# Patient Record
Sex: Female | Born: 1935
Health system: Southern US, Community
[De-identification: ages and names within clinical notes are randomized; demographics above are authoritative.]

## PROBLEM LIST (undated history)

## (undated) DIAGNOSIS — E785 Hyperlipidemia, unspecified: Secondary | ICD-10-CM

## (undated) DIAGNOSIS — Z9889 Other specified postprocedural states: Secondary | ICD-10-CM

## (undated) DIAGNOSIS — E559 Vitamin D deficiency, unspecified: Secondary | ICD-10-CM

## (undated) DIAGNOSIS — K644 Residual hemorrhoidal skin tags: Secondary | ICD-10-CM

## (undated) DIAGNOSIS — M1990 Unspecified osteoarthritis, unspecified site: Secondary | ICD-10-CM

## (undated) DIAGNOSIS — K625 Hemorrhage of anus and rectum: Secondary | ICD-10-CM

## (undated) DIAGNOSIS — H548 Legal blindness, as defined in USA: Secondary | ICD-10-CM

## (undated) DIAGNOSIS — Z9079 Acquired absence of other genital organ(s): Secondary | ICD-10-CM

## (undated) DIAGNOSIS — C801 Malignant (primary) neoplasm, unspecified: Secondary | ICD-10-CM

## (undated) DIAGNOSIS — M949 Disorder of cartilage, unspecified: Secondary | ICD-10-CM

## (undated) DIAGNOSIS — K5900 Constipation, unspecified: Secondary | ICD-10-CM

## (undated) DIAGNOSIS — R609 Edema, unspecified: Secondary | ICD-10-CM

## (undated) DIAGNOSIS — D649 Anemia, unspecified: Secondary | ICD-10-CM

## (undated) DIAGNOSIS — K219 Gastro-esophageal reflux disease without esophagitis: Secondary | ICD-10-CM

## (undated) DIAGNOSIS — M899 Disorder of bone, unspecified: Secondary | ICD-10-CM

## (undated) DIAGNOSIS — H547 Unspecified visual loss: Secondary | ICD-10-CM

## (undated) HISTORY — DX: Anemia, unspecified: D64.9

## (undated) HISTORY — DX: Gastro-esophageal reflux disease without esophagitis: K21.9

## (undated) HISTORY — PX: OTHER SURGICAL HISTORY: SHX169

## (undated) HISTORY — DX: Acquired absence of other genital organ(s): Z90.79

## (undated) HISTORY — DX: Disorder of cartilage, unspecified: M94.9

## (undated) HISTORY — PX: ROTATOR CUFF REPAIR: SHX139

## (undated) HISTORY — DX: Vitamin D deficiency, unspecified: E55.9

## (undated) HISTORY — DX: Legal blindness, as defined in USA: H54.8

## (undated) HISTORY — DX: Constipation, unspecified: K59.00

## (undated) HISTORY — DX: Disorder of bone, unspecified: M89.9

## (undated) HISTORY — DX: Other specified postprocedural states: Z98.89

## (undated) HISTORY — DX: Unspecified osteoarthritis, unspecified site: M19.90

## (undated) HISTORY — PX: TUBAL LIGATION: SHX77

## (undated) HISTORY — DX: Malignant (primary) neoplasm, unspecified: C80.1

## (undated) HISTORY — PX: RIGHT OOPHORECTOMY: SHX2359

## (undated) HISTORY — DX: Hemorrhage of anus and rectum: K62.5

## (undated) HISTORY — DX: Hyperlipidemia, unspecified: E78.5

## (undated) HISTORY — DX: Residual hemorrhoidal skin tags: K64.4

## (undated) HISTORY — PX: ABDOMINAL HYSTERECTOMY: SHX81

---

## 1999-01-09 ENCOUNTER — Other Ambulatory Visit: Admission: RE | Admit: 1999-01-09 | Discharge: 1999-01-09 | Payer: Self-pay | Admitting: Obstetrics and Gynecology

## 1999-01-16 ENCOUNTER — Ambulatory Visit (HOSPITAL_COMMUNITY): Admission: RE | Admit: 1999-01-16 | Discharge: 1999-01-16 | Payer: Self-pay | Admitting: Obstetrics and Gynecology

## 1999-01-16 ENCOUNTER — Encounter: Payer: Self-pay | Admitting: Obstetrics and Gynecology

## 1999-01-24 ENCOUNTER — Other Ambulatory Visit: Admission: RE | Admit: 1999-01-24 | Discharge: 1999-01-24 | Payer: Self-pay | Admitting: Obstetrics and Gynecology

## 1999-01-24 ENCOUNTER — Encounter (INDEPENDENT_AMBULATORY_CARE_PROVIDER_SITE_OTHER): Payer: Self-pay | Admitting: Specialist

## 2000-10-06 ENCOUNTER — Other Ambulatory Visit: Admission: RE | Admit: 2000-10-06 | Discharge: 2000-10-06 | Payer: Self-pay | Admitting: Obstetrics and Gynecology

## 2000-11-02 ENCOUNTER — Encounter: Payer: Self-pay | Admitting: Gastroenterology

## 2000-11-02 LAB — HM COLONOSCOPY

## 2001-06-21 ENCOUNTER — Inpatient Hospital Stay (HOSPITAL_COMMUNITY): Admission: RE | Admit: 2001-06-21 | Discharge: 2001-06-22 | Payer: Self-pay | Admitting: Orthopaedic Surgery

## 2004-08-31 ENCOUNTER — Emergency Department (HOSPITAL_COMMUNITY): Admission: EM | Admit: 2004-08-31 | Discharge: 2004-08-31 | Payer: Self-pay | Admitting: *Deleted

## 2004-09-03 ENCOUNTER — Emergency Department (HOSPITAL_COMMUNITY): Admission: EM | Admit: 2004-09-03 | Discharge: 2004-09-03 | Payer: Self-pay | Admitting: Emergency Medicine

## 2004-11-01 ENCOUNTER — Ambulatory Visit: Payer: Self-pay | Admitting: Internal Medicine

## 2004-11-05 ENCOUNTER — Ambulatory Visit: Payer: Self-pay | Admitting: Internal Medicine

## 2004-11-27 ENCOUNTER — Ambulatory Visit: Payer: Self-pay | Admitting: Internal Medicine

## 2004-12-10 ENCOUNTER — Ambulatory Visit: Payer: Self-pay | Admitting: Internal Medicine

## 2005-06-02 ENCOUNTER — Ambulatory Visit: Payer: Self-pay | Admitting: Internal Medicine

## 2005-08-22 HISTORY — PX: IRRIGATION AND DEBRIDEMENT SEBACEOUS CYST: SHX5255

## 2005-08-24 ENCOUNTER — Emergency Department (HOSPITAL_COMMUNITY): Admission: EM | Admit: 2005-08-24 | Discharge: 2005-08-24 | Payer: Self-pay | Admitting: Emergency Medicine

## 2005-08-25 ENCOUNTER — Ambulatory Visit: Payer: Self-pay | Admitting: Internal Medicine

## 2005-09-21 HISTORY — PX: OTHER SURGICAL HISTORY: SHX169

## 2005-10-07 ENCOUNTER — Ambulatory Visit: Payer: Self-pay

## 2005-12-02 ENCOUNTER — Ambulatory Visit: Payer: Self-pay | Admitting: Internal Medicine

## 2006-08-11 ENCOUNTER — Ambulatory Visit: Payer: Self-pay | Admitting: Internal Medicine

## 2006-08-11 LAB — CONVERTED CEMR LAB
ALT: 16 units/L (ref 0–40)
AST: 21 units/L (ref 0–37)
BUN: 19 mg/dL (ref 6–23)
Cholesterol: 262 mg/dL (ref 0–200)
Creatinine, Ser: 0.9 mg/dL (ref 0.4–1.2)
Direct LDL: 157.7 mg/dL
Glucose, Bld: 99 mg/dL (ref 70–99)
HDL: 53.9 mg/dL (ref >39.0)
Total CHOL/HDL Ratio: 4.9
Triglycerides: 164 mg/dL — ABNORMAL HIGH (ref 0–149)
VLDL: 33 mg/dL (ref 0–40)

## 2006-11-20 ENCOUNTER — Ambulatory Visit: Payer: Self-pay | Admitting: Internal Medicine

## 2006-12-14 DIAGNOSIS — Z9079 Acquired absence of other genital organ(s): Secondary | ICD-10-CM | POA: Insufficient documentation

## 2006-12-14 DIAGNOSIS — K5900 Constipation, unspecified: Secondary | ICD-10-CM

## 2006-12-14 DIAGNOSIS — Z9889 Other specified postprocedural states: Secondary | ICD-10-CM

## 2006-12-14 DIAGNOSIS — H548 Legal blindness, as defined in USA: Secondary | ICD-10-CM

## 2006-12-14 DIAGNOSIS — E785 Hyperlipidemia, unspecified: Secondary | ICD-10-CM | POA: Insufficient documentation

## 2006-12-14 DIAGNOSIS — K649 Unspecified hemorrhoids: Secondary | ICD-10-CM | POA: Insufficient documentation

## 2006-12-14 HISTORY — DX: Acquired absence of other genital organ(s): Z90.79

## 2006-12-14 HISTORY — DX: Legal blindness, as defined in USA: H54.8

## 2006-12-14 HISTORY — DX: Constipation, unspecified: K59.00

## 2006-12-14 HISTORY — DX: Hyperlipidemia, unspecified: E78.5

## 2006-12-14 HISTORY — DX: Other specified postprocedural states: Z98.89

## 2007-01-12 LAB — HM MAMMOGRAPHY: HM Mammogram: NORMAL

## 2007-05-21 ENCOUNTER — Ambulatory Visit: Payer: Self-pay | Admitting: Internal Medicine

## 2007-07-16 ENCOUNTER — Ambulatory Visit: Payer: Self-pay | Admitting: Internal Medicine

## 2007-07-16 DIAGNOSIS — R7989 Other specified abnormal findings of blood chemistry: Secondary | ICD-10-CM | POA: Insufficient documentation

## 2007-07-16 LAB — CONVERTED CEMR LAB
AST: 22 units/L (ref 0–37)
Alkaline Phosphatase: 61 units/L (ref 39–117)
Bilirubin, Direct: 0.1 mg/dL (ref 0.0–0.3)
Chloride: 108 meq/L (ref 96–112)
GFR calc non Af Amer: 75 mL/min
LDL Cholesterol: 104 mg/dL — ABNORMAL HIGH (ref 0–99)
Potassium: 3.9 meq/L (ref 3.5–5.1)
Sodium: 143 meq/L (ref 135–145)
Total Bilirubin: 0.8 mg/dL (ref 0.3–1.2)
Total CHOL/HDL Ratio: 3.3

## 2007-07-20 ENCOUNTER — Encounter: Payer: Self-pay | Admitting: Internal Medicine

## 2007-08-19 ENCOUNTER — Telehealth: Payer: Self-pay | Admitting: Gastroenterology

## 2007-09-30 DIAGNOSIS — K644 Residual hemorrhoidal skin tags: Secondary | ICD-10-CM

## 2007-09-30 HISTORY — DX: Residual hemorrhoidal skin tags: K64.4

## 2007-10-01 ENCOUNTER — Ambulatory Visit: Payer: Self-pay | Admitting: Gastroenterology

## 2007-10-01 DIAGNOSIS — K219 Gastro-esophageal reflux disease without esophagitis: Secondary | ICD-10-CM

## 2007-10-01 DIAGNOSIS — K625 Hemorrhage of anus and rectum: Secondary | ICD-10-CM

## 2007-10-01 HISTORY — DX: Hemorrhage of anus and rectum: K62.5

## 2007-10-01 HISTORY — DX: Gastro-esophageal reflux disease without esophagitis: K21.9

## 2007-10-01 LAB — CONVERTED CEMR LAB
Eosinophils Absolute: 0.1 10*3/uL (ref 0.0–0.7)
Eosinophils Relative: 2.1 % (ref 0.0–5.0)
Folate: >20 ng/mL
HCT: 33.8 % — ABNORMAL LOW (ref 36.0–46.0)
MCV: 85.1 fL (ref 78.0–100.0)
Monocytes Absolute: 0.4 10*3/uL (ref 0.1–1.0)
Platelets: 249 10*3/uL (ref 150–400)
RDW: 14.2 % (ref 11.5–14.6)
Transferrin: 220.3 mg/dL (ref >=212.0)
WBC: 4.8 10*3/uL (ref 4.5–10.5)

## 2007-10-13 ENCOUNTER — Encounter: Payer: Self-pay | Admitting: Gastroenterology

## 2007-10-13 ENCOUNTER — Ambulatory Visit: Payer: Self-pay | Admitting: Gastroenterology

## 2007-10-14 ENCOUNTER — Encounter: Payer: Self-pay | Admitting: Gastroenterology

## 2007-10-26 ENCOUNTER — Encounter: Payer: Self-pay | Admitting: Gastroenterology

## 2007-10-27 ENCOUNTER — Telehealth: Payer: Self-pay | Admitting: Gastroenterology

## 2007-11-16 ENCOUNTER — Telehealth: Payer: Self-pay | Admitting: Gastroenterology

## 2007-11-30 ENCOUNTER — Encounter: Payer: Self-pay | Admitting: Gastroenterology

## 2008-01-13 ENCOUNTER — Encounter: Payer: Self-pay | Admitting: Internal Medicine

## 2008-02-07 ENCOUNTER — Ambulatory Visit: Payer: Self-pay | Admitting: Internal Medicine

## 2008-02-07 DIAGNOSIS — M899 Disorder of bone, unspecified: Secondary | ICD-10-CM

## 2008-02-07 DIAGNOSIS — M949 Disorder of cartilage, unspecified: Secondary | ICD-10-CM

## 2008-02-07 HISTORY — DX: Disorder of bone, unspecified: M89.9

## 2008-02-07 LAB — CONVERTED CEMR LAB
Alkaline Phosphatase: 65 units/L (ref 39–117)
Basophils Absolute: 0.1 10*3/uL (ref 0.0–0.1)
Bilirubin, Direct: 0.1 mg/dL (ref 0.0–0.3)
Eosinophils Absolute: 0.1 10*3/uL (ref 0.0–0.7)
HDL: 52.7 mg/dL (ref >39.0)
MCHC: 32.8 g/dL (ref 30.0–36.0)
MCV: 86.7 fL (ref 78.0–100.0)
Neutrophils Relative %: 49.6 % (ref 43.0–77.0)
Platelets: 214 10*3/uL (ref 150–400)
RDW: 14.1 % (ref 11.5–14.6)
Total Bilirubin: 0.5 mg/dL (ref 0.3–1.2)
VLDL: 21 mg/dL (ref 0–40)
WBC: 5.6 10*3/uL (ref 4.5–10.5)

## 2008-02-08 ENCOUNTER — Telehealth: Payer: Self-pay | Admitting: Internal Medicine

## 2008-02-08 ENCOUNTER — Encounter: Payer: Self-pay | Admitting: Internal Medicine

## 2008-05-20 ENCOUNTER — Observation Stay (HOSPITAL_COMMUNITY): Admission: EM | Admit: 2008-05-20 | Discharge: 2008-05-21 | Payer: Self-pay | Admitting: Emergency Medicine

## 2008-06-22 ENCOUNTER — Telehealth (INDEPENDENT_AMBULATORY_CARE_PROVIDER_SITE_OTHER): Payer: Self-pay | Admitting: *Deleted

## 2008-06-26 ENCOUNTER — Telehealth: Payer: Self-pay | Admitting: Internal Medicine

## 2008-06-28 ENCOUNTER — Telehealth: Payer: Self-pay | Admitting: Internal Medicine

## 2008-07-04 ENCOUNTER — Telehealth: Payer: Self-pay | Admitting: Internal Medicine

## 2008-08-01 ENCOUNTER — Ambulatory Visit: Payer: Self-pay | Admitting: Internal Medicine

## 2008-08-01 LAB — CONVERTED CEMR LAB
HDL: 53.6 mg/dL (ref >39.00)
Total Bilirubin: 0.5 mg/dL (ref 0.3–1.2)
Total CHOL/HDL Ratio: 4
VLDL: 27.6 mg/dL (ref 0.0–40.0)

## 2008-08-22 ENCOUNTER — Encounter: Payer: Self-pay | Admitting: Internal Medicine

## 2008-10-09 ENCOUNTER — Telehealth: Payer: Self-pay | Admitting: Internal Medicine

## 2008-11-08 ENCOUNTER — Ambulatory Visit (HOSPITAL_COMMUNITY): Admission: RE | Admit: 2008-11-08 | Discharge: 2008-11-08 | Payer: Self-pay | Admitting: Obstetrics and Gynecology

## 2008-11-08 ENCOUNTER — Encounter (INDEPENDENT_AMBULATORY_CARE_PROVIDER_SITE_OTHER): Payer: Self-pay | Admitting: Obstetrics and Gynecology

## 2008-11-22 ENCOUNTER — Encounter: Payer: Self-pay | Admitting: Internal Medicine

## 2009-01-18 ENCOUNTER — Encounter: Payer: Self-pay | Admitting: Internal Medicine

## 2009-01-24 ENCOUNTER — Telehealth: Payer: Self-pay | Admitting: Internal Medicine

## 2009-01-25 DIAGNOSIS — E559 Vitamin D deficiency, unspecified: Secondary | ICD-10-CM

## 2009-01-25 HISTORY — DX: Vitamin D deficiency, unspecified: E55.9

## 2009-02-06 ENCOUNTER — Ambulatory Visit: Payer: Self-pay | Admitting: Internal Medicine

## 2009-02-06 LAB — CONVERTED CEMR LAB
ALT: 15 units/L (ref 0–35)
Albumin: 3.9 g/dL (ref 3.5–5.2)
Alkaline Phosphatase: 66 units/L (ref 39–117)
Basophils Absolute: 0.1 10*3/uL (ref 0.0–0.1)
CO2: 28 meq/L (ref 19–32)
Calcium, Total (PTH): 9.2 mg/dL (ref 8.4–10.5)
Chloride: 107 meq/L (ref 96–112)
Cholesterol: 193 mg/dL (ref 0–200)
Creatinine, Ser: 0.8 mg/dL (ref 0.4–1.2)
Hemoglobin: 11.4 g/dL — ABNORMAL LOW (ref 12.0–15.0)
LDL Cholesterol: 113 mg/dL — ABNORMAL HIGH (ref 0–99)
Lymphocytes Relative: 38.2 % (ref 12.0–46.0)
Monocytes Relative: 5.8 % (ref 3.0–12.0)
Neutro Abs: 2.5 10*3/uL (ref 1.4–7.7)
Neutrophils Relative %: 53.1 % (ref 43.0–77.0)
PTH: 68.5 pg/mL (ref 14.0–72.0)
Potassium: 4.1 meq/L (ref 3.5–5.1)
RBC: 3.87 M/uL (ref 3.87–5.11)
RDW: 13.4 % (ref 11.5–14.6)
Total Protein: 7.3 g/dL (ref 6.0–8.3)
Triglycerides: 119 mg/dL (ref 0.0–149.0)
VLDL: 23.8 mg/dL (ref 0.0–40.0)

## 2009-02-08 ENCOUNTER — Encounter: Payer: Self-pay | Admitting: Internal Medicine

## 2009-03-12 ENCOUNTER — Ambulatory Visit: Payer: Self-pay | Admitting: Endocrinology

## 2009-03-12 LAB — CONVERTED CEMR LAB: Vit D, 25-Hydroxy: 26 ng/mL — ABNORMAL LOW (ref 30–89)

## 2009-06-01 ENCOUNTER — Telehealth: Payer: Self-pay | Admitting: Endocrinology

## 2009-06-07 ENCOUNTER — Ambulatory Visit: Payer: Self-pay | Admitting: Endocrinology

## 2009-06-08 LAB — CONVERTED CEMR LAB: Vit D, 25-Hydroxy: 30 ng/mL (ref 30–89)

## 2009-09-04 ENCOUNTER — Ambulatory Visit: Payer: Self-pay | Admitting: Internal Medicine

## 2009-09-04 LAB — CONVERTED CEMR LAB
AST: 24 units/L (ref 0–37)
Albumin: 4.4 g/dL (ref 3.5–5.2)
Basophils Relative: 0.7 % (ref 0.0–3.0)
CO2: 29 meq/L (ref 19–32)
Calcium: 9.7 mg/dL (ref 8.4–10.5)
Chloride: 105 meq/L (ref 96–112)
Cholesterol: 210 mg/dL — ABNORMAL HIGH (ref 0–200)
Creatinine, Ser: 0.9 mg/dL (ref 0.4–1.2)
Direct LDL: 133.2 mg/dL
Eosinophils Relative: 1.6 % (ref 0.0–5.0)
HDL: 66.6 mg/dL (ref >39.00)
Lymphocytes Relative: 32.1 % (ref 12.0–46.0)
MCV: 85.1 fL (ref 78.0–100.0)
Monocytes Relative: 7.8 % (ref 3.0–12.0)
Neutrophils Relative %: 57.8 % (ref 43.0–77.0)
Platelets: 236 10*3/uL (ref 150.0–400.0)
RBC: 4.06 M/uL (ref 3.87–5.11)
Sodium: 142 meq/L (ref 135–145)
TSH: 0.66 microintl units/mL (ref 0.35–5.50)
Total Bilirubin: 0.5 mg/dL (ref 0.3–1.2)
Total CHOL/HDL Ratio: 3
Triglycerides: 122 mg/dL (ref 0.0–149.0)
WBC: 5.7 10*3/uL (ref 4.5–10.5)

## 2010-02-13 ENCOUNTER — Encounter: Payer: Self-pay | Admitting: Internal Medicine

## 2010-04-23 NOTE — Assessment & Plan Note (Signed)
Summary: CPX/MEDICARE/#/CD   Vital Signs:  Patient profile:   77 year old female Height:      63 inches Weight:      185 pounds BMI:     32.89 O2 Sat:      97 % on Room air Temp:     97.2 degrees F oral Pulse rate:   88 / minute BP sitting:   102 / 70  (left arm) Cuff size:   large  Vitals Entered By: Bill Salinas CMA (September 04, 2009 1:42 PM)  O2 Flow:  Room air  Primary Care Provider:  Illene Regulus, M.D.   History of Present Illness: Patient presents for annual exam. She saw Dr. Ambrose Mantle last year. She usually gets her mammogram in the fall. She has been feeling well. No report of falls at home, although she does have leg pain. She will have pain in the distal LE left when she lays on the left side. No interval illness or sickness otherwise.   Current Medications (verified): 1)  Pravastatin Sodium 40 Mg Tabs (Pravastatin Sodium) .Marland Kitchen.. 1 At Bedtime 2)  Ranitidine Hcl 150 Mg Tabs (Ranitidine Hcl) .... Take 1 Tablet By Mouth Two Times A Day 3)  Hydrochlorothiazide 25 Mg  Tabs (Hydrochlorothiazide) .Marland Kitchen.. 1 Tablet By Mouth As Needed 4)  Allegra 180 Mg  Tabs (Fexofenadine Hcl) .Marland Kitchen.. 1 Tablet By Mouth As Needed 5)  Advil 200 Mg  Tabs (Ibuprofen) .Marland Kitchen.. 1 Tablet By Mouth As Needed 6)  Nexium 40 Mg  Cpdr (Esomeprazole Magnesium) .Marland Kitchen.. 1 Tablet By Mouth As Needed  Allergies (verified): No Known Drug Allergies  Past History:  Past Medical History: Last updated: 07/16/2007 CONSTIPATION (ICD-564.00) HEMORRHOIDS (ICD-455.6) HYPERLIPIDEMIA (ICD-272.4) BLINDNESS, LEGAL, [place] DEFINITION (ICD-369.4)  LE Art Doppler '98 - normal Nuc Stres test July '07 - normal study    Past Surgical History: Last updated: 10/01/2007 ROTATOR CUFF REPAIR, RIGHT, HX OF (ICD-V45.89) OOPHORECTOMY, RIGHT, HX OF (ICD-V45.77) Infected sebaceous cyst drained June '07 (gerkin) Tubal Ligation  Family History: Last updated: 04/09/09 father - deceased @ 64: homocide mother - deceased @71 : "nautral causes," HTN,  dementia Neg-breast or colon cancer, CAD dtr is vitamin-d deficient  Social History: Last updated: 10/01/2007 HSG married '61 3 sons - '62, '64, '65; 2 daughters- '60, '66: 5 grandchildren lives with husband support family-close at hand Patient has never smoked.  Alcohol Use - no  Risk Factors: Exercise: no (07/16/2007)  Risk Factors: Smoking Status: never (10/01/2007)  Review of Systems       The patient complains of peripheral edema.  The patient denies anorexia, fever, weight loss, weight gain, decreased hearing, hoarseness, chest pain, syncope, dyspnea on exertion, prolonged cough, hemoptysis, abdominal pain, severe indigestion/heartburn, incontinence, muscle weakness, difficulty walking, unusual weight change, enlarged lymph nodes, and angioedema.    Physical Exam  General:  overweight AA female in no distress Head:  normocephalic, atraumatic, and no abnormalities observed.   Eyes:  blind Ears:  hearing is normal Mouth:  native dentition in good repair. No oral lesions Neck:  supple, full ROM, and no thyromegaly.   Chest Wall:  no deformities and no tenderness.   Breasts:  deferred to gyn Lungs:  normal respiratory effort, no intercostal retractions, normal breath sounds, no crackles, and no wheezes.   Heart:  normal rate, regular rhythm, no murmur, no gallop, and no JVD.   Abdomen:  soft, non-tender, normal bowel sounds, no guarding, and no rigidity.   Genitalia:  deferred to gyn Msk:  no joint  tenderness, no joint swelling, no joint warmth, no redness over joints, and no joint deformities.   Pulses:  2+ radial pulses; 1+ DP pulses Extremities:  No clubbing, cyanosis, edema, or deformity noted with normal full range of motion of all joints.   Neurologic:  alert & oriented X3, cranial nerves II-XII intact, strength normal in all extremities, and gait normal.   Skin:  turgor normal, color normal, no rashes, no ecchymoses, and no ulcerations.   Cervical Nodes:  no  anterior cervical adenopathy and no posterior cervical adenopathy.   Psych:  Oriented X3, memory intact for recent and remote, and normally interactive.     Impression & Recommendations:  Problem # 1:  HYPERLIPIDEMIA (ICD-272.4) Due for routine labs with recommendations to follow.  Her updated medication list for this problem includes:    Simvastatin 40 Mg Tabs (Simvastatin) .Marland Kitchen... 1 by mouth qpm  Orders: TLB-Lipid Panel (80061-LIPID) TLB-Hepatic/Liver Function Pnl (80076-HEPATIC) TLB-TSH (Thyroid Stimulating Hormone) (84443-TSH)  Addendum - LDL 133.2 - close to goal of 130 or less  Plan - will change to simvastatin 40mg  at next Rx refill.  Problem # 2:  Preventive Health Care (ICD-V70.0) Patient is current with gyn. s due for mammogram with last study in '08 - she is made aware of the need to schedule study. She is current with colonoscopy with last study in '09. She is current for pneumonia vaccine and is a candidate for shingles vaccine.   She has a stable home environement and a very supportive family. She has increased fall risk due to blindness but has been doing well. She does not get adequate exercise but does the best she can.  In summary - a very nice woman who is medically stable. She will return as needed or 1 year.   Complete Medication List: 1)  Simvastatin 40 Mg Tabs (Simvastatin) .Marland Kitchen.. 1 by mouth qpm 2)  Ranitidine Hcl 150 Mg Tabs (Ranitidine hcl) .... Take 1 tablet by mouth two times a day 3)  Hydrochlorothiazide 25 Mg Tabs (Hydrochlorothiazide) .Marland Kitchen.. 1 tablet by mouth as needed 4)  Allegra 180 Mg Tabs (Fexofenadine hcl) .Marland Kitchen.. 1 tablet by mouth as needed 5)  Advil 200 Mg Tabs (Ibuprofen) .Marland Kitchen.. 1 tablet by mouth as needed 6)  Nexium 40 Mg Cpdr (Esomeprazole magnesium) .Marland Kitchen.. 1 tablet by mouth as needed  Other Orders: Subsequent annual wellness visit with prevention plan (Z6109) TLB-CBC Platelet - w/Differential (85025-CBCD) TLB-BMP (Basic Metabolic Panel-BMET)  (80048-METABOL)  Patient: [name] Note: All result statuses are Final unless otherwise noted.  Tests: (1) CBC Platelet w/Diff (CBCD)   White Cell Count          5.7 K/uL                    4.5-10.5   Red Cell Count            4.06 Mil/uL                 3.87-5.11   Hemoglobin           [L]  11.8 g/dL                   60.4-54.0   Hematocrit           [L]  34.6 %                      36.0-46.0   MCV  85.1 fl                     78.0-100.0   MCHC                      34.0 g/dL                   16.1-09.6   RDW                       14.6 %                      11.5-14.6   Platelet Count            236.0 K/uL                  150.0-400.0   Neutrophil %              57.8 %                      43.0-77.0   Lymphocyte %              32.1 %                      12.0-46.0   Monocyte %                7.8 %                       3.0-12.0   Eosinophils%              1.6 %                       0.0-5.0   Basophils %               0.7 %                       0.0-3.0   Neutrophill Absolute      3.3 K/uL                    1.4-7.7   Lymphocyte Absolute       1.8 K/uL                    0.7-4.0   Monocyte Absolute         0.4 K/uL                    0.1-1.0  Eosinophils, Absolute                             0.1 K/uL                    0.0-0.7   Basophils Absolute        0.0 K/uL                    0.0-0.1  Tests: (2) Lipid Panel (LIPID)   Cholesterol          [H]  210 mg/dL                   0-454     ATP III Classification  Desirable:  < 200 mg/dL                    Borderline High:  200 - 239 mg/dL               High:  > = 240 mg/dL   Triglycerides             122.0 mg/dL                 1.6-109.6     Normal:  <150 mg/dL     Borderline High:  045 - 199 mg/dL   HDL                       40.98 mg/dL                 >11.91   VLDL Cholesterol          24.4 mg/dL                  4.7-82.9  CHO/HDL Ratio:  CHD Risk                             3                     Men          Women     1/2 Average Risk     3.4          3.3     Average Risk          5.0          4.4     2X Average Risk          9.6          7.1     3X Average Risk          15.0          11.0                           Tests: (3) Hepatic/Liver Function Panel (HEPATIC)   Total Bilirubin           0.5 mg/dL                   5.6-2.1   Direct Bilirubin          0.1 mg/dL                   3.0-8.6   Alkaline Phosphatase      68 U/L                      39-117   AST                       24 U/L                      0-37   ALT                       16 U/L                      0-35   Total Protein             7.9 g/dL  6.0-8.3   Albumin                   4.4 g/dL                    2.5-3.6  Tests: (4) TSH (TSH)   FastTSH                   0.66 uIU/mL                 0.35-5.50  Tests: (5) BMP (METABOL)   Sodium                    142 mEq/L                   135-145   Potassium                 4.7 mEq/L                   3.5-5.1   Chloride                  105 mEq/L                   96-112   Carbon Dioxide            29 mEq/L                    19-32   Glucose              [H]  105 mg/dL                   64-40   BUN                       20 mg/dL                    3-47   Creatinine                0.9 mg/dL                   4.2-5.9   Calcium                   9.7 mg/dL                   5.6-38.7   GFR                       75.78 mL/min                >60  Tests: (6) Cholesterol LDL - Direct (DIRLDL)  Cholesterol LDL - Direct                             133.2 mg/dL     Optimal:  <564 mg/dL     Near or Above Optimal:  100-129 mg/dL     Borderline High:  332-951 mg/dL     High:  884-166 mg/dL     Very High:  >063 mg/dLPrescriptions: SIMVASTATIN 40 MG TABS (SIMVASTATIN) 1 by mouth qPM  #30 x 12   Entered and Authorized by:   Jacques Navy MD   Signed by:   Jacques Navy MD on 09/05/2009   Method used:   [facility] to  [facility] [address]  [phone]* (retail)       [address]       [place], [place]  [address]       Ph: [phone] or [phone]       Fax: [phone]   RxID:   [id number] NEXIUM 40 MG  CPDR (ESOMEPRAZOLE MAGNESIUM) 1 tablet by mouth as needed  #30 x 12   Entered and Authorized by:   Jacques Navy MD   Signed by:   Jacques Navy MD on 09/04/2009   Method used:   [facility]        [facility]  [address] [phone]* (retail)       [address]       [place], [place]  [address]       Ph: [phone] or [phone]       Fax: [phone]   RxID:   [id number] HYDROCHLOROTHIAZIDE 25 MG  TABS (HYDROCHLOROTHIAZIDE) 1 tablet by mouth as needed  #30 x 12   Entered and Authorized by:   Jacques Navy MD   Signed by:   Jacques Navy MD on 09/04/2009   Method used:   [facility]        [facility]  [address] [phone]* (retail)       [address]       [place], [place]  [address]       Ph: [phone] or [phone]       Fax: [phone]   RxID:   [id number] RANITIDINE HCL 150 MG TABS (RANITIDINE HCL) Take 1 tablet by mouth two times a day  #60 x PRN   Entered and Authorized by:   Jacques Navy MD   Signed by:   Jacques Navy MD on 09/04/2009   Method used:   [facility] to        [facility]  [address] [phone]* (retail)       [address]       [place], [place]  [address]       Ph: [phone] or [phone]       Fax: [phone]   RxID:   [id number] PRAVASTATIN SODIUM 40 MG TABS (PRAVASTATIN SODIUM) 1 at bedtime  #30 Tablet x 5   Entered and Authorized by:   Jacques Navy MD   Signed by:   Jacques Navy MD on 09/04/2009   Method used:   [facility]        [facility]  [address] [phone]* (retail)       [address]       [place], [place]  [address]       Ph: [phone] or [phone]       Fax:  [phone]   RxID:   [id number]

## 2010-04-23 NOTE — Assessment & Plan Note (Signed)
Summary: F/U APPT,PT STATES SHE DOES NOT NEED CPX/$50/CD   Vital Signs:  Patient Profile:   77 Years Old Female Height:     63 inches Weight:      181 pounds Temp:     97.9 degrees F oral Pulse rate:   74 / minute BP sitting:   136 / 82  (left arm) Cuff size:   large  Vitals Entered By: Zackery Barefoot CMA (February 07, 2008 10:38 AM)                 PCP:  Illene Regulus, M.D.  Chief Complaint:  Follow up.  History of Present Illness: [name] is a 77 yo female with a h/o hyperlipidemia, blindness, and hypertension who presents for routine checkup and for discussion of her medications.  She complains of muscle aches and pains as well as headaches that she associates with her simvastatin.  She has halved her dose from 80mg  once daily to 40mg , without any change in her symptoms.  She doesn't know when the symptoms began, but feels they are worse in the morning after taking her statin, and also worse at night when she goes to bed.    Otherwise, she has no complaints.  She recently had a bone density scan, and her gynecologist recommended a Vit D/Ca level.  The can revealed very mild interval progression of her osteopenia.  She is not currently taking any Vitamin D, but is taking one TUMS tablet daily.  Patient does not want a CPX today.    Prior Medications Reviewed Using: List Brought by Patient  Updated Prior Medication List: SIMVASTATIN 80 MG  TABS (SIMVASTATIN) 1/2 by mouth qPM ZANTAC 75 75 MG  TABS (RANITIDINE HCL) 1 tablet po as needed HYDROCHLOROTHIAZIDE 25 MG  TABS (HYDROCHLOROTHIAZIDE) 1 tablet by mouth as needed ALLEGRA 180 MG  TABS (FEXOFENADINE HCL) 1 tablet by mouth as needed ADVIL 200 MG  TABS (IBUPROFEN) 1 tablet by mouth as needed NEXIUM 40 MG  CPDR (ESOMEPRAZOLE MAGNESIUM) 1 tablet by mouth as needed  Current Allergies (reviewed today): No known allergies       Physical Exam  General:     Well-developed,well-nourished,in no acute distress;  alert,appropriate and cooperative throughout examination Head:     Normocephalic and atraumatic without obvious abnormalities. No apparent alopecia or balding. Eyes:     vision impaired bilaterally. Psych:     Cognition and judgment appear intact. Alert and cooperative with normal attention span and concentration. No apparent delusions, illusions, hallucinations    Impression & Recommendations:  Problem # 1:  HYPERLIPIDEMIA (ICD-272.4) Patient reports intolerable side effects of simvastatin including muscle and joint pain, and headaches.  She halved her dose to 40mg , with no improvement in her symptoms.  Symptoms are unlikely to be related to simvastatin.  Plan:  Patient was instructed to stop taking her Simvastatin, and we will monitor for improvement in her symptoms.  If no improvement occurs, we will restart simvastatin.  Additionally, she will have Lipid panel and LFTs checked.  Her updated medication list for this problem includes:    Simvastatin 80 Mg Tabs (Simvastatin) .Marland Kitchen... 1/2 by mouth qpm  Orders: TLB-Lipid Panel (80061-LIPID) TLB-Hepatic/Liver Function Pnl (80076-HEPATIC)   Problem # 2:  OSTEOPENIA (ICD-733.90) Patient had a recent bone scan that showed very mild interval progression of her osteopenia, particularly in the Left hip.  She has not been taking Vit D, and minimal Calcium daily.  Her gynecologist wanted to check Vit D levels.  This test would not change our management of the patient's condition, so we will forego the test, treat empirically, and if there is no response, we may consider the test in the future.  Plan:  Patient was instructed to take Vit D 400 units two times a day, and Calcium 1200mg  daily.  Caltrate with D is a good choice because it is a combination pill and will provide the correct amount of both Vit D and Calcium that she needs.  For constipation, the patient may take Milk of Magnesia once daily as needed.  Complete Medication List: 1)   Simvastatin 80 Mg Tabs (Simvastatin) .... 1/2 by mouth qpm 2)  Zantac 75 75 Mg Tabs (Ranitidine hcl) .Marland Kitchen.. 1 tablet po as needed 3)  Hydrochlorothiazide 25 Mg Tabs (Hydrochlorothiazide) .Marland Kitchen.. 1 tablet by mouth as needed 4)  Allegra 180 Mg Tabs (Fexofenadine hcl) .Marland Kitchen.. 1 tablet by mouth as needed 5)  Advil 200 Mg Tabs (Ibuprofen) .Marland Kitchen.. 1 tablet by mouth as needed 6)  Nexium 40 Mg Cpdr (Esomeprazole magnesium) .Marland Kitchen.. 1 tablet by mouth as needed  Other Orders: TLB-CBC Platelet - w/Differential (85025-CBCD)    ]

## 2010-04-23 NOTE — Progress Notes (Signed)
Summary: Vit D  Phone Note Call from Patient Call back at Home Phone [phone]   Summary of Call: pt called requesting reading of Vit D and she would also like to know how long she is supposed to take Vit D supplements Initial call taken by: Margaret Pyle, CMA,  June 01, 2009 1:19 PM  Follow-up for Phone Call        are you still taking the 50,000 units per week? come in for vit-d level  268.9 Follow-up by: Minus Breeding MD,  June 01, 2009 1:54 PM  Additional Follow-up for Phone Call Additional follow up Details #1::        pt informed and is currently still taking Vit D 50,000u q week. pt sch for labs 06/04/2009 Additional Follow-up by: Margaret Pyle, CMA,  June 01, 2009 2:15 PM

## 2010-05-14 ENCOUNTER — Ambulatory Visit: Payer: Self-pay | Admitting: Internal Medicine

## 2010-05-23 ENCOUNTER — Ambulatory Visit (INDEPENDENT_AMBULATORY_CARE_PROVIDER_SITE_OTHER): Payer: PRIVATE HEALTH INSURANCE | Admitting: Internal Medicine

## 2010-05-23 ENCOUNTER — Encounter: Payer: Self-pay | Admitting: Internal Medicine

## 2010-05-23 DIAGNOSIS — E785 Hyperlipidemia, unspecified: Secondary | ICD-10-CM

## 2010-05-23 DIAGNOSIS — K219 Gastro-esophageal reflux disease without esophagitis: Secondary | ICD-10-CM

## 2010-05-30 NOTE — Assessment & Plan Note (Signed)
Summary: 6 mth fu/lb   Vital Signs:  Patient profile:   77 year old female Height:      63 inches Weight:      195 pounds BMI:     34.67 O2 Sat:      98 % on Room air Temp:     97.8 degrees F oral Pulse rate:   86 / minute BP sitting:   110 / 72  (left arm) Cuff size:   large  Vitals Entered By: Ami Bullins CMA (May 23, 2010 9:47 AM)  O2 Flow:  Room air CC: 6 month follow up/ ab   Primary Care Provider:  Illene Regulus, M.D.  CC:  6 month follow up/ ab.  History of Present Illness: [name] presents for 6 month follow up with last visit in June '11. In the interval she had a normal mammogram in Nov '11. She had lab work at her last visit which was normal except for slight elevation in LDL at 133.   In the interval she has been well with no intercurrent illness or problems. She has good days and bad days.   Current Medications (verified): 1)  Simvastatin 40 Mg Tabs (Simvastatin) .Marland Kitchen.. 1 By Mouth Qpm 2)  Ranitidine Hcl 150 Mg Tabs (Ranitidine Hcl) .... Take 1 Tablet By Mouth Two Times A Day 3)  Hydrochlorothiazide 25 Mg  Tabs (Hydrochlorothiazide) .Marland Kitchen.. 1 Tablet By Mouth As Needed 4)  Allegra 180 Mg  Tabs (Fexofenadine Hcl) .Marland Kitchen.. 1 Tablet By Mouth As Needed 5)  Advil 200 Mg  Tabs (Ibuprofen) .Marland Kitchen.. 1 Tablet By Mouth As Needed 6)  Nexium 40 Mg  Cpdr (Esomeprazole Magnesium) .Marland Kitchen.. 1 Tablet By Mouth As Needed  Allergies (verified): No Known Drug Allergies  Past History:  Past Medical History: Last updated: 07/16/2007 CONSTIPATION (ICD-564.00) HEMORRHOIDS (ICD-455.6) HYPERLIPIDEMIA (ICD-272.4) BLINDNESS, LEGAL, [place] DEFINITION (ICD-369.4)  LE Art Doppler '98 - normal Nuc Stres test July '07 - normal study    Past Surgical History: Last updated: 10/01/2007 ROTATOR CUFF REPAIR, RIGHT, HX OF (ICD-V45.89) OOPHORECTOMY, RIGHT, HX OF (ICD-V45.77) Infected sebaceous cyst drained June '07 (gerkin) Tubal Ligation FH reviewed for relevance, SH/Risk Factors reviewed for  relevance  Review of Systems  The patient denies anorexia, fever, weight loss, weight gain, decreased hearing, chest pain, syncope, dyspnea on exertion, abdominal pain, severe indigestion/heartburn, incontinence, unusual weight change, enlarged lymph nodes, and angioedema.         Patient is blind. She has been taking Benefiber and her bowel habit has been better.   Physical Exam  General:  WNWND AA female in no distress Head:  Normocephalic and atraumatic without obvious abnormalities. No apparent alopecia or balding. Ears:  mild cerumen buildup both canals Neck:  supple, full ROM, and no thyromegaly.   Lungs:  normal respiratory effort and normal breath sounds.   Heart:  normal rate and regular rhythm.  II/VI murmur at RSB Abdomen:  soft, non-tender, and normal bowel sounds.   Msk:  normal ROM, no joint tenderness, and no joint swelling.   Pulses:  2+ radial pulse and right DP pulse Neurologic:  alert & oriented X3.  Blind. MS normal Skin:  turgor normal, color normal, no rashes, and no ulcerations.   Cervical Nodes:  no anterior cervical adenopathy and no posterior cervical adenopathy.   Psych:  Oriented X3, memory intact for recent and remote, and normally interactive.     Impression & Recommendations:  Problem # 1:  GERD (ICD-530.81) Stable on her present  regimen.  Her updated medication list for this problem includes:    Ranitidine Hcl 150 Mg Tabs (Ranitidine hcl) .Marland Kitchen... Take 1 tablet by mouth two times a day    Nexium 40 Mg Cpdr (Esomeprazole magnesium) .Marland Kitchen... 1 tablet by mouth as needed  Problem # 2:  HYPERLIPIDEMIA (ICD-272.4)  Her updated medication list for this problem includes:    Simvastatin 40 Mg Tabs (Simvastatin) .Marland Kitchen... 1 by mouth qpm  Labs Reviewed: SGOT: 24 (09/04/2009)   SGPT: 16 (09/04/2009)  Good control of lipids at last lab in June '11. No due for follow up lab At this visit.  Plan - continue present medications.  Complete Medication List: 1)   Simvastatin 40 Mg Tabs (Simvastatin) .Marland Kitchen.. 1 by mouth qpm 2)  Ranitidine Hcl 150 Mg Tabs (Ranitidine hcl) .... Take 1 tablet by mouth two times a day 3)  Hydrochlorothiazide 25 Mg Tabs (Hydrochlorothiazide) .Marland Kitchen.. 1 tablet by mouth as needed 4)  Allegra 180 Mg Tabs (Fexofenadine hcl) .Marland Kitchen.. 1 tablet by mouth as needed 5)  Advil 200 Mg Tabs (Ibuprofen) .Marland Kitchen.. 1 tablet by mouth as needed 6)  Nexium 40 Mg Cpdr (Esomeprazole magnesium) .Marland Kitchen.. 1 tablet by mouth as needed Prescriptions: HYDROCHLOROTHIAZIDE 25 MG  TABS (HYDROCHLOROTHIAZIDE) 1 tablet by mouth as needed  #30 x 12   Entered and Authorized by:   Jacques Navy MD   Signed by:   Jacques Navy MD on 05/23/2010   Method used:   [facility]        [facility] [address] [phone]* (retail)       [address]       [place], [place]  [id number]       Ph: [phone] or [phone]       Fax: [phone]   RxID:   [id number]    Orders Added: 1)  Est. Patient Level III [40347]

## 2010-06-29 LAB — DIFFERENTIAL
Basophils Absolute: 0 10*3/uL (ref 0.0–0.1)
Basophils Relative: 1 % (ref 0–1)
Lymphocytes Relative: 40 % (ref 12–46)
Neutro Abs: 2.4 10*3/uL (ref 1.7–7.7)
Neutrophils Relative %: 50 % (ref 43–77)

## 2010-06-29 LAB — COMPREHENSIVE METABOLIC PANEL
Albumin: 3.8 g/dL (ref 3.5–5.2)
Alkaline Phosphatase: 58 U/L (ref 39–117)
BUN: 15 mg/dL (ref 6–23)
Chloride: 104 mEq/L (ref 96–112)
Creatinine, Ser: 0.81 mg/dL (ref 0.4–1.2)
GFR calc non Af Amer: >60 mL/min (ref >60)
Glucose, Bld: 88 mg/dL (ref 70–99)
Potassium: 3.7 mEq/L (ref 3.5–5.1)
Total Bilirubin: 0.6 mg/dL (ref 0.3–1.2)

## 2010-06-29 LAB — CBC
HCT: 32.9 % — ABNORMAL LOW (ref 36.0–46.0)
Hemoglobin: 10.9 g/dL — ABNORMAL LOW (ref 12.0–15.0)
MCV: 87.6 fL (ref 78.0–100.0)
Platelets: 205 10*3/uL (ref 150–400)
RDW: 14.1 % (ref 11.5–15.5)
WBC: 4.7 10*3/uL (ref 4.0–10.5)

## 2010-07-05 ENCOUNTER — Other Ambulatory Visit: Payer: Self-pay | Admitting: Internal Medicine

## 2010-07-09 LAB — CBC
HCT: 33.6 % — ABNORMAL LOW (ref 36.0–46.0)
HCT: 33.9 % — ABNORMAL LOW (ref 36.0–46.0)
Hemoglobin: 11.3 g/dL — ABNORMAL LOW (ref 12.0–15.0)
Hemoglobin: 11.5 g/dL — ABNORMAL LOW (ref 12.0–15.0)
MCHC: 33.8 g/dL (ref 30.0–36.0)
MCV: 86.8 fL (ref 78.0–100.0)
RBC: 3.93 MIL/uL (ref 3.87–5.11)
WBC: 5.6 10*3/uL (ref 4.0–10.5)

## 2010-07-09 LAB — CK TOTAL AND CKMB (NOT AT ARMC)
CK, MB: 1.2 ng/mL (ref 0.3–4.0)
Total CK: 108 U/L (ref 7–177)

## 2010-07-09 LAB — URINALYSIS, ROUTINE W REFLEX MICROSCOPIC
Bilirubin Urine: NEGATIVE
Nitrite: NEGATIVE
Specific Gravity, Urine: 1.007 (ref 1.005–1.030)
Urobilinogen, UA: 0.2 mg/dL (ref 0.0–1.0)
pH: 7 (ref 5.0–8.0)

## 2010-07-09 LAB — CARDIAC PANEL(CRET KIN+CKTOT+MB+TROPI)
CK, MB: 1.2 ng/mL (ref 0.3–4.0)
Relative Index: 1 (ref 0.0–2.5)
Relative Index: 1 (ref 0.0–2.5)
Total CK: 121 U/L (ref 7–177)

## 2010-07-09 LAB — URINE CULTURE: Colony Count: 15000

## 2010-07-09 LAB — POCT CARDIAC MARKERS
CKMB, poc: 1.3 ng/mL (ref 1.0–8.0)
Myoglobin, poc: 128 ng/mL (ref 12–200)

## 2010-07-09 LAB — URINE MICROSCOPIC-ADD ON

## 2010-07-09 LAB — BASIC METABOLIC PANEL
CO2: 26 mEq/L (ref 19–32)
Calcium: 9.2 mg/dL (ref 8.4–10.5)
GFR calc Af Amer: >60 mL/min (ref >60)
Glucose, Bld: 94 mg/dL (ref 70–99)
Potassium: 3.6 mEq/L (ref 3.5–5.1)
Sodium: 135 mEq/L (ref 135–145)

## 2010-07-09 LAB — TROPONIN I: Troponin I: <0.01 ng/mL (ref 0.00–0.06)

## 2010-07-09 LAB — D-DIMER, QUANTITATIVE: D-Dimer, Quant: 0.74 ug/mL-FEU — ABNORMAL HIGH (ref 0.00–0.48)

## 2010-08-06 NOTE — Discharge Summary (Signed)
NAMECARMELINE, KOWAL             ACCOUNT NO.:  [id number]   MEDICAL RECORD NO.:  [id number]          PATIENT TYPE:  INP   LOCATION:  3735                         FACILITY:  [facility]   PHYSICIAN:  Titus Dubin. Hopper, MD,FACP,FCCPDATE OF BIRTH:  02-12-1936   DATE OF ADMISSION:  05/20/2008  DATE OF DISCHARGE:  05/21/2008                               DISCHARGE SUMMARY   ADMITTING DIAGNOSIS:  Chest pain with an elevated D-dimer.   DISCHARGE DIAGNOSIS:  Chest pain with an elevated D-dimer.   BRIEF HISTORY:  Ms. Chouinard had 2 separate episodes of left lateral  thoracic pain early on May 19, 2008, and May 20, 2008.  This  was in the context of a known hiatal hernia, for which she has taken  Zantac 75 as needed.   EKG revealed nonspecific changes.  D-dimer was elevated at 0.74, but the  pulmonary angiogram was negative.   Serial CPK and MB and troponin I were normal x3.  Chest x-ray revealed  cardiomegaly but no active disease.   The morning of discharge, the patient denied any chest pain.  Vital  signs were stable.  She was afebrile with pulse of 59, respiratory rate  18, blood pressure 124/71.  O2 sats were 97% on room air.  Chest was  clear with no rubs.  She did exhibit a grade 1 systolic murmur; rhythm  was regular.  Homans sign was negative.  She had essentially no edema.   Despite the D-dimer, which was  0.74 initially and 0.73 upon repeat,  clinically there was no evidence pulmonary thromboemboli.  The etiology  of her pain is felt to be the hiatal hernia.   It was recommended that she take ranitidine 150 mg 30 minutes before  breakfast and the evening meal.  Prescription for 60 pills was provided.  This would cost 4 dollars if insurance was not used at [facility] or [facility].  Additionally, pravastatin 40 mg at bedtime, # 30, was recommended  because of her significant dyslipidemia, documented on review of Dr.  Debby Bud' records.  Glucosamine 1500 mg daily was  recommended as a  supplement for her joint symptoms.  Additionally, Voltaren gel to be  applied twice a day as needed to the joints was recommended.  She was to  continue her calcium and vitamin D twice a day and hydrochlorothiazide  and generic Allegra as needed.   It was recommend that she minimize or reduce exposure to any foods or  substances, which increase acid output.  At present, the most important  factor would be the nonsteroidal Advil, she takes as needed.  Arthritis  Strength Tylenol was recommended as needed.  The other triggers for  reflux were described; these include alcohol, peppermint, tobacco and  caffeine( coffee, tea, cola, and chocolate).  She was asked not to eat  within 2 hours of going to bed.   If she tolerates the pravastatin and the ranitidine, these can be  renewed by Dr. Debby Bud in 90-day increments, which will be 10 dollars at  one of the pharmacies noted above.   Discharge status is improved in that  she was pain free.  Diet would be  of choice, avoiding any foods that do increase ERD.      Titus Dubin. Alwyn Ren, MD,FACP,FCCP  Electronically Signed     WFH/MEDQ  D:  05/21/2008  T:  05/21/2008  Job:  [id number]   cc:   Rosalyn Gess. Norins, MD

## 2010-08-06 NOTE — Op Note (Signed)
NAMEALISON, [name]             ACCOUNT NO.:  [id number]   MEDICAL RECORD NO.:  [id number]          PATIENT TYPE:  AMB   LOCATION:  SDC                           FACILITY:  [facility]   PHYSICIAN:  Malachi Pro. Ambrose Mantle, M.D. DATE OF BIRTH:  December 11, 1933   DATE OF PROCEDURE:  DATE OF DISCHARGE:                               OPERATIVE REPORT   PREOPERATIVE DIAGNOSES:  Probable endometrial polyp on ultrasound,  atypical glandular cells on endometrial biopsy.   POSTOPERATIVE DIAGNOSIS:  Endometrial polyps.   OPERATION:  D and C, hysteroscopy with removal of endometrial polyps,  and cervical biopsy.   OPERATOR:  Malachi Pro. Ambrose Mantle, MD   General anesthesia.   The patient was brought to the operating room and given a MAC  anesthetic; however, the laryngeal airway did not work and so she was  intubated.  She was placed in lithotomy position in the [place] stirrups.  The vulva, vagina, and perineum were prepped with Betadine solution,  draped as a sterile field.  The uterus was slightly anterior, upper  limit of normal size.  The adnexa were free of masses.  There was a  third to fourth degree cystocele.  The cervix was drawn into the  operative field.  After the sorbitol collection, drape was placed under  the buttocks and the area was draped.  The uterus sounded to 8 cm,  slightly anteriorly.  The cervical canal was then dilated so that it  would easily admit the hysteroscope.  I inserted the hysteroscope and  was able to see the endometrial polyp which I took picture of and then  removed the hysteroscope, and with the polyp forceps and the miniature  ring forceps was able to remove a considerable amount of polypoid  tissue.  I then looked back with the hysteroscope, and there was still a  polyp present where this was additional part of the same one that I had  seen initially on the additional polyp, I could not be sure, but I saw  that it was coming from the right fundus, so I removed the  hysteroscope  and removed it.  I then looked again with the hysteroscope and all the  polypoid tissue had been removed.  I took a picture of the fundus of the  uterus which was clear.  I then did an endocervical followed by an  endometrial curettage producing minimal tissue.  There was a lesion at 4  o'clock on the cervix and I biopsied that, but on biopsy it was most  likely just a hemorrhagic Nabothian cyst.  All tissue was preserved and  sent to Pathology.  The patient seemed to tolerate the procedure well.  Blood loss was less than 10 mL.  The fluid deficit was 105 mL, but we  estimate at least 50 mL on the drapes.      Malachi Pro. Ambrose Mantle, M.D.  Electronically Signed     TFH/MEDQ  D:  11/08/2008  T:  11/08/2008  Job:  [id number]

## 2010-08-06 NOTE — Assessment & Plan Note (Signed)
[facility]                           PRIMARY CARE OFFICE NOTE   BRENLEIGH, COLLET                    MRN:          [id number]  DATE:08/11/2006                            DOB:          07-21-1933    Ms. Digman is a 77 year old [place] American woman who presents for a  follow up evaluation and exam. She was last seen in the office on  12/02/05 for an interval check up. At that time she was doing well  although her serum glucose was markedly elevated. Hemoglobin A1C was  obtained and was 6.1%. The patient was advised to follow up, no sugar,  low carb diet. The patient also had lipids at that time with an LDL of  103. In the interval, the patient has been doing relatively well. She  reports that she has been having pain and a locking trigger finger  effect in the fourth digit of her right hand.   Past medical history, family history, and social history are well  documented in my note of 11/02/04 with no significant change.   CURRENT MEDICATIONS:  1. Vytorin 10/20.  2. Zantac 75 mg p.r.n.  3. Hydrochlorothiazide 25 mg p.r.n. for fluid retention.  4. Allegra p.r.n. for allergy symptoms.   REVIEW OF SYSTEMS:  The patient has had no fevers, sweats, or chills.  She has had a three pound weight gain. The patient is blind. The patient  is having some dental work done at this time, but nothing severe. She  admits to brief palpitations with a funny feeling, although there are no  symptoms of syncope, near syncope, chest pain, or shortness of breath.  Her symptoms usually clear with a cough. The patient has had no  respiratory difficulty. GI is significant for occasional sharp pain in  the left upper quadrant, but this is not persistent. There are no GU  complaints. Musculoskeletal complaints as above. There are no neurologic  or dermatologic problems.   PHYSICAL EXAMINATION:  VITAL SIGNS:  Temperature 97.8, blood pressure  105/74, pulse 78, weight  188.  GENERAL:  This is a heavy set African American woman in no acute  distress.  HEENT:  Normocephalic atraumatic. EACs and TM were unremarkable.  Oropharynx without lesions. Ophthalmology eye exam differed.  NECK:  Supple without thyromegaly.  NODES:  No lymphadenopathy was noted in the cervical supraclavicular or  axillary regions.  CHEST:  No CVA tenderness.  LUNGS:  Clear to auscultation and percussion.  CARDIOVASCULAR:  2+ radial pulse, no JVD or carotid bruits. She had a  quiet precordium with a regular rate and rhythm without murmurs, rubs,  or gallops.  ABDOMEN:  Soft, no guarding, no rebound, no organo splenomegaly was  appreciated.  GENITALIA:  Differed.  RECTAL:  Differed.  EXTREMITIES:  Without cyanosis, clubbing, edema, or deformity.  NEUROLOGIC:  Normal, except for the patient's blindness.   DATABASE:  Her cholesterol was 262, triglycerides 164, HDL was 53.9, LDL  157.7, serum glucose was 99, kidney functio was normal with a creatinine  of 0.9, BUN 19, liver functions were normal.   ASSESSMENT AND PLAN:  1. Hyperlipidemia. The patient's has got a significant elevation in      her cholesterol. I am curious as to whether she may have stopped      taking Vytorin temporarily given that in November her LDL was 103.      If not, I would then recommend better dietary compliance and      possible increase in medication to Vytorin 10/40. This information      will be relayed to the patient.  2. Health maintenance. The patient's colonoscopy was in August 2002      and she is stable. She did have a nuclear stress study in July 2007      which was read out as a normal study with no evidence of ischemia.      Her last mammogram was January 13, 2006.   FINAL SUMMARY:  This is a very pleasant woman who does seem medically  stable with an unremarkable physical exam with mild elevation in her  lipids which needs to be addressed either by diet or increase in  medication.   The  patient is asked to return to see me on a p.r.n. basis.     Rosalyn Gess Norins, MD  Electronically Signed    MEN/MedQ  DD: 08/13/2006  DT: 08/13/2006  Job #: [id number]   cc:   Joanna Hews

## 2010-08-06 NOTE — H&P (Signed)
[name], [name]             ACCOUNT NO.:  [id number]   MEDICAL RECORD NO.:  [id number]          PATIENT TYPE:  INP   LOCATION:  3735                         FACILITY:  [facility]   PHYSICIAN:  Titus Dubin. Hopper, MD,FACP,FCCPDATE OF BIRTH:  22-Jan-1936   DATE OF ADMISSION:  05/20/2008  DATE OF DISCHARGE:                              HISTORY & PHYSICAL   [name] is a 77 year old African American female admitted with  recurrent left thoracic chest pain.   Initial episode was approximately 4:30 a.m. on May 19, 2008, upon  awakening.  The pain lasted approximately 30 minutes and was described  as sharp and severe as 10/10.  It did radiate superiorly to the left  shoulder.  She has had no substernal or left chest pain.  The pain has  been in the left inferior thoracic area in the mid-axillary line.   The pain recurred approximately 5:30 this morning, again lasted  approximately 35 min.  She took a Customer service manager Aspirin.   She has had a past history of hiatal hernia.  She states that she has  had intermittent gnawing left upper quadrant pain intermittently over  the past 2 years.  She takes Zantac as needed with benefit.   PAST MEDICAL HISTORY:  1. Dyslipidemia.  2. GERD.  3. Hypertension.  4. EXTRINSIC allergies.   ALLERGIES:  She states she is intolerant to SULFA.   SOCIAL HISTORY:  She does not drink or smoke.   The records from Dr. Debby Bud' office was reviewed.  She had a bone  density in October 2009, which revealed osteopenia.  Specifically total  hip T score was -2.2.   An endoscopy was performed in July 2009.  This revealed a hiatal hernia,  chronic esophageal reflux, and gastritis.  Additionally, a colonoscopy  was performed at that time by Dr. Eloise Harman.  Diverticulosis and  hemorrhoids were noted as well with colon polyps.  The polyp proved to  be a tubular adenoma.  Stomach biopsy revealed Helicobacter pylori  gastritis with focal intestinal metaplasia.  She also  was initially  prescribed Prevpac, but this was declined by the patient due to cost.  Medications were then changed to amoxicillin, metronidazole, bismuth,  and cimetidine in August to treat the Helicobacter pylori.   PAST SURGICAL HISTORY:  1. Right shoulder surgery in 2005.  2. She is gravida 5 and para 5.  She had tubal ligation.  3. She also had a ovarian cystectomy and appendectomy.  4. Remotely, she had tonsillectomy.   She states that she takes no medicines regularly.  She has been taking  calcium intermittently.  The simvastatin prescribed for dyslipidemia was  discontinued in November due to thigh pain.  She will take Zantac 75 as  needed.  She also takes hydrochlorothiazide as needed and Allegra 180 as  needed.  The chart also listed Nexium 40 as needed.   FAMILY HISTORY:  Myocardial infarction in her sister in her 73s.  There  is no history of stroke or duodenal ulcer disease.   REVIEW OF SYSTEMS:  Intermittent head congestion.  It is noted that she  has  FENOFEXADINE for allergies.   She cannot answer any questions about secretion appearance or body  fluids because of blindness from childhood.  She states that  the  blindness was a complication of severe pneumonia.   She has not had any significant congestion at this time.   She denies any otic pain or decreased hearing.   She has no hoarseness or trouble swallowing.   She is not having significant headaches.   She will have cough  due to postnasal drainage.   She has had no anginal type symptoms.  She denies any palpitations.   She is unable to visualize the stool, but believes there has been no  change.  She does take Benefiber for constipation.   She denies any dysuria or odiferous  urine.   She  admits to being depressed at times, but is not sustained or  significant.   She has had no new skin lesions.   She feels, there is no change in her hair or skin subjectively.   She denies excessive thirst,  hunger, or urination.  She has no cold or  heat intolerance.   She does have radicular pain down the left leg posteriorly  intermittently after flexion @ the waist   She has known hemorrhoids.   She will take Advil up to 2 pills a day 2-3 times a week as needed for  arthralgias, particularly in the right knee.   She does describe gas production.   PHYSICAL EXAMINATION:  GENERAL:  She is in no acute distress.  VITAL SIGNS:  Her blood pressure 145/90, initially; follow up was  116/68.  HEENT:  She wears sunglasses & keeps her eyes closed.  A more detailed  evaluation of the eyes was not done because of the history provided.  She does have some increased wax in the otic canals.  Nares are patent.  Dental hygiene is good.  Oropharynx is clear.  NECK:  She has no thyromegaly or nodules.  There is no lymphadenopathy  about the head and/or neck.  CHEST:  Clear without splinting or rubs.  HEART:  She has a grade 1 systolic murmur.  ABDOMEN:  Nontender; there is no organomegaly or masses noted.  EXTREMITIES:  Fusiform enlargement of the knee is present with some pain  with range of motion in the right knee.  She has trace edema.  Pedal  pulses are present.  GENITOURINARY:  Not performed as they are not germain to this  admission.   Serial EKGs were reviewed.  These revealed low voltage, poor and  nonspecific ST-T wave changes in lead III, aVF and V1-V5.   Cardiac enzymes are initially negative.  Her D-dimer was elevated at  0.74 with normals less than 0.48.  Chest x-ray revealed cardiomegaly.  Urinalysis was negative except for small amount of leukocyte esterase.  She had 0-2 white cells and rare bacteria.  Chemistries were normal.  Hematocrit was decreased at 33.9.   CT angiogram was negative for PE.   The patient will be admitted to telemetry and serial enzymes collected.   The history and the D-dimer elevation would suggest PE except for the  negative CT angio.  The most likely  etiology of the pain  is a hiatal  hernia.   Hemoccults will be performed and serial CBCs diff monitored.      Titus Dubin. Alwyn Ren, MD,FACP,FCCP  Electronically Signed     WFH/MEDQ  D:  05/20/2008  T:  05/20/2008  Job:  [id number]  cc:   Rosalyn Gess. Norins, MD

## 2010-08-09 NOTE — Op Note (Signed)
[facility]  Patient:    [name], [name] Visit Number: [id number] MRN: [id number]          Service Type: SUR Location: 5000 [id number] Attending Physician:  Jacki Cones Dictated by:   Veverly Fells Ophelia Charter, M.D. Proc. Date: 06/21/01 Admit Date:  06/21/2001                             Operative Report  PREOPERATIVE DIAGNOSIS:  Right shoulder osteoarthritis with rotator cuff tear.  POSTOPERATIVE DIAGNOSIS:  Right shoulder osteoarthritis with rotator cuff tendinopathy and partial tearing of the subscapularis.  OPERATION PERFORMED:  Right shoulder glenohumeral joint arthroplasty (total shoulder) repair of subscapularis tendon.  SURGEON:  Mark C. Ophelia Charter, M.D.  ASSISTANT:  Zonia Kief, PA-C  ANESTHESIA:  General orotracheal.  ESTIMATED BLOOD LOSS:  300 ml.  COMPONENTS USED:  Osteonics #7 glenoid cemented #9 humeral prosthesis with 13 mm distal stem.  DESCRIPTION OF PROCEDURE:  After induction of general anesthesia and orotracheal intubation, the patient was placed in a beach chair position with careful padding and positioning.  Preoperative Ancef was given and the patient was prepped and draped with impervious stockinet and Cobans, sterile skin marker and Betadine Vi-drapes to seal the skin.  Deltopectoral approach was made taking the cephalic vein out lateral with the deltoid and small A transverse veins to the pectoralis were coagulated and divided.  Conjoined tendon was identified, tissue split lateral to the conjoined tendon.  A self-retaining retractor was placed underneath the conjoined tendon and underneath the deltoid.  Subscapularis showed some partial tearing and the tendon was partially released with a small cuff adjacent to the bone for later repair and #5 Mersilene tape with Tajima type suture was placed through the tendon with several different sutures for later repair and then the subscap tendon was divided down to the capsule.   The capsule was thickly adherent. The capsule was opened with subscap exposing the joint.  There were large osteophytes and two small little loose pieces of bone removed from inside the joint which were loose bodies.  The shoulder was extended farther in the operative field.  Using the template, the oscillating saw was used to remove the articular cartilage with 20 to 30 degrees of retroversion.  The shoulder was then rotated in and checked using the elbow flexed at 90 with internal and external rotation to make sure that the version was appropriate.  Sequential hand reaming followed by broaching was performed.  The canal was very large and required a 13 mm distal stem to get good canal bite.  Proximal cancellous bone was good and it was elected to proceed with noncemented humeral component.  The glenoid was exposed.  The labrum was excised except superiorly where the biceps tendon was present.  Reamer used to remove the articular cartilage.  Holes were used for pegs, superior and inferior.  Superior peg was just at the very edge of the glenoid and there was some partial cut out in the hole between the 10 and 2 oclock position.  Inferior angled peg was completely within bone.  Position of the glenoid within the socket was within 1 or 2 mm of ideal.  It was elected to proceed with this rather than repeat drill the holes.  Cement was vacuum mixed, pulled into a ball and finger impacted.  Care was taken to make sure no extra cement extruded up through the top hole into  the supraspinatus fossa.  Glenoid #7 was impacted down and held until the cement was hardened at 15 minutes.  All excessive cement was removed.  Glenoid was tested and was extremely stable.  Press-fit humeral component was then impacted.  Stability was checked and confirmed.  After irrigation multiple times, the humeral component was impacted into place. Repeat irrigation.  Deltopectoral incision was then closed repairing  the subscapularis down to its tendinous attachment.  The supraspinatus portion of the cuff was visualized and was intact.  It did not require repair. Preoperative MRI suggested complete tear but it was not present.  Rotator cuff interval was repaired.  2-0 Vicryl was placed in the subcutaneous tissue. Hemovac drain was placed prior to closure and skin staple closure, Marcaine infiltration of the skin.  Postoperative shoulder immobilizer.  The patient was transferred to the recovery room in stable condition.  Instrument and needle counts were correct. Dictated by:   Veverly Fells Ophelia Charter, M.D. Attending Physician:  Jacki Cones DD:  06/22/01 TD:  06/22/01 Job: [id number]

## 2010-12-18 DIAGNOSIS — E559 Vitamin D deficiency, unspecified: Secondary | ICD-10-CM

## 2010-12-24 ENCOUNTER — Ambulatory Visit (INDEPENDENT_AMBULATORY_CARE_PROVIDER_SITE_OTHER): Payer: PRIVATE HEALTH INSURANCE | Admitting: Endocrinology

## 2010-12-24 ENCOUNTER — Other Ambulatory Visit: Payer: PRIVATE HEALTH INSURANCE

## 2010-12-24 ENCOUNTER — Encounter: Payer: Self-pay | Admitting: Endocrinology

## 2010-12-24 DIAGNOSIS — M899 Disorder of bone, unspecified: Secondary | ICD-10-CM

## 2010-12-24 DIAGNOSIS — E559 Vitamin D deficiency, unspecified: Secondary | ICD-10-CM

## 2010-12-24 DIAGNOSIS — M949 Disorder of cartilage, unspecified: Secondary | ICD-10-CM

## 2010-12-24 NOTE — Patient Instructions (Addendum)
blood tests are being requested for you today.  You will receive a phone call, with your test results.  Please return in 1 year. Please sign a release of information for your last done-density test. Please schedule a regular physical with dr Debby Bud soon.

## 2010-12-24 NOTE — Progress Notes (Signed)
Subjective:    Patient ID: [name], female    DOB: April 17, 1933, 77 y.o.   MRN: [id number]  HPI Pt was found in 2010 to have vitamin-d deficiency.  this has persisted despite high-dose supplementation.  She denies muscle cramps, but she has myalgias.  She does not take  Vitamin-d supplement, but she says her diet contains dairy products.  Denies falls.  She denies any h/o bony fracture.  She says dr Ambrose Mantle does her dexa, but does not recall the last date.   Past Medical History  Diagnosis Date  . VITAMIN D DEFICIENCY 01/25/2009  . HYPERLIPIDEMIA 12/14/2006  . BLINDNESS, LEGAL, [place] DEFINITION 12/14/2006  . EXTERNAL HEMORRHOIDS 09/30/2007  . GERD 10/01/2007  . CONSTIPATION 12/14/2006  . RECTAL BLEEDING 10/01/2007  . OSTEOPENIA 02/07/2008  . OOPHORECTOMY, RIGHT, HX OF 12/14/2006  . ROTATOR CUFF REPAIR, RIGHT, HX OF 12/14/2006    Past Surgical History  Procedure Date  . Rotator cuff repair     right  . Right oophorectomy   . Irrigation and debridement sebaceous cyst 06/07    [name]  . Tubal ligation   . Le art doppler  '98    normal  . Nuc stress test  07/07    normal study    History   Social History  . Marital Status: Married    Spouse Name: N/A    Number of Children: 5  . Years of Education: 12   Occupational History  . Not on file.   Social History Main Topics  . Smoking status: Never Smoker   . Smokeless tobacco: Not on file  . Alcohol Use: No  . Drug Use:   . Sexually Active:    Other Topics Concern  . Not on file   Social History Narrative   HSGMarried '613 sons- '62, '64, '65; 2 daughters- '60, '66: 5 grandchildrenLives with husbandSupport family-close at hand    Current Outpatient Prescriptions on File Prior to Visit  Medication Sig Dispense Refill  . esomeprazole (NEXIUM) 40 MG capsule Take 40 mg by mouth as needed.        . fexofenadine (ALLEGRA) 180 MG tablet Take 180 mg by mouth as needed.        . hydrochlorothiazide (HYDRODIURIL) 25 MG tablet  Take 25 mg by mouth daily.        . pravastatin (PRAVACHOL) 40 MG tablet TAKE 1 TABLET BY MOUTH AT BEDTIME  30 tablet  5  . ranitidine (ZANTAC) 150 MG tablet Take 150 mg by mouth daily as needed.       Marland Kitchen ibuprofen (ADVIL) 200 MG tablet Take 200 mg by mouth as needed.          No Known Allergies  Family History  Problem Relation Age of Onset  . Hypertension Father   . Dementia Father   . Cancer Neg Hx     Negative for breast or colon cancer  . Heart disease Neg Hx     BP 122/78  Pulse 98  Temp(Src) 98.9 F (37.2 C) (Oral)  Ht 5\' 3"  (1.6 m)  Wt 195 lb (88.451 kg)  BMI 34.54 kg/m2  SpO2 98%    Review of Systems Denies diarrhea.      Objective:   Physical Exam VITAL SIGNS:  See vs page GENERAL: no distress Neck - No masses or thyromegaly or limitation in range of motion MUSCULOSKELETAL: muscle bulk and strength are grossly normal.  no obvious joint swelling.  No deformity.  Assessment & Plan:  Vit-d deficiency: Although she has not had any fx yet, she is at risk for such, in view of her blindness.

## 2011-01-03 ENCOUNTER — Encounter: Payer: PRIVATE HEALTH INSURANCE | Admitting: Internal Medicine

## 2011-01-31 ENCOUNTER — Other Ambulatory Visit: Payer: Self-pay | Admitting: Internal Medicine

## 2011-02-28 ENCOUNTER — Encounter: Payer: PRIVATE HEALTH INSURANCE | Admitting: Internal Medicine

## 2011-04-10 ENCOUNTER — Other Ambulatory Visit (INDEPENDENT_AMBULATORY_CARE_PROVIDER_SITE_OTHER): Payer: PRIVATE HEALTH INSURANCE

## 2011-04-10 ENCOUNTER — Ambulatory Visit (INDEPENDENT_AMBULATORY_CARE_PROVIDER_SITE_OTHER): Payer: PRIVATE HEALTH INSURANCE | Admitting: Internal Medicine

## 2011-04-10 VITALS — BP 132/86 | HR 87 | Temp 98.0°F | Wt 191.0 lb

## 2011-04-10 DIAGNOSIS — Z1231 Encounter for screening mammogram for malignant neoplasm of breast: Secondary | ICD-10-CM

## 2011-04-10 DIAGNOSIS — K219 Gastro-esophageal reflux disease without esophagitis: Secondary | ICD-10-CM

## 2011-04-10 DIAGNOSIS — Z1239 Encounter for other screening for malignant neoplasm of breast: Secondary | ICD-10-CM

## 2011-04-10 DIAGNOSIS — E559 Vitamin D deficiency, unspecified: Secondary | ICD-10-CM

## 2011-04-10 DIAGNOSIS — Z136 Encounter for screening for cardiovascular disorders: Secondary | ICD-10-CM

## 2011-04-10 DIAGNOSIS — K5900 Constipation, unspecified: Secondary | ICD-10-CM

## 2011-04-10 DIAGNOSIS — E785 Hyperlipidemia, unspecified: Secondary | ICD-10-CM

## 2011-04-10 DIAGNOSIS — Z0000 Encounter for general adult medical examination without abnormal findings: Secondary | ICD-10-CM

## 2011-04-10 LAB — LIPID PANEL
HDL: 59.2 mg/dL (ref >39.00)
Total CHOL/HDL Ratio: 3

## 2011-04-10 LAB — CBC WITH DIFFERENTIAL/PLATELET
Basophils Absolute: 0 10*3/uL (ref 0.0–0.1)
Basophils Relative: 0.5 % (ref 0.0–3.0)
Eosinophils Absolute: 0.1 10*3/uL (ref 0.0–0.7)
Hemoglobin: 11.9 g/dL — ABNORMAL LOW (ref 12.0–15.0)
Lymphocytes Relative: 37.9 % (ref 12.0–46.0)
MCHC: 33.6 g/dL (ref 30.0–36.0)
MCV: 85.9 fl (ref 78.0–100.0)
Monocytes Absolute: 0.3 10*3/uL (ref 0.1–1.0)
Neutro Abs: 2.9 10*3/uL (ref 1.4–7.7)
RBC: 4.12 Mil/uL (ref 3.87–5.11)
RDW: 14.8 % — ABNORMAL HIGH (ref 11.5–14.6)

## 2011-04-10 LAB — COMPREHENSIVE METABOLIC PANEL
ALT: 16 U/L (ref 0–35)
Albumin: 4.2 g/dL (ref 3.5–5.2)
CO2: 25 mEq/L (ref 19–32)
Calcium: 9.6 mg/dL (ref 8.4–10.5)
Chloride: 104 mEq/L (ref 96–112)
GFR: 84.85 mL/min (ref >60.00)
Glucose, Bld: 93 mg/dL (ref 70–99)
Potassium: 4 mEq/L (ref 3.5–5.1)
Sodium: 139 mEq/L (ref 135–145)
Total Protein: 7.8 g/dL (ref 6.0–8.3)

## 2011-04-10 LAB — HEPATIC FUNCTION PANEL
AST: 22 U/L (ref 0–37)
Albumin: 4.2 g/dL (ref 3.5–5.2)
Total Bilirubin: 0.4 mg/dL (ref 0.3–1.2)

## 2011-04-10 MED ORDER — ERGOCALCIFEROL 1.25 MG (50000 UT) PO CAPS: 50000.0000 [IU] | ORAL_CAPSULE | ORAL | Status: AC

## 2011-04-10 NOTE — Progress Notes (Signed)
Subjective:    Patient ID: [name], female    DOB: 30-Nov-1933, 77 y.o.   MRN: [id number]  HPI The patient is here for annual Medicare wellness examination and management of other chronic and acute problems.   The risk factors are reflected in the social history.  The roster of all physicians providing medical care to patient - is listed in the Snapshot section of the chart.  Activities of daily living:  The patient is 100% inedpendent in all ADLs: dressing, toileting, feeding as well as independent mobility  Home safety : The patient has smoke detectors in the home. Falls - house is fall-safe, grab bars in shower. They wear seatbelts. No firearms at home  There is no violence in the home.   There is no risks for hepatitis, STDs or HIV. There is no history of blood transfusion. They have no travel history to infectious disease endemic areas of the world.  The patient has seen their dentist in the last six month. They have not seen their eye doctor in the last year - blind. They deny any hearing difficulty and have not had audiologic testing in the last year.  They do not  have excessive sun exposure. Discussed the need for sun protection: hats, long sleeves and use of sunscreen if there is significant sun exposure.   Diet: the importance of a healthy diet is discussed. They do have a healthy diet.  The patient has no regular exercise program.  The benefits of regular aerobic exercise were discussed.  Depression screen: there are no signs or vegative symptoms of depression- irritability, change in appetite, anhedonia, sadness/tearfullness.  Cognitive assessment: the patient manages all their financial and personal affairs and is actively engaged. They could relate day,date,year and events; recalled 3/3 objects at 3 minutes; performed clock-face test normally.  The following portions of the patient's history were reviewed and updated as appropriate: allergies, current medications,  past family history, past medical history,  past surgical history, past social history  and problem list.  Vision, hearing, body mass index were assessed and reviewed.   During the course of the visit the patient was educated and counseled about appropriate screening and preventive services including : fall prevention , diabetes screening, nutrition counseling, colorectal cancer screening, and recommended immunizations.  Past Medical History  Diagnosis Date  . VITAMIN D DEFICIENCY 01/25/2009  . HYPERLIPIDEMIA 12/14/2006  . BLINDNESS, LEGAL, [place] DEFINITION 12/14/2006  . EXTERNAL HEMORRHOIDS 09/30/2007  . GERD 10/01/2007  . CONSTIPATION 12/14/2006  . RECTAL BLEEDING 10/01/2007  . OSTEOPENIA 02/07/2008  . OOPHORECTOMY, RIGHT, HX OF 12/14/2006  . ROTATOR CUFF REPAIR, RIGHT, HX OF 12/14/2006   Past Surgical History  Procedure Date  . Rotator cuff repair     right  . Right oophorectomy   . Irrigation and debridement sebaceous cyst 06/07    [name]  . Tubal ligation   . Le art doppler  '98    normal  . Nuc stress test  07/07    normal study   Family History  Problem Relation Age of Onset  . Hypertension Father   . Dementia Father   . Cancer Neg Hx     Negative for breast or colon cancer  . Heart disease Neg Hx    History   Social History  . Marital Status: Married    Spouse Name: N/A    Number of Children: 5  . Years of Education: 12   Occupational History  . Not on file.  Social History Main Topics  . Smoking status: Never Smoker   . Smokeless tobacco: Not on file  . Alcohol Use: No  . Drug Use:   . Sexually Active:    Other Topics Concern  . Not on file   Social History Narrative   HSGMarried '613 sons- '62, '64, '65; 2 daughters- '60, '66: 5 grandchildrenLives with husbandSupport family-close at hand      Review of Systems Constitutional:  Negative for fever, chills, activity change and unexpected weight change.  HEENT:  Negative for hearing loss, ear pain,  congestion, neck stiffness and postnasal drip. Negative for sore throat or swallowing problems. Negative for dental complaints.   Eyes: Negative for vision loss or change in visual acuity.  Respiratory: Negative for chest tightness and wheezing. Negative for DOE.   Cardiovascular: Negative for chest pain or palpitations. No decreased exercise tolerance Gastrointestinal: No change in bowel habit but does have occasional constipation. No bloating. Has gas. No reflux or indigestion Genitourinary: Negative for urgency, frequency, flank pain and difficulty urinating.  Musculoskeletal: Negative for back pain and gait problem. Has some back pain and large joint pain responsive to NSAIDs. Neurological: Negative for dizziness, tremors, weakness and headaches.  Hematological: Negative for adenopathy.  Psychiatric/Behavioral: Negative for behavioral problems and dysphoric mood.       Objective:   Physical Exam Filed Vitals:   04/10/11 0854  BP: 132/86  Pulse: 87  Temp: 98 F (36.7 C)   Gen'l: well nourished, well developed, overweight AA woman in no distress HEENT - [place]/AT, EACs/TMs normal, oropharynx with native dentition in fair condition, no buccal or palatal lesions, posterior pharynx clear, mucous membranes moist. C&S clear, PERRLA, fundi - normal Neck - supple, no thyromegaly Nodes- negative submental, cervical, supraclavicular regions Chest - no deformity, no CVAT Lungs - cleat without rales, wheezes. No increased work of breathing Breast - nipples w/o discharge, skin is normal - dark mole right breast at 0700, no fix mass or lesion but there are small fibrocystic nodules, no axillary adenopathy Cardiovascular - regular rate and rhythm, quiet precordium, no murmurs, rubs or gallops, 2+ radial, DP and PT pulses Abdomen - BS+ x 4, no HSM, no guarding or rebound or tenderness Pelvic - deferred  Rectal - deferred  Extremities - no clubbing, cyanosis, edema or deformity.  Neuro - A&O x 3, CN  II-XII normal, motor strength normal and equal, DTRs 2+ and symmetrical biceps, radial, and patellar tendons. Cerebellar - no tremor, no rigidity, fluid movement and normal gait. Derm - Head, neck, back, abdomen and extremities without suspicious lesions  Lab Results  Component Value Date   WBC 5.4 04/10/2011   HGB 11.9* 04/10/2011   HCT 35.4* 04/10/2011   PLT 234.0 04/10/2011   GLUCOSE 93 04/10/2011   CHOL 199 04/10/2011   TRIG 100.0 04/10/2011   HDL 59.20 04/10/2011   LDLDIRECT 133.2 09/04/2009   LDLCALC 120* 04/10/2011   ALT 16 04/10/2011   ALT 16 04/10/2011   AST 22 04/10/2011   AST 22 04/10/2011   NA 139 04/10/2011   K 4.0 04/10/2011   CL 104 04/10/2011   CREATININE 0.8 04/10/2011   BUN 18 04/10/2011   CO2 25 04/10/2011   TSH 0.66 09/04/2009           Assessment & Plan:

## 2011-04-10 NOTE — Patient Instructions (Signed)
Vitamin D deficiency - Vit d = 24 in October. Plan - Vit D2 50,000 units once a week for 8 weeks then resume 1000 iu daily.  Check with your insurance carrier for coverage of shingles vaccine (zostavax)  Weight management: smart food choices, PORTION SIZE - watch the amounts, regular exercise . Want to loose 1 lb per month for 3 years.   Exam is normal today. Labs are ordered. You will receive a report  Thanks for coming to see me.

## 2011-04-12 DIAGNOSIS — Z0000 Encounter for general adult medical examination without abnormal findings: Secondary | ICD-10-CM | POA: Insufficient documentation

## 2011-04-12 NOTE — Assessment & Plan Note (Signed)
Sy.mptoms are well controlled using PPI in the AM and H2 blocker in the PM.  Plan - continue present regimen

## 2011-04-12 NOTE — Assessment & Plan Note (Signed)
Interval medical history is unremarkable. Physical exam is notable for long standing vision loss and weight issues. Lab results are in normal limits. She is current for colorectal cancer screening with last study July '09. She is due for mammography and will be schedule. Immunizations: pneumonia vaccine Nov '10; due for tetanus and shingles vaccine. 12 lead EKG appears normal ( disagree with machine read).  Weight management: weight is a key health issue for her and we discussed how this impacts many other aspects of her health and wellness. Plan - smart food choices; PORTION SIZE CONTROL; regular exercise - 3 times a week for 30 minutes with a target heart rate of 120+.            Target weight 175 lbs; Goal - to loose 1 lb per month.  In summary - a very nice woman who appears to be medically stable at this time. She will return in 6 months for follow-up and weigh in.

## 2011-04-12 NOTE — Assessment & Plan Note (Signed)
Last Vit D level = 24. By her report she was never treated with high dose D2  Plan - ergochocalciferol 50,000 weekly x 8 weeks, then resume maintenance Vit D 1000 iu daily

## 2011-04-12 NOTE — Assessment & Plan Note (Signed)
stable with no active complaint at today's visit.  Continue present bowel regimen

## 2011-04-12 NOTE — Assessment & Plan Note (Signed)
For low to moderate risk patient - no known CAD, moderate risk factors - the LDL is better than goal (NCEP-ATPIII) of 130 or less.  Plan - continue present dosing of Pravastatin.

## 2011-04-13 ENCOUNTER — Encounter: Payer: Self-pay | Admitting: Internal Medicine

## 2011-04-17 NOTE — Progress Notes (Signed)
Addended by: Rock Nephew T on: 04/17/2011 12:08 PM   Modules accepted: Orders

## 2011-04-22 ENCOUNTER — Telehealth: Payer: Self-pay | Admitting: *Deleted

## 2011-04-22 NOTE — Telephone Encounter (Signed)
Patient called and left voice message requesting a return cal regarding  a mammogram.  Call was returned to patient, She states she had a mammogram done in November of 2012. [facility] and was not sure if she needed to have it done since she had it done already.  She was informed per Dr Debby Bud that she did not need to repeat mammogram.

## 2011-05-29 ENCOUNTER — Other Ambulatory Visit: Payer: Self-pay | Admitting: Internal Medicine

## 2011-09-25 ENCOUNTER — Other Ambulatory Visit: Payer: Self-pay | Admitting: Internal Medicine

## 2012-04-05 ENCOUNTER — Encounter: Payer: Self-pay | Admitting: Internal Medicine

## 2012-04-06 ENCOUNTER — Encounter: Payer: Self-pay | Admitting: Internal Medicine

## 2012-04-27 ENCOUNTER — Encounter: Payer: PRIVATE HEALTH INSURANCE | Admitting: Internal Medicine

## 2012-06-08 ENCOUNTER — Encounter: Payer: PRIVATE HEALTH INSURANCE | Admitting: Internal Medicine

## 2012-06-19 ENCOUNTER — Other Ambulatory Visit: Payer: Self-pay | Admitting: Internal Medicine

## 2012-06-30 ENCOUNTER — Other Ambulatory Visit (INDEPENDENT_AMBULATORY_CARE_PROVIDER_SITE_OTHER): Payer: PRIVATE HEALTH INSURANCE

## 2012-06-30 ENCOUNTER — Encounter: Payer: Self-pay | Admitting: Internal Medicine

## 2012-06-30 ENCOUNTER — Ambulatory Visit (INDEPENDENT_AMBULATORY_CARE_PROVIDER_SITE_OTHER): Payer: PRIVATE HEALTH INSURANCE | Admitting: Internal Medicine

## 2012-06-30 VITALS — BP 118/90 | HR 70 | Temp 98.0°F | Resp 12 | Wt 187.4 lb

## 2012-06-30 DIAGNOSIS — E785 Hyperlipidemia, unspecified: Secondary | ICD-10-CM

## 2012-06-30 DIAGNOSIS — R7989 Other specified abnormal findings of blood chemistry: Secondary | ICD-10-CM

## 2012-06-30 DIAGNOSIS — Z0000 Encounter for general adult medical examination without abnormal findings: Secondary | ICD-10-CM

## 2012-06-30 DIAGNOSIS — K219 Gastro-esophageal reflux disease without esophagitis: Secondary | ICD-10-CM

## 2012-06-30 DIAGNOSIS — Z23 Encounter for immunization: Secondary | ICD-10-CM

## 2012-06-30 LAB — CBC WITH DIFFERENTIAL/PLATELET
Basophils Absolute: 0 10*3/uL (ref 0.0–0.1)
Eosinophils Relative: 2.1 % (ref 0.0–5.0)
HCT: 35.8 % — ABNORMAL LOW (ref 36.0–46.0)
Hemoglobin: 11.6 g/dL — ABNORMAL LOW (ref 12.0–15.0)
Lymphocytes Relative: 39.2 % (ref 12.0–46.0)
Lymphs Abs: 2 10*3/uL (ref 0.7–4.0)
Monocytes Relative: 7.5 % (ref 3.0–12.0)
Neutro Abs: 2.6 10*3/uL (ref 1.4–7.7)
RBC: 4.23 Mil/uL (ref 3.87–5.11)
RDW: 14.2 % (ref 11.5–14.6)
WBC: 5 10*3/uL (ref 4.5–10.5)

## 2012-06-30 LAB — COMPREHENSIVE METABOLIC PANEL
ALT: 17 U/L (ref 0–35)
CO2: 27 mEq/L (ref 19–32)
Calcium: 9.7 mg/dL (ref 8.4–10.5)
Chloride: 103 mEq/L (ref 96–112)
Creatinine, Ser: 0.9 mg/dL (ref 0.4–1.2)
GFR: 77.12 mL/min (ref >60.00)
Glucose, Bld: 98 mg/dL (ref 70–99)
Total Protein: 7.9 g/dL (ref 6.0–8.3)

## 2012-06-30 LAB — LIPID PANEL
LDL Cholesterol: 117 mg/dL — ABNORMAL HIGH (ref 0–99)
Total CHOL/HDL Ratio: 3
Triglycerides: 112 mg/dL (ref 0.0–149.0)

## 2012-06-30 LAB — HEPATIC FUNCTION PANEL
Albumin: 4.1 g/dL (ref 3.5–5.2)
Total Bilirubin: 0.5 mg/dL (ref 0.3–1.2)

## 2012-06-30 MED ORDER — TETANUS-DIPHTHERIA TOXOIDS TD 5-2 LFU IM INJ: 0.5000 mL | INJECTION | Freq: Once | INTRAMUSCULAR | Status: DC

## 2012-06-30 MED ORDER — FEXOFENADINE HCL 180 MG PO TABS: 180.0000 mg | ORAL_TABLET | ORAL | Status: DC | PRN

## 2012-06-30 NOTE — Progress Notes (Signed)
Subjective:    Patient ID: [name], female    DOB: Oct 31, 1933, 79 y.o.   MRN: [id number]  HPI The patient is here for annual Medicare wellness examination and management of other chronic and acute problems. No major medical illness, injury or surgery   The risk factors are reflected in the social history.  The roster of all physicians providing medical care to patient - is listed in the Snapshot section of the chart.  Activities of daily living:  The patient is 100% inedpendent in all ADLs: dressing, toileting, feeding as well as independent mobility  Home safety : The patient has smoke detectors in the home. They wear seatbelts. No firearms at home   There is no risks for hepatitis, STDs or HIV. There is no   history of blood transfusion. They have no travel history to infectious disease endemic areas of the world.  The patient has seen their dentist in the last six month. They have not seen their eye doctor in the last year - patient is blind. They deny any hearing difficulty and have not had audiologic testing in the last year.    They do not  have excessive sun exposure. Discussed the need for sun protection: hats, long sleeves and use of sunscreen if there is significant sun exposure.   Diet: the importance of a healthy diet is discussed. They do have a healthy  diet.  The patient has a regular exercise program.  The benefits of regular aerobic exercise were discussed.  Depression screen: there are no signs or vegative symptoms of depression- irritability, change in appetite, anhedonia, sadness/tearfullness.  Cognitive assessment: the patient manages all their financial and personal affairs and is actively engaged.   The following portions of the patient's history were reviewed and updated as appropriate: allergies, current medications, past family history, past medical history,  past surgical history, past social history  and problem list.  Past Medical History   Diagnosis Date  . VITAMIN D DEFICIENCY 01/25/2009  . HYPERLIPIDEMIA 12/14/2006  . BLINDNESS, LEGAL, [place] DEFINITION 12/14/2006  . EXTERNAL HEMORRHOIDS 09/30/2007  . GERD 10/01/2007  . CONSTIPATION 12/14/2006  . RECTAL BLEEDING 10/01/2007  . OSTEOPENIA 02/07/2008  . OOPHORECTOMY, RIGHT, HX OF 12/14/2006  . ROTATOR CUFF REPAIR, RIGHT, HX OF 12/14/2006   Past Surgical History  Procedure Laterality Date  . Rotator cuff repair      right  . Right oophorectomy    . Irrigation and debridement sebaceous cyst  06/07    [name]  . Tubal ligation    . Le art doppler   '98    normal  . Nuc stress test   07/07    normal study   Family History  Problem Relation Age of Onset  . Hypertension Father   . Dementia Father   . Cancer Neg Hx     Negative for breast or colon cancer  . Heart disease Neg Hx    History   Social History  . Marital Status: Married    Spouse Name: N/A    Number of Children: 5  . Years of Education: 12   Occupational History  . Not on file.   Social History Main Topics  . Smoking status: Never Smoker   . Smokeless tobacco: Not on file  . Alcohol Use: No  . Drug Use: No  . Sexually Active: Not on file   Other Topics Concern  . Not on file   Social History Narrative   HSG  Married '61   3 sons- '62, '64, '65; 2 daughters- '60, '66: 5 grandchildren   Lives with husband   Support family-close at hand          Current Outpatient Prescriptions on File Prior to Visit  Medication Sig Dispense Refill  . fexofenadine (ALLEGRA) 180 MG tablet Take 180 mg by mouth as needed.        . hydrochlorothiazide (HYDRODIURIL) 25 MG tablet TAKE 1 TABLET BY MOUTH AS NEEDED  30 tablet  2  . ibuprofen (ADVIL) 200 MG tablet Take 200 mg by mouth as needed.        . pravastatin (PRAVACHOL) 40 MG tablet TAKE 1 TABLET BY MOUTH AT BEDTIME  30 tablet  5  . ranitidine (ZANTAC) 150 MG tablet Take 150 mg by mouth daily as needed.        No current facility-administered medications on  file prior to visit.    Vision, hearing, body mass index were assessed and reviewed.   During the course of the visit the patient was educated and counseled about appropriate screening and preventive services including : fall prevention , diabetes screening, nutrition counseling, colorectal cancer screening, and recommended immunizations.    Review of Systems System review is negative for any constitutional, cardiac, pulmonary, GI or neuro symptoms or complaints other than as described in the HPI.     Objective:   Physical Exam Filed Vitals:   06/30/12 0927  BP: 118/90  Pulse: 70  Temp: 98 F (36.7 C)  Resp: 12   Wt Readings from Last 3 Encounters:  06/30/12 187 lb 6.4 oz (85.004 kg)  04/10/11 191 lb (86.637 kg)  12/24/10 195 lb (88.451 kg)   Gen'l- overweight AA woman HEENT - EACs/TMs normal Cor - 2_+ radial/DP pulse, RRR, no JVD, no Carotid bruits PUlm - normal respirations Neuro - nonfocal exam. Patient is blind      Assessment & Plan:

## 2012-06-30 NOTE — Patient Instructions (Addendum)
Thanks for coming in.  For foot care: Dr. Coral Spikes [phone], [address]  Your limited exam is ok  Be sure to walk 20-30 minutes 3 times a week.  Have your daughter help with a MyChart Account.   Come back as needed or in 1 year.

## 2012-07-01 NOTE — Assessment & Plan Note (Signed)
LDL is better than goal of 130 or less and HDL is better than goal of 40+. Liver functions are normal.  Plan Continue present medications

## 2012-07-01 NOTE — Assessment & Plan Note (Signed)
Interval history is negative for any major issues. Limited physical exam is notable for overweight status but otherwise OK. She is current with her gynecologist, current with colorectal and breast cancer screening. Immunizations - current except for Shingles vaccine and she will check with insurance as to coverage for Zostavax.   In summary A very nice woman who appears to be medically stable. She is encouraged to remain active, to walk for 20-30 minutes 3 days a week. She will return as needed or in 1 year.

## 2012-07-01 NOTE — Progress Notes (Signed)
  Subjective:    Patient ID: [name], female    DOB: 06-26-1933, 79 y.o.   MRN: [id number]  HPI    Review of Systems     Objective:   Physical Exam Lab Results  Component Value Date   WBC 5.0 06/30/2012   HGB 11.6* 06/30/2012   HCT 35.8* 06/30/2012   PLT 223.0 06/30/2012   GLUCOSE 98 06/30/2012   CHOL 198 06/30/2012   TRIG 112.0 06/30/2012   HDL 58.20 06/30/2012   LDLDIRECT 133.2 09/04/2009   LDLCALC 117* 06/30/2012   ALT 17 06/30/2012   ALT 17 06/30/2012   AST 21 06/30/2012   AST 21 06/30/2012   NA 138 06/30/2012   K 3.9 06/30/2012   CL 103 06/30/2012   CREATININE 0.9 06/30/2012   BUN 15 06/30/2012   CO2 27 06/30/2012   TSH 0.66 09/04/2009          Assessment & Plan:

## 2012-07-07 ENCOUNTER — Encounter: Payer: PRIVATE HEALTH INSURANCE | Admitting: Internal Medicine

## 2013-01-23 ENCOUNTER — Other Ambulatory Visit: Payer: Self-pay | Admitting: Internal Medicine

## 2013-03-28 ENCOUNTER — Other Ambulatory Visit: Payer: Self-pay | Admitting: Radiology

## 2013-03-29 ENCOUNTER — Other Ambulatory Visit: Payer: Self-pay | Admitting: Radiology

## 2013-03-29 DIAGNOSIS — C50912 Malignant neoplasm of unspecified site of left female breast: Secondary | ICD-10-CM

## 2013-03-31 ENCOUNTER — Telehealth: Payer: Self-pay | Admitting: *Deleted

## 2013-03-31 ENCOUNTER — Encounter: Payer: Self-pay | Admitting: Internal Medicine

## 2013-03-31 DIAGNOSIS — C50312 Malignant neoplasm of lower-inner quadrant of left female breast: Secondary | ICD-10-CM | POA: Insufficient documentation

## 2013-03-31 DIAGNOSIS — Z170 Estrogen receptor positive status [ER+]: Secondary | ICD-10-CM

## 2013-03-31 NOTE — Telephone Encounter (Signed)
Confirmed [facility] for 01/25/14 at 12N .  Instructions and contact information given. 

## 2013-04-01 ENCOUNTER — Telehealth: Payer: Self-pay

## 2013-04-01 NOTE — Telephone Encounter (Signed)
The patient called and stated she needed a medication called in for her upcoming MRI to help with claustrophobia.

## 2013-04-01 NOTE — Telephone Encounter (Signed)
Ok for Valium 10 mg (Rx for 10mg  tab #1) 30 minutes before study. Will need a driver.

## 2013-04-04 MED ORDER — DIAZEPAM 10 MG PO TABS: 10.0000 mg | ORAL_TABLET | Freq: Once | ORAL | Status: DC

## 2013-04-04 NOTE — Telephone Encounter (Signed)
Left voicemail for patient that Rx will be called to [facility] on [address]

## 2013-04-05 ENCOUNTER — Ambulatory Visit
Admission: RE | Admit: 2013-04-05 | Discharge: 2013-04-05 | Disposition: A | Discharge status: Home / Self Care | Payer: Medicare Other | Source: Ambulatory Visit | Attending: Radiology | Admitting: Radiology

## 2013-04-05 DIAGNOSIS — C50912 Malignant neoplasm of unspecified site of left female breast: Secondary | ICD-10-CM

## 2013-04-05 MED ORDER — GADOBENATE DIMEGLUMINE 529 MG/ML IV SOLN
17.0000 mL | Freq: Once | INTRAVENOUS | Status: AC | PRN
  Administered 2013-04-05: 17 mL via INTRAVENOUS

## 2013-04-06 ENCOUNTER — Encounter: Payer: Self-pay | Admitting: *Deleted

## 2013-04-06 ENCOUNTER — Ambulatory Visit (HOSPITAL_BASED_OUTPATIENT_CLINIC_OR_DEPARTMENT_OTHER): Payer: Medicare Other

## 2013-04-06 ENCOUNTER — Ambulatory Visit (HOSPITAL_BASED_OUTPATIENT_CLINIC_OR_DEPARTMENT_OTHER): Payer: Medicare Other | Admitting: Oncology

## 2013-04-06 ENCOUNTER — Encounter (INDEPENDENT_AMBULATORY_CARE_PROVIDER_SITE_OTHER): Payer: Self-pay | Admitting: General Surgery

## 2013-04-06 ENCOUNTER — Encounter: Payer: Self-pay | Admitting: Oncology

## 2013-04-06 ENCOUNTER — Ambulatory Visit (HOSPITAL_BASED_OUTPATIENT_CLINIC_OR_DEPARTMENT_OTHER): Payer: No Typology Code available for payment source | Admitting: General Surgery

## 2013-04-06 ENCOUNTER — Ambulatory Visit: Payer: PRIVATE HEALTH INSURANCE | Admitting: Physical Therapy

## 2013-04-06 ENCOUNTER — Ambulatory Visit
Admission: RE | Admit: 2013-04-06 | Discharge: 2013-04-06 | Disposition: A | Discharge status: Home / Self Care | Payer: PRIVATE HEALTH INSURANCE | Source: Ambulatory Visit | Attending: Radiation Oncology | Admitting: Radiation Oncology

## 2013-04-06 ENCOUNTER — Other Ambulatory Visit (HOSPITAL_BASED_OUTPATIENT_CLINIC_OR_DEPARTMENT_OTHER): Payer: Medicare Other

## 2013-04-06 ENCOUNTER — Encounter: Payer: Self-pay | Admitting: Radiation Oncology

## 2013-04-06 VITALS — BP 132/78 | HR 88 | Temp 98.9°F | Resp 18 | Ht 63.0 in | Wt 189.6 lb

## 2013-04-06 DIAGNOSIS — C50312 Malignant neoplasm of lower-inner quadrant of left female breast: Secondary | ICD-10-CM

## 2013-04-06 DIAGNOSIS — Z9079 Acquired absence of other genital organ(s): Secondary | ICD-10-CM

## 2013-04-06 DIAGNOSIS — C50319 Malignant neoplasm of lower-inner quadrant of unspecified female breast: Secondary | ICD-10-CM

## 2013-04-06 DIAGNOSIS — E559 Vitamin D deficiency, unspecified: Secondary | ICD-10-CM

## 2013-04-06 DIAGNOSIS — H548 Legal blindness, as defined in USA: Secondary | ICD-10-CM

## 2013-04-06 LAB — COMPREHENSIVE METABOLIC PANEL (CC13)
ALT: 15 U/L (ref 0–55)
AST: 19 U/L (ref 5–34)
Albumin: 3.8 g/dL (ref 3.5–5.0)
Alkaline Phosphatase: 75 U/L (ref 40–150)
Anion Gap: 11 meq/L (ref 3–11)
BUN: 12.9 mg/dL (ref 7.0–26.0)
CO2: 23 meq/L (ref 22–29)
Calcium: 9.3 mg/dL (ref 8.4–10.4)
Chloride: 107 meq/L (ref 98–109)
Creatinine: 0.9 mg/dL (ref 0.6–1.1)
Glucose: 109 mg/dL (ref 70–140)
Potassium: 3.9 meq/L (ref 3.5–5.1)
Sodium: 142 meq/L (ref 136–145)
Total Bilirubin: 0.37 mg/dL (ref 0.20–1.20)
Total Protein: 7.5 g/dL (ref 6.4–8.3)

## 2013-04-06 LAB — CBC WITH DIFFERENTIAL/PLATELET
BASO%: 1.1 % (ref 0.0–2.0)
Basophils Absolute: 0.1 10*3/uL (ref 0.0–0.1)
EOS%: 1.4 % (ref 0.0–7.0)
Eosinophils Absolute: 0.1 10*3/uL (ref 0.0–0.5)
HCT: 34.3 % — ABNORMAL LOW (ref 34.8–46.6)
HGB: 11.2 g/dL — ABNORMAL LOW (ref 11.6–15.9)
LYMPH%: 35.1 % (ref 14.0–49.7)
MCH: 27.9 pg (ref 25.1–34.0)
MCHC: 32.7 g/dL (ref 31.5–36.0)
MCV: 85.1 fL (ref 79.5–101.0)
MONO#: 0.3 10*3/uL (ref 0.1–0.9)
MONO%: 6.6 % (ref 0.0–14.0)
NEUT#: 2.9 10*3/uL (ref 1.5–6.5)
NEUT%: 55.8 % (ref 38.4–76.8)
Platelets: 224 10*3/uL (ref 145–400)
RBC: 4.02 10*6/uL (ref 3.70–5.45)
RDW: 14.7 % — ABNORMAL HIGH (ref 11.2–14.5)
WBC: 5.1 10*3/uL (ref 3.9–10.3)
lymph#: 1.8 10*3/uL (ref 0.9–3.3)

## 2013-04-06 NOTE — Progress Notes (Signed)
Radiation Oncology         [phone] ________________________________  Initial outpatient Consultation  Name: [name] MRN: [id number]  Date: 04/06/2013  DOB: [birth year]  XY:[id number] Norins, MD  [name], MD   REFERRING PHYSICIAN: [name], MD  DIAGNOSIS: There were no encounter diagnoses.  HISTORY OF PRESENT ILLNESS::[name] is a 80 y.o. female who is seen out of the courtesy of Dr. Fanny Skates as part of the multidisciplinary breast clinic. Recently on screening mammography the patient was noted to have a 2 cm mass within the upper inner aspect of the left breast. Was not visible on ultrasound so a stereotactic biopsy was performed revealing grade 1 invasive mammary carcinoma. The tumor was estrogen receptor positive at 99% and progesterone receptor positive at 100%, HER-2 neu negative. The patient proceeded to undergo MRI of the chest area which confirm the 2 cm lesion in the upper inner quadrant. The MRI also showed a spiculated mass in the right breast at approximately the 12:00 position. In addition there were 2 other areas within the right breast felt to be intramammary lymph nodes. Patient is scheduled for a second look ultrasound tomorrow and will undergo further biopsies of the right breast as indicated. With this information the patient is now seen in the multidisciplinary breast clinic.    PREVIOUS RADIATION THERAPY: No  PAST MEDICAL HISTORY:  has a past medical history of VITAMIN D DEFICIENCY (01/25/2009); HYPERLIPIDEMIA (12/14/2006); BLINDNESS, LEGAL, [place] DEFINITION (12/14/2006); EXTERNAL HEMORRHOIDS (09/30/2007); GERD (10/01/2007); CONSTIPATION (12/14/2006); RECTAL BLEEDING (10/01/2007); OSTEOPENIA (02/07/2008); OOPHORECTOMY, RIGHT, HX OF (12/14/2006); ROTATOR CUFF REPAIR, RIGHT, HX OF (12/14/2006); and Anemia.    PAST SURGICAL HISTORY: Past Surgical History  Procedure Laterality Date  . Rotator cuff repair      right  . Right oophorectomy      . Irrigation and debridement sebaceous cyst  06/07    [name]  . Tubal ligation    . Le art doppler   '98    normal  . Nuc stress test   07/07    normal study  . Abdominal hysterectomy      FAMILY HISTORY: family history includes Dementia in her father; Hypertension in her father. There is no history of Cancer or Heart disease.  SOCIAL HISTORY:  reports that she has never smoked. She does not have any smokeless tobacco history on file. She reports that she does not drink alcohol or use illicit drugs.  ALLERGIES: Review of patient's allergies indicates no known allergies.  MEDICATIONS:  Current Outpatient Prescriptions  Medication Sig Dispense Refill  . fexofenadine (ALLEGRA) 180 MG tablet Take 1 tablet (180 mg total) by mouth as needed.  30 tablet  11  . hydrochlorothiazide (HYDRODIURIL) 25 MG tablet TAKE 1 TABLET BY MOUTH AS NEEDED  30 tablet  2  . ibuprofen (ADVIL) 200 MG tablet Take 200 mg by mouth as needed.        . pravastatin (PRAVACHOL) 40 MG tablet TAKE 1 TABLET BY MOUTH AT BEDTIME  30 tablet  5  . ranitidine (ZANTAC) 150 MG tablet Take 150 mg by mouth daily as needed.        No current facility-administered medications for this encounter.    REVIEW OF SYSTEMS:  A 15 point review of systems is documented in the electronic medical record. This was obtained by the nursing staff. However, I reviewed this with the patient to discuss relevant findings and make appropriate changes.  The patient is legally blind  since age 34. She has been told this developed at age 80 from pneumonia.   prior to diagnosis the patient denied any pain in the breast area nipple discharge or bleeding. She denies any new bony pain headaches dizziness or blurred vision.   PHYSICAL EXAM: Vital signs reveal a pulse of 88 respirations 18 temperature is 98.9 blood pressure 132/78 weight is 188 pounds height 5 feet 3 inches BMI 33.7 patient is in general quite pleasant. She is accompanied by her 2 daughters on  evaluation today. Examination of the neck and supraclavicular region reveals no evidence of adenopathy. The axillary areas are free of adenopathy. Examination of the lungs reveals them to be clear. The heart has regular rhythm and rate. Examination of the left breast reveals no mass or nipple discharge. There is a small biopsy site in the upper inner aspect of the left breast. Examination of the right breast reveals no palpable mass nipple discharge or bleeding.   ECOG = 0  0 - Asymptomatic (Fully active, able to carry on all predisease activities without restriction)   LABORATORY DATA:  Lab Results  Component Value Date   WBC 5.1 04/06/2013   HGB 11.2* 04/06/2013   HCT 34.3* 04/06/2013   MCV 85.1 04/06/2013   PLT 224 04/06/2013   Lab Results  Component Value Date   NA 142 04/06/2013   K 3.9 04/06/2013   CL 103 06/30/2012   CO2 23 04/06/2013   Lab Results  Component Value Date   ALT 15 04/06/2013   AST 19 04/06/2013   ALKPHOS 75 04/06/2013   BILITOT 0.37 04/06/2013     RADIOGRAPHY: Mr Breast Bilateral W Wo Contrast  04/05/2013   CLINICAL DATA:  Patient with recent diagnosis left breast carcinoma.  EXAM: BILATERAL BREAST MRI WITH AND WITHOUT CONTRAST  LABS:  BUN and creatinine were obtained on site at [facility] at  [address]  Results:  BUN 12 mg/dL,  Creatinine 1 mg/dL.  TECHNIQUE: Multiplanar, multisequence MR images of both breasts were obtained prior to and following the intravenous administration of 31m of MultiHance.  THREE-DIMENSIONAL MR IMAGE RENDERING ON INDEPENDENT WORKSTATION:  Three-dimensional MR images were rendered by post-processing of the original MR data on an independent workstation. The three-dimensional MR images were interpreted, and findings are reported in the following complete MRI report for this study. Three dimensional images were evaluated at the independent DynaCad workstation  COMPARISON:  Previous exams  FINDINGS: Breast composition: b. Scattered  fibroglandular tissue  Background parenchymal enhancement: Moderate  Right breast: Within the 12 o'clock position right breast middle depth there is a 1.0 x 1.0 x 1.3 cm irregular spiculated mass with washout kinetics. Within the upper-outer quadrant of the right breast anterior depth there is a 6 mm oval enhancing mass with increased T2 signal favored to represent an intramammary lymph node. Additionally within the central upper aspect of the right breast there is a 7 mm oval enhancing mass which is also felt to represent an intramammary lymph node, particularly when compared with prior mammograms. Multiple scattered foci of enhancement are demonstrated.  Left breast: Within the upper inner quadrant of the left breast middle depth there is a 2.0 x 1.5 x 1.6 cm irregular spiculated enhancing mass containing a biopsy marking clip compatible with recently biopsied breast carcinoma. No additional areas of concerning enhancement identified within the left breast.  Lymph nodes: No abnormal appearing lymph nodes.  Ancillary findings:  None.  IMPRESSION: 1. Biopsy-proven carcinoma within the  left breast. 2. Irregular spiculated mass within the 12 o'clock position right breast middle depth with washout kinetics. An attempt at 2nd look ultrasound and ultrasound-guided core needle biopsy can be obtained. If this is not definitively identified with second-look ultrasound, an MRI guided core needle biopsy is recommended.  RECOMMENDATION: Second-look right breast ultrasound. If concerning mass is not identified, MRI guided core needle biopsy is recommended.  BI-RADS CATEGORY  4: Suspicious abnormality - biopsy should be considered.   Electronically Signed   By: Lovey Newcomer M.D.   On: 04/05/2013 11:16      IMPRESSION: Clinical stage I invasive ductal carcinoma of the left breast. As above there is an incomplete evaluation of the right breast which is planned in the near future. Assuming there are no new issues involving the  right breast, the patient would be a candidate for breast conservation therapy with partial mastectomy followed by radiation likely depending on tumor size and adjuvant hormonal therapy. Final management details are pending at this time in light of incomplete evaluation of the right breast.  of care.   ------------------------------------------------  -----------------------------------  Blair Promise, PhD, MD

## 2013-04-06 NOTE — Progress Notes (Signed)
[facility]  Telephone:(336) [phone] Fax:([phone]) [phone]     ID: [name] OB: [birth year]  MR#: [id number]  DVV#:[id number]  PCP: Adella Hare, MD GYN:  Newton Pigg SU: Fanny Skates OTHER MD: Gery Pray  CHIEF COMPLAINT: "I have breast cancer."  HISTORY OF PRESENT ILLNESS: Jazyah had routine screening mammography at [facility] 03/19/2012 showing a potential abnormality in the left breast. Additional views 03/26/2012 showed no discrete mass or architectural distortion. Left breast ultrasonography was negative.  Short interval followup was suggested and done 09/30/2012 the patient had left diagnostic mammography which showed no concerning change.  Bilateral diagnostic mammography 03/22/2013 showed the patient's breast density to be category B. There was an irregular focal asymmetry in the left breast at 8:00, however ultrasound of this area showed no corresponding abnormality. Accordingly a stereotactic biopsy of the area in question in the left breast was performed 03/28/2013, and showed (SAA [id number]) an invasive ductal carcinoma, with lobular features, grade 1, estrogen receptor 99% positive, progesterone receptor 100% positive, both with strong staining intensity, with an MIB-1 of 6%, and no HER-2 amplification, the signals ratio being 1.35 and the copy number per cell 1.75.  MRI of the breast was obtained 04/05/2013. This confirmed a 2 cm irregular spiculated mass in the upper inner quadrant of the left breast with no additional areas of concern in that breast. In the right breast however at the 12:00 position there was a 1.3 cm irregular spiculated mass. There were 2 oval enhancing masses in the right breast felt consistent with intramammary lymph nodes. There were no abnormal appearing lymph nodes otherwise.  The patient's subsequent history is as detailed below  INTERVAL HISTORY: Alaisha was evaluated in the multidisciplinary breast cancer clinic 04/06/2013  accompanied by her daughters Sunday Spillers and Peter Congo.  REVIEW OF SYSTEMS: The patient is blind secondary to an infection in childhood, but she is very functional. There were no specific symptoms leading to the original mammogram, which was routinely scheduled. The patient denies unusual headaches, nausea, vomiting, stiff neck, dizziness, or gait imbalance. There has been some mild seasonal sinus symptoms, but no cough, phlegm production, or pleurisy, no chest pain or pressure, and no change in bladder habits. She has had minimal diarrhea lately. She has occasional joint pains here in there which are not more intense or frequent than prior The patient denies fever, rash, bleeding, unexplained fatigue or unexplained weight loss. A detailed review of systems was otherwise entirely negative.   PAST MEDICAL HISTORY: Past Medical History  Diagnosis Date  . VITAMIN D DEFICIENCY 01/25/2009  . HYPERLIPIDEMIA 12/14/2006  . BLINDNESS, [place], [place] DEFINITION 12/14/2006  . EXTERNAL HEMORRHOIDS 09/30/2007  . GERD 10/01/2007  . CONSTIPATION 12/14/2006  . RECTAL BLEEDING 10/01/2007  . OSTEOPENIA 02/07/2008  . OOPHORECTOMY, RIGHT, HX OF 12/14/2006  . ROTATOR CUFF REPAIR, RIGHT, HX OF 12/14/2006  . Anemia     PAST SURGICAL HISTORY: Past Surgical History  Procedure Laterality Date  . Rotator cuff repair      right  . Right oophorectomy    . Irrigation and debridement sebaceous cyst  06/07    [name]  . Tubal ligation    . Le art doppler   '98    normal  . Nuc stress test   07/07    normal study  . Abdominal hysterectomy      FAMILY HISTORY Family History  Problem Relation Age of Onset  . Hypertension Father   . Dementia Father   . Cancer Neg Hx  Negative for breast or colon cancer  . Heart disease Neg Hx    the patient's father was murdered at the age of 97. The patient's mother died in her sleep at the age of 63. The patient has 3 brothers, 6 sisters. She has one cousin with breast cancer, but does not  know at what age she was diagnosed. She also has a cousin with ovarian cancer, but again does not know at what age it was diagnosed.  GYNECOLOGIC HISTORY:  Menarche age 87, first live birth age 78, the patient is [place] P5. She went through the change of life at age 28. She did not use hormone replacement.   SOCIAL HISTORY:  Mickenzie used to work at [employer], but is now retired. Her husband "Buddie" is also blind. He suffers from significant Alzheimer's disease. The patient lives with her husband and 2 grandchildren, Denny Peon, 69, and 66, 36. The patient's children are Sunday Spillers, who works in [place] in Programmer, applications for nursing agency; Trilby Drummer who works in [place] as a Curator; Surveyor, mining, who works in [place] in Psychologist, educational; Alfonzo, who also lives in [place]; and Dixon, who is a Education officer, environmental. The patient has 6 grandchildren, for great-grandchildren. She attends a local [place]: Not in place; the patient intends to name her daughter Peter Congo as healthcare power of attorney. Peter Congo can be reached at [phone]   HEALTH MAINTENANCE: History  Substance Use Topics  . Smoking status: Never Smoker   . Smokeless tobacco: Not on file  . Alcohol Use: No     Colonoscopy:  PAP:  Bone density:  Lipid panel:  No Known Allergies  Current Outpatient Prescriptions  Medication Sig Dispense Refill  . diazepam (VALIUM) 10 MG tablet Take 1 tablet (10 mg total) by mouth once.  1 tablet  0  . fexofenadine (ALLEGRA) 180 MG tablet Take 1 tablet (180 mg total) by mouth as needed.  30 tablet  11  . hydrochlorothiazide (HYDRODIURIL) 25 MG tablet TAKE 1 TABLET BY MOUTH AS NEEDED  30 tablet  2  . ibuprofen (ADVIL) 200 MG tablet Take 200 mg by mouth as needed.        . pravastatin (PRAVACHOL) 40 MG tablet TAKE 1 TABLET BY MOUTH AT BEDTIME  30 tablet  5  . ranitidine (ZANTAC) 150 MG tablet Take 150 mg by mouth daily as needed.        No current  facility-administered medications for this visit.    OBJECTIVE: Older African American woman in no acute distress Filed Vitals:   04/06/13 1254  BP: 132/78  Pulse: 88  Temp: 98.9 F (37.2 C)  Resp: 18     Body mass index is 33.59 kg/(m^2).    ECOG FS:0 - Asymptomatic  Ear-nose-throat: Oropharynx no thrush or other lesions Lymphatic: No cervical or supraclavicular adenopathy Lungs no rales or rhonchi, good excursion bilaterally Heart regular rate and rhythm, no murmur appreciated Abd soft, nontender, positive bowel sounds MSK no focal spinal tenderness, no joint edema Neuro: non-focal, well-oriented, appropriate affect Breasts: I do not palpate a mass in either breast. There are no skin or nipple changes of concern. Both axillae are benign.   LAB RESULTS:  CMP     Component Value Date/Time   NA 142 04/06/2013 1226   NA 138 06/30/2012 1018   K 3.9 04/06/2013 1226   K 3.9 06/30/2012 1018   CL 103 06/30/2012 1018   CO2 23 04/06/2013 1226   CO2  27 06/30/2012 1018   GLUCOSE 109 04/06/2013 1226   GLUCOSE 98 06/30/2012 1018   BUN 12.9 04/06/2013 1226   BUN 15 06/30/2012 1018   CREATININE 0.9 04/06/2013 1226   CREATININE 0.9 06/30/2012 1018   CALCIUM 9.3 04/06/2013 1226   CALCIUM 9.7 06/30/2012 1018   CALCIUM 9.9 12/24/2010 1018   PROT 7.5 04/06/2013 1226   PROT 7.9 06/30/2012 1018   PROT 7.9 06/30/2012 1018   ALBUMIN 3.8 04/06/2013 1226   ALBUMIN 4.1 06/30/2012 1018   ALBUMIN 4.1 06/30/2012 1018   AST 19 04/06/2013 1226   AST 21 06/30/2012 1018   AST 21 06/30/2012 1018   ALT 15 04/06/2013 1226   ALT 17 06/30/2012 1018   ALT 17 06/30/2012 1018   ALKPHOS 75 04/06/2013 1226   ALKPHOS 68 06/30/2012 1018   ALKPHOS 68 06/30/2012 1018   BILITOT 0.37 04/06/2013 1226   BILITOT 0.5 06/30/2012 1018   BILITOT 0.5 06/30/2012 1018   GFRNONAA 75.78 09/04/2009 1413   GFRAA  Value: >60        The eGFR has been calculated using the MDRD equation. This calculation has not been validated in all clinical situations. eGFR's persistently  <60 mL/min signify possible Chronic Kidney Disease. 11/06/2008 1013    I No results found for this basename: SPEP, UPEP,  kappa and lambda light chains    Lab Results  Component Value Date   WBC 5.1 04/06/2013   NEUTROABS 2.9 04/06/2013   HGB 11.2* 04/06/2013   HCT 34.3* 04/06/2013   MCV 85.1 04/06/2013   PLT 224 04/06/2013      Chemistry      Component Value Date/Time   NA 142 04/06/2013 1226   NA 138 06/30/2012 1018   K 3.9 04/06/2013 1226   K 3.9 06/30/2012 1018   CL 103 06/30/2012 1018   CO2 23 04/06/2013 1226   CO2 27 06/30/2012 1018   BUN 12.9 04/06/2013 1226   BUN 15 06/30/2012 1018   CREATININE 0.9 04/06/2013 1226   CREATININE 0.9 06/30/2012 1018      Component Value Date/Time   CALCIUM 9.3 04/06/2013 1226   CALCIUM 9.7 06/30/2012 1018   CALCIUM 9.9 12/24/2010 1018   ALKPHOS 75 04/06/2013 1226   ALKPHOS 68 06/30/2012 1018   ALKPHOS 68 06/30/2012 1018   AST 19 04/06/2013 1226   AST 21 06/30/2012 1018   AST 21 06/30/2012 1018   ALT 15 04/06/2013 1226   ALT 17 06/30/2012 1018   ALT 17 06/30/2012 1018   BILITOT 0.37 04/06/2013 1226   BILITOT 0.5 06/30/2012 1018   BILITOT 0.5 06/30/2012 1018       No results found for this basename: LABCA2    No components found with this basename: LABCA125    No results found for this basename: INR,  in the last 168 hours  Urinalysis    Component Value Date/Time   COLORURINE YELLOW 05/20/2008 0855   APPEARANCEUR CLOUDY* 05/20/2008 0855   LABSPEC 1.007 05/20/2008 0855   PHURINE 7.0 05/20/2008 [place] 05/20/2008 [place] 05/20/2008 0855   BILIRUBINUR NEGATIVE 05/20/2008 0855   [place] 05/20/2008 0855   PROTEINUR NEGATIVE 05/20/2008 0855   UROBILINOGEN 0.2 05/20/2008 0855   NITRITE NEGATIVE 05/20/2008 0855   LEUKOCYTESUR SMALL* 05/20/2008 0855    STUDIES: Mr Breast Bilateral W Wo Contrast  04/05/2013   CLINICAL DATA:  Patient with recent diagnosis left breast carcinoma.  EXAM: BILATERAL BREAST MRI WITH AND  WITHOUT CONTRAST   LABS:  BUN and creatinine were obtained on site at [facility] at  [address]  Results:  BUN 12 mg/dL,  Creatinine 1 mg/dL.  TECHNIQUE: Multiplanar, multisequence MR images of both breasts were obtained prior to and following the intravenous administration of 72m of MultiHance.  THREE-DIMENSIONAL MR IMAGE RENDERING ON INDEPENDENT WORKSTATION:  Three-dimensional MR images were rendered by post-processing of the original MR data on an independent workstation. The three-dimensional MR images were interpreted, and findings are reported in the following complete MRI report for this study. Three dimensional images were evaluated at the independent DynaCad workstation  COMPARISON:  Previous exams  FINDINGS: Breast composition: b. Scattered fibroglandular tissue  Background parenchymal enhancement: Moderate  Right breast: Within the 12 o'clock position right breast middle depth there is a 1.0 x 1.0 x 1.3 cm irregular spiculated mass with washout kinetics. Within the upper-outer quadrant of the right breast anterior depth there is a 6 mm oval enhancing mass with increased T2 signal favored to represent an intramammary lymph node. Additionally within the central upper aspect of the right breast there is a 7 mm oval enhancing mass which is also felt to represent an intramammary lymph node, particularly when compared with prior mammograms. Multiple scattered foci of enhancement are demonstrated.  Left breast: Within the upper inner quadrant of the left breast middle depth there is a 2.0 x 1.5 x 1.6 cm irregular spiculated enhancing mass containing a biopsy marking clip compatible with recently biopsied breast carcinoma. No additional areas of concerning enhancement identified within the left breast.  Lymph nodes: No abnormal appearing lymph nodes.  Ancillary findings:  None.  IMPRESSION: 1. Biopsy-proven carcinoma within the left breast. 2. Irregular spiculated mass within the 12 o'clock position right breast  middle depth with washout kinetics. An attempt at 2nd look ultrasound and ultrasound-guided core needle biopsy can be obtained. If this is not definitively identified with second-look ultrasound, an MRI guided core needle biopsy is recommended.  RECOMMENDATION: Second-look right breast ultrasound. If concerning mass is not identified, MRI guided core needle biopsy is recommended.  BI-RADS CATEGORY  4: Suspicious abnormality - biopsy should be considered.   Electronically Signed   By: DLovey NewcomerM.D.   On: 04/05/2013 11:16    ASSESSMENT: 80y.o. [place] woman status post left breast biopsy 03/28/2013 for a clinical T1c N0, stage IA invasive (lobular) carcinoma, grade 1, estrogen and progesterone receptor positive, HER-2 negative, with an MIB-1 of 6%  (1) a 1.3 cm lesion noted by MRI in the right breast is pending further evaluation and possible biopsy  PLAN: We spent the better part of today's hour-long appointment discussing the biology of breast cancer in general, and the specifics of the patient's tumor in particular. I discussed with the patient and her daughters the fact that treatment of breast cancer has local control and systemic components. She will certainly benefit from surgery and radiation which are local treatment options.  As far as systemic therapy is concerned she will be an excellent candidate for anti-estrogens. She is not a candidate for anti HER-2 treatment. The question then is chemotherapy. NCCN guidelines suggest that an Oncotype DX be sent in these cases, however they also caution that for patients older than 70 the weight of their recommendations is more questionable. According to the adjuvant! Program the reduction in mortality from aggressive chemotherapy in this patient would be at most 1% even if the cancer proved to be lymph node positive. The  reduction in recurrence would be about 3%.  Accordingly I am comfortable not sampling the sentinel lymph nodes as chemotherapy is  not planned. The patient will proceed to surgery, then radiation. I anticipate she will be ready to see me again in early April. Before that visit I will obtain a bone density to help [place] decide which antiestrogen to start with.  The patient has a good understanding of the overall plan, and agrees with it. She knows the goal of treatment in her case is cure. She will call with any problems that may develop before her next visit here.  Chauncey Cruel, MD   04/06/2013 2:37 PM

## 2013-04-06 NOTE — Progress Notes (Signed)
Patient ID: [name], female   DOB: 1933-04-26, 80 y.o.   MRN: [id number]  No chief complaint on file.   HPI [name] is a 80 y.o. female.  She is referred by Dr. Christene Slates at [facility] mammography for evaluation and management of a newly diagnosed invasive cancer of the left breast, upper inner quadrant. Dr. Adella Hare is her PCP. She is being evaluated in the [facility] today by Dr. Sondra Come, Dr. Jana Hakim, and me.   She has been legally blind since birth. She is here with her 2 daughters. She gets yearly mammograms. Recent mammograms show a approximate 2 cm mass in the left breast in the upper inner quadrant. This was not visible on ultrasound so a stereotactic biopsy was performed revealing a grade 1 invasive mammary carcinoma, possibly lobular. ER-positive 99%, PR-positive 100%, HER-2-negative.  Subsequent MRI shows a biopsy-proven cancer in the left breast, 2 cm, upper inner quadrant, solitary finding. The MRI also showed a 1.2 cm suspicious, spiculated mass in the right breast at the 12:00 position. There were 2 other areas in his of the right breast probably intramammary lymph nodes. She is scheduled for second look ultrasound on 04/07/2013 and will go for further biopsies of the right breast after that.  Comorbidities include blindness, hyperlipidemia, GERD, history of right salpingo-oophorectomy for benign cyst.  His history is negative for colon cancer but is positive for breast cancer in 3 paternal cousins.  Socially she lives with her husband but he has Alzheimer's disease. She   takes care of him, despite her blindness. HPI  Past Medical History  Diagnosis Date  . VITAMIN D DEFICIENCY 01/25/2009  . HYPERLIPIDEMIA 12/14/2006  . BLINDNESS, [place], [place] DEFINITION 12/14/2006  . EXTERNAL HEMORRHOIDS 09/30/2007  . GERD 10/01/2007  . CONSTIPATION 12/14/2006  . RECTAL BLEEDING 10/01/2007  . OSTEOPENIA 02/07/2008  . OOPHORECTOMY, RIGHT, HX OF 12/14/2006  . ROTATOR CUFF REPAIR,  RIGHT, HX OF 12/14/2006  . Anemia     Past Surgical History  Procedure Laterality Date  . Rotator cuff repair      right  . Right oophorectomy    . Irrigation and debridement sebaceous cyst  06/07    [name]  . Tubal ligation    . Le art doppler   '98    normal  . Nuc stress test   07/07    normal study  . Abdominal hysterectomy      Family History  Problem Relation Age of Onset  . Hypertension Father   . Dementia Father   . Cancer Neg Hx     Negative for breast or colon cancer  . Heart disease Neg Hx     Social History History  Substance Use Topics  . Smoking status: Never Smoker   . Smokeless tobacco: Not on file  . Alcohol Use: No    No Known Allergies  Current Outpatient Prescriptions  Medication Sig Dispense Refill  . fexofenadine (ALLEGRA) 180 MG tablet Take 1 tablet (180 mg total) by mouth as needed.  30 tablet  11  . hydrochlorothiazide (HYDRODIURIL) 25 MG tablet TAKE 1 TABLET BY MOUTH AS NEEDED  30 tablet  2  . ibuprofen (ADVIL) 200 MG tablet Take 200 mg by mouth as needed.        . pravastatin (PRAVACHOL) 40 MG tablet TAKE 1 TABLET BY MOUTH AT BEDTIME  30 tablet  5  . ranitidine (ZANTAC) 150 MG tablet Take 150 mg by mouth daily as needed.  No current facility-administered medications for this visit.    Review of Systems Review of Systems  Constitutional: Negative for fever, chills and unexpected weight change.  HENT: Positive for sinus pressure. Negative for congestion, hearing loss, sore throat, trouble swallowing and voice change.   Eyes: Negative for visual disturbance.  Respiratory: Negative for cough and wheezing.   Cardiovascular: Negative for chest pain, palpitations and leg swelling.  Gastrointestinal: Negative for nausea, vomiting, abdominal pain, diarrhea, constipation, blood in stool, abdominal distention and anal bleeding.  Genitourinary: Negative for hematuria, vaginal bleeding and difficulty urinating.  Musculoskeletal: Positive  for arthralgias and myalgias.  Skin: Negative for rash and wound.  Neurological: Negative for seizures, syncope and headaches.  Hematological: Negative for adenopathy. Does not bruise/bleed easily.  Psychiatric/Behavioral: Negative for confusion.    There were no vitals taken for this visit.  Physical Exam Physical Exam  Constitutional: She is oriented to person, place, and time. She appears well-developed and well-nourished. No distress.  2 daughters are with her throughout the encounter  HENT:  Head: Normocephalic and atraumatic.  Nose: Nose normal.  Mouth/Throat: No oropharyngeal exudate.  Eyes:  No light perception  Neck: Neck supple. No JVD present. No tracheal deviation present. No thyromegaly present.  Cardiovascular: Normal rate, regular rhythm, normal heart sounds and intact distal pulses.   No murmur heard. Pulmonary/Chest: Effort normal and breath sounds normal. No respiratory distress. She has no wheezes. She has no rales. She exhibits no tenderness.  Bruised left breast, upper inner, but no palpable mass.  Abdominal: Soft. Bowel sounds are normal. She exhibits no distension and no mass. There is no tenderness. There is no rebound and no guarding.  Lower midline scar  Musculoskeletal: She exhibits no edema and no tenderness.  Lymphadenopathy:    She has no cervical adenopathy.  Neurological: She is alert and oriented to person, place, and time. She exhibits normal muscle tone. Coordination normal.  Skin: Skin is warm. No rash noted. She is not diaphoretic. No erythema. No pallor.  Psychiatric: She has a normal mood and affect. Her behavior is normal. Judgment and thought content normal.    Data Reviewed I have reviewed her imaging studies, and histology, and breast diagnostic profile. I have discussed her case in breast conference this morning. I have coordinated her treatment plan with Dr. Sondra Come and Dr. Jana Hakim.  Assessment    Grade 1 invasive mammary carcinoma  left breast, upper inner quadrant, 2 cm by MRI. ER-positive, PR positive, HER-2/neu negative.  On the left side, she is a candidate for left partial mastectomy with needle localization. I have advised against a sentinel lymph node biopsy since she is not a candidate for chemotherapy. I have discussed this with Dr. Jana Hakim.  Suspicious 1.3 cm density right breast, central, 12:00. Suspicious, further evaluation indicated at this time.   Blindness  GERD  Hyperlipidemia  Family history breast cancer 3 paternal cousins.          Plan    If she has bilateral breast cancer, she should be referred to genetics.  She is scheduled for second look ultrasound of the right breast on 04/07/2013 at [place]. Ultrasound-guided biopsy will be performed if this can be visualized. If not she will need MRI guided biopsy.  I spent a  long time discussing surgical options with the patient and her 2 daughters. We talked about lumpectomy, mastectomy. We talked about lymph node biopsy and the rationale for  foregoing that. They understand all these issues. They understand that  we will need to evaluate the right breast mass before finalizing a definitive surgical treatment plan.  She will return to see me in one week, approximately, after the right breast has been fully evaluated and biopsied.       Edsel Petrin. Dalbert Batman, M.D., [facility] General and Minimally invasive Surgery Breast and Colorectal Surgery Office:   [phone] Pager:   [phone]  04/06/2013, 4:46 PM

## 2013-04-06 NOTE — Patient Instructions (Signed)
You have been diagnosed with an invasive cancer of the left breast in the upper inner quadrant, 2 cm in diameter.  You have also had an MRI which shows a 1.3 cm of suspicious enhancement in the right breast centrally at the 12:00 position. This area needs to be biopsied before any further decisions are made.  Assuming that nothing else is found, you are a good candidate for breast conservation on the left.  This means that you could have a partial mastectomy on the left and not have the entire  breast removed.  We do not think that you will need to have any lymph node biopsies.  Return to see Dr. Dalbert Batman in approximately 10 days, on January 26 for further discussion and hopefully, final treatment planning.

## 2013-04-06 NOTE — Progress Notes (Signed)
Checked in new patient with no financial issues. She has appt card and [facility] form.

## 2013-04-07 ENCOUNTER — Encounter: Payer: Self-pay | Admitting: *Deleted

## 2013-04-07 ENCOUNTER — Other Ambulatory Visit: Payer: Self-pay | Admitting: Radiology

## 2013-04-07 DIAGNOSIS — R928 Other abnormal and inconclusive findings on diagnostic imaging of breast: Secondary | ICD-10-CM

## 2013-04-07 NOTE — Progress Notes (Signed)
[place] Psychosocial Distress Screening Clinical Social Work  Clinical Social Work was referred by distress screening protocol and seen in Breast Clinic.  The patient scored a 5 on the Psychosocial Distress Thermometer which indicates moderate distress. Clinical Social Worker met with Pt at Breast Clinic to assess for distress and other psychosocial needs. Pt feels more at ease since meeting with medical team today. She is vision impaired and her daughters accompanied her to clinic today. Daughters report they usually read information for Pt. CSW provided Pt and family with Support Team literature on assistance/resources available. Pt also offered an Network engineer. Pt declines currently.    Clinical Social Worker follow up needed: no  Loren Racer, [place] Social Worker [facility] for [place] Wednesday, Thursday and Friday Phone: [phone] Fax: [phone]

## 2013-04-12 ENCOUNTER — Telehealth: Payer: Self-pay | Admitting: *Deleted

## 2013-04-12 NOTE — Telephone Encounter (Signed)
Called and spoke with patient from [facility] 04/06/13.  No questions or concerns at this time.

## 2013-04-18 ENCOUNTER — Encounter (INDEPENDENT_AMBULATORY_CARE_PROVIDER_SITE_OTHER): Payer: No Typology Code available for payment source | Admitting: General Surgery

## 2013-04-18 ENCOUNTER — Telehealth: Payer: Self-pay | Admitting: *Deleted

## 2013-04-18 NOTE — Telephone Encounter (Signed)
Faxed Care Plan and office notes to [facility] at [facility] & to the PCP.  Took Care Plan to Med Rec to scan.

## 2013-04-19 ENCOUNTER — Encounter: Payer: Self-pay | Admitting: Internal Medicine

## 2013-04-20 ENCOUNTER — Encounter (INDEPENDENT_AMBULATORY_CARE_PROVIDER_SITE_OTHER): Payer: Self-pay

## 2013-04-20 ENCOUNTER — Ambulatory Visit
Admission: RE | Admit: 2013-04-20 | Discharge: 2013-04-20 | Disposition: A | Discharge status: Home / Self Care | Payer: Medicare Other | Source: Ambulatory Visit | Attending: Radiology | Admitting: Radiology

## 2013-04-20 DIAGNOSIS — R928 Other abnormal and inconclusive findings on diagnostic imaging of breast: Secondary | ICD-10-CM

## 2013-04-20 MED ORDER — GADOBENATE DIMEGLUMINE 529 MG/ML IV SOLN
17.0000 mL | Freq: Once | INTRAVENOUS | Status: AC | PRN
  Administered 2013-04-20: 17 mL via INTRAVENOUS

## 2013-04-25 ENCOUNTER — Ambulatory Visit (INDEPENDENT_AMBULATORY_CARE_PROVIDER_SITE_OTHER): Payer: No Typology Code available for payment source | Admitting: General Surgery

## 2013-04-25 ENCOUNTER — Encounter (INDEPENDENT_AMBULATORY_CARE_PROVIDER_SITE_OTHER): Payer: Self-pay | Admitting: General Surgery

## 2013-04-25 VITALS — BP 104/70 | HR 88 | Temp 98.5°F | Resp 16 | Ht 60.0 in | Wt 189.2 lb

## 2013-04-25 DIAGNOSIS — C50312 Malignant neoplasm of lower-inner quadrant of left female breast: Secondary | ICD-10-CM

## 2013-04-25 DIAGNOSIS — C50319 Malignant neoplasm of lower-inner quadrant of unspecified female breast: Secondary | ICD-10-CM

## 2013-04-25 NOTE — Patient Instructions (Signed)
Your second left breast biopsy is completely benign, and we do not have to do any surgery to remove that area.  We have talked about all of your options in great detail. We have decided to proceed with a lumpectomy and sentinel node biopsy and post op radiation therapy.  You will be scheduled for a left partial mastectomy with needle localization and left axillary sentinel node biopsy.     Lumpectomy A lumpectomy is a form of "breast conserving" or "breast preservation" surgery. It may also be referred to as a partial mastectomy. During a lumpectomy, the portion of the breast that contains the cancerous tumor or breast mass (the lump) is removed. Some normal tissue around the lump may also be removed to make sure all the tumor has been removed. This surgery should take 40 minutes or less. LET [facility] CARE PROVIDER KNOW ABOUT:  Any allergies you have.  All medicines you are taking, including vitamins, herbs, eye drops, creams, and over-the-counter medicines.  Previous problems you or members of your family have had with the use of anesthetics.  Any blood disorders you have.  Previous surgeries you have had.  Medical conditions you have. RISKS AND COMPLICATIONS Generally, this is a safe procedure. However, as with any procedure, complications can occur. Possible complications include:  Bleeding.  Infection.  Pain.  Temporary swelling.  Change in the shape of the breast, particularly if a large portion is removed. BEFORE THE PROCEDURE  Ask your health care provider about changing or stopping your regular medicines.  Do not eat or drink anything for 7 8 hours before the surgery or as directed by your health care provider. Ask your health care provider if you can take a sip of water with any approved medicines.  On the day of surgery, your healthcare provider will use a mammogram or ultrasound to locate and mark the tumor in your breast. These markings on your breast will  show where the cut (incision) will be made. PROCEDURE   An IV tube will be put into one of your veins.  You may be given medicine to help you relax before the surgery (sedative). You will be given one of the following:  A medicine that numbs the area (local anesthesia).  A medicine that makes you go to sleep (general anesthesia).  Your health care provider will use a kind of electric scalpel that uses heat to minimize bleeding (electrocautery knife).  A curved incision (like a smile or frown) that follows the natural curve of your breast is made, to allow for minimal scarring and better healing.  The tumor will be removed with some of the surrounding tissue. This will be sent to the lab for analysis. Your health care provider may also remove your lymph nodes at this time if needed.  Sometimes, but not always, a rubber tube called a drain will be surgically inserted into your breast area or armpit to collect excess fluid that may accumulate in the space where the tumor was. This drain is connected to a plastic bulb on the outside of your body. This drain creates suction to help remove the fluid.  The incisions will be closed with stitches (sutures).  A bandage may be placed over the incisions. AFTER THE PROCEDURE  You will be taken to the recovery area.  You will be given medicine for pain.  A small rubber drain may be placed in the breast for 2 3 days to prevent a collection of blood (hematoma) from developing  in the breast. You will be given instructions on caring for the drain before you go home.  A pressure bandage (dressing) will be applied for 1 2 days to prevent bleeding. Ask your health care provider how to care for your bandage at home. Document Released: 04/21/2006 Document Revised: 11/10/2012 Document Reviewed: 08/13/2012 [facility] Patient Information 2014 [place].

## 2013-04-25 NOTE — Progress Notes (Signed)
Patient ID: [name], female   DOB: [birth year], 80 y.o.   MRN: [id number] History: This patient returns with her daughter for  further discussion about management of her left breast cancer.  Her initial presentation is summarized as follows:     [name] is a 80 y.o. female. She was referred by Dr. Christene Slates at [facility] mammography for evaluation and management of a newly diagnosed invasive cancer of the left breast, upper inner quadrant. Dr. Adella Hare is her PCP. She was recently  evaluated in the [facility]  by Dr. Sondra Come, Dr. Jana Hakim, and me.  She has been legally blind since birth.  She gets yearly mammograms. Recent mammograms show a approximate 2 cm mass in the left breast in the upper inner quadrant. This was not visible on ultrasound so a stereotactic biopsy was performed revealing a grade 1 invasive mammary carcinoma, possibly lobular. ER-positive 99%, PR-positive 100%, HER-2-negative.  Subsequent MRI shows a biopsy-proven cancer in the left breast, 2 cm, upper inner quadrant, solitary finding. The MRI also showed a 1.2 cm suspicious, spiculated mass in the right breast at the 12:00 position. Subsequent biopsy of that area reveals benign fibrocystic change, usual ductal hyperplasia, microcalcifications, and fibroadenoma.There were 2 other areas in his of the right breast probably intramammary lymph nodes.     She is interested in breast conservation..     Comorbidities include blindness, hyperlipidemia, GERD, history of right salpingo-oophorectomy for benign cyst.  His history is negative for colon cancer but is positive for breast cancer in 3 paternal cousins.  Socially she lives with her husband but he has Alzheimer's disease. She takes care of him, despite her blindness.  Past history, social history, family history, and review of systems are documented in the chart, unchanged, and noncontributory except as described above.  Exam: Constitutional: She is oriented to  person, place, and time. She appears well-developed and well-nourished. No distress.  One daughter is  with her throughout the encounter  HENT:  Head: Normocephalic and atraumatic.  Nose: Nose normal.  Mouth/Throat: No oropharyngeal exudate.  Eyes:  No light perception  Neck: Neck supple. No JVD present. No tracheal deviation present. No thyromegaly present.  Cardiovascular: Normal rate, regular rhythm, normal heart sounds and intact distal pulses.  No murmur heard.  Pulmonary/Chest: Effort normal and breath sounds normal. No respiratory distress. She has no wheezes. She has no rales. She exhibits no tenderness.  Bruised left breast, upper inner, but no palpable mass.  Abdominal: Soft. Bowel sounds are normal. She exhibits no distension and no mass. There is no tenderness. There is no rebound and no guarding.  Lower midline scar  Musculoskeletal: She exhibits no edema and no tenderness.  Lymphadenopathy:  She has no cervical adenopathy.  Neurological: She is alert and oriented to person, place, and time. She exhibits normal muscle tone. Coordination normal.  Skin: Skin is warm. No rash noted. She is not diaphoretic. No erythema. No pallor.  Psychiatric: She has a normal mood and affect. Her behavior is normal. Judgment and thought content normal.   Assessment  Grade 1 invasive mammary carcinoma left breast, upper inner quadrant, 2 cm by MRI. ER-positive, PR positive, HER-2/neu negative.  On the left side, she is a candidate for left partial mastectomy with needle localization. I have advised against a sentinel lymph node biopsy since she is not a candidate for chemotherapy. I have discussed this with Dr. Jana Hakim, who agrees. Suspicious 1.3 cm density right breast, central, 12:00. Suspicious, further  evaluation indicated at this time.  Blindness  GERD  Hyperlipidemia  Family history breast cancer 3 paternal cousins.  Plan: Scheduled for left partial mastectomy with needle  localization. I discussed the indications, details, techniques, and numerous risk of the surgery with her and her daughter. She is aware of the risk of bleeding, infection, cosmetic deformity, reoperation for positive margins, skin necrosis, nerve damage with chronic pain, and other unforeseen problems. She understands all these issues all her questions were answered. She agrees with this plan.    Edsel Petrin. Dalbert Batman, M.D., [facility] General and Minimally invasive Surgery Breast and Colorectal Surgery Office:   [phone] Pager:   [phone]

## 2013-04-28 ENCOUNTER — Encounter: Payer: Self-pay | Admitting: Internal Medicine

## 2013-05-05 ENCOUNTER — Encounter (HOSPITAL_BASED_OUTPATIENT_CLINIC_OR_DEPARTMENT_OTHER): Payer: Self-pay | Admitting: *Deleted

## 2013-05-05 NOTE — Progress Notes (Signed)
Daughter will bring her for her preop labs and ekg-pt is legally blind-cares for husband with dementia

## 2013-05-08 NOTE — H&P (Signed)
History:  This patient returns with her daughter for further discussion about management of her left breast cancer.  Her initial presentation is summarized as follows:  AMAN BATLEY is a 80 y.o. female. She was referred by Dr. Christene Slates at [facility] mammography for evaluation and management of a newly diagnosed invasive cancer of the left breast, upper inner quadrant. Dr. Adella Hare is her PCP. She was recently evaluated in the [facility] by Dr. Sondra Come, Dr. Jana Hakim, and me.  She has been legally blind since birth. She gets yearly mammograms. Recent mammograms show a approximate 2 cm mass in the left breast in the upper inner quadrant. This was not visible on ultrasound so a stereotactic biopsy was performed revealing a grade 1 invasive mammary carcinoma, possibly lobular. ER-positive 99%, PR-positive 100%, HER-2-negative.  Subsequent MRI shows a biopsy-proven cancer in the left breast, 2 cm, upper inner quadrant, solitary finding. The MRI also showed a 1.2 cm suspicious, spiculated mass in the right breast at the 12:00 position. Subsequent biopsy of that area reveals benign fibrocystic change, usual ductal hyperplasia, microcalcifications, and fibroadenoma.There were 2 other areas in his of the right breast probably intramammary lymph nodes.  She is interested in breast conservation..  Comorbidities include blindness, hyperlipidemia, GERD, history of right salpingo-oophorectomy for benign cyst.  His history is negative for colon cancer but is positive for breast cancer in 3 paternal cousins.  Socially she lives with her husband but he has Alzheimer's disease. She takes care of him, despite her blindness.    Past history, social history, family history, and review of systems are documented in the chart, unchanged, and noncontributory except as described above.    Exam:  Constitutional: She is oriented to person, place, and time. She appears well-developed and well-nourished. No distress.  One  daughter is with her throughout the encounter  HENT:  Head: Normocephalic and atraumatic.  Nose: Nose normal.  Mouth/Throat: No oropharyngeal exudate.  Eyes:  No light perception  Neck: Neck supple. No JVD present. No tracheal deviation present. No thyromegaly present.  Cardiovascular: Normal rate, regular rhythm, normal heart sounds and intact distal pulses.  No murmur heard.  Pulmonary/Chest: Effort normal and breath sounds normal. No respiratory distress. She has no wheezes. She has no rales. She exhibits no tenderness.  Bruised left breast, upper inner, but no palpable mass.  Abdominal: Soft. Bowel sounds are normal. She exhibits no distension and no mass. There is no tenderness. There is no rebound and no guarding.  Lower midline scar  Musculoskeletal: She exhibits no edema and no tenderness.  Lymphadenopathy:  She has no cervical adenopathy.  Neurological: She is alert and oriented to person, place, and time. She exhibits normal muscle tone. Coordination normal.  Skin: Skin is warm. No rash noted. She is not diaphoretic. No erythema. No pallor.  Psychiatric: She has a normal mood and affect. Her behavior is normal. Judgment and thought content normal.    Assessment  Grade 1 invasive mammary carcinoma left breast, upper inner quadrant, 2 cm by MRI. ER-positive, PR positive, HER-2/neu negative.  On the left side, she is a candidate for left partial mastectomy with needle localization. I have advised against a sentinel lymph node biopsy since she is not a candidate for chemotherapy. I have discussed this with Dr. Jana Hakim, who agrees.  Suspicious 1.3 cm density right breast, central, 12:00. Suspicious, further evaluation indicated at this time.  Blindness  GERD  Hyperlipidemia  Family history breast cancer 3 paternal cousins.  Plan:  Scheduled for left partial mastectomy with needle localization.  I discussed the indications, details, techniques, and numerous risk of the  surgery with her and her daughter. She is aware of the risk of bleeding, infection, cosmetic deformity, reoperation for positive margins, skin necrosis, nerve damage with chronic pain, and other unforeseen problems. She understands all these issues all her questions were answered. She agrees with this plan.       Edsel Petrin. Dalbert Batman, M.D., [facility]  General and Minimally invasive Surgery  Breast and Colorectal Surgery  Office: [phone]  Pager: [phone]

## 2013-05-09 ENCOUNTER — Encounter (HOSPITAL_BASED_OUTPATIENT_CLINIC_OR_DEPARTMENT_OTHER)
Admission: RE | Admit: 2013-05-09 | Discharge: 2013-05-09 | Disposition: A | Discharge status: Home / Self Care | Payer: Medicare Other | Source: Ambulatory Visit | Attending: General Surgery | Admitting: General Surgery

## 2013-05-09 LAB — CBC WITH DIFFERENTIAL/PLATELET
Basophils Absolute: 0 10*3/uL (ref 0.0–0.1)
Basophils Relative: 0 % (ref 0–1)
Eosinophils Absolute: 0.1 10*3/uL (ref 0.0–0.7)
Eosinophils Relative: 2 % (ref 0–5)
HCT: 34.7 % — ABNORMAL LOW (ref 36.0–46.0)
HEMOGLOBIN: 11.6 g/dL — AB (ref 12.0–15.0)
LYMPHS ABS: 2.2 10*3/uL (ref 0.7–4.0)
Lymphocytes Relative: 37 % (ref 12–46)
MCH: 28.2 pg (ref 26.0–34.0)
MCHC: 33.4 g/dL (ref 30.0–36.0)
MCV: 84.2 fL (ref 78.0–100.0)
MONOS PCT: 9 % (ref 3–12)
Monocytes Absolute: 0.5 10*3/uL (ref 0.1–1.0)
NEUTROS ABS: 3.1 10*3/uL (ref 1.7–7.7)
NEUTROS PCT: 52 % (ref 43–77)
Platelets: 248 10*3/uL (ref 150–400)
RBC: 4.12 MIL/uL (ref 3.87–5.11)
RDW: 15 % (ref 11.5–15.5)
WBC: 5.9 10*3/uL (ref 4.0–10.5)

## 2013-05-09 LAB — COMPREHENSIVE METABOLIC PANEL
ALK PHOS: 81 U/L (ref 39–117)
ALT: 19 U/L (ref 0–35)
AST: 23 U/L (ref 0–37)
Albumin: 3.9 g/dL (ref 3.5–5.2)
BUN: 18 mg/dL (ref 6–23)
CO2: 25 mEq/L (ref 19–32)
Calcium: 9.9 mg/dL (ref 8.4–10.5)
Chloride: 104 mEq/L (ref 96–112)
Creatinine, Ser: 0.88 mg/dL (ref 0.50–1.10)
GFR calc non Af Amer: 62 mL/min — ABNORMAL LOW (ref >90)
GFR, EST AFRICAN AMERICAN: 72 mL/min — AB (ref >90)
GLUCOSE: 100 mg/dL — AB (ref 70–99)
POTASSIUM: 4.4 meq/L (ref 3.7–5.3)
Sodium: 143 mEq/L (ref 137–147)
Total Bilirubin: 0.3 mg/dL (ref 0.3–1.2)
Total Protein: 8.2 g/dL (ref 6.0–8.3)

## 2013-05-09 LAB — URINE MICROSCOPIC-ADD ON

## 2013-05-09 LAB — URINALYSIS, ROUTINE W REFLEX MICROSCOPIC
BILIRUBIN URINE: NEGATIVE
Glucose, UA: NEGATIVE mg/dL
Hgb urine dipstick: NEGATIVE
Ketones, ur: NEGATIVE mg/dL
Nitrite: NEGATIVE
PROTEIN: NEGATIVE mg/dL
Specific Gravity, Urine: 1.025 (ref 1.005–1.030)
Urobilinogen, UA: 0.2 mg/dL (ref 0.0–1.0)
pH: 6 (ref 5.0–8.0)

## 2013-05-09 NOTE — Progress Notes (Signed)
EKG shown to DR. Ola Spurr - OK for surgery.

## 2013-05-11 ENCOUNTER — Encounter (HOSPITAL_BASED_OUTPATIENT_CLINIC_OR_DEPARTMENT_OTHER): Payer: Medicare Other | Admitting: Anesthesiology

## 2013-05-11 ENCOUNTER — Ambulatory Visit (HOSPITAL_BASED_OUTPATIENT_CLINIC_OR_DEPARTMENT_OTHER)
Admission: RE | Admit: 2013-05-11 | Discharge: 2013-05-11 | Disposition: A | Discharge status: Home / Self Care | Payer: Medicare Other | Source: Ambulatory Visit | Attending: General Surgery | Admitting: General Surgery

## 2013-05-11 ENCOUNTER — Encounter (HOSPITAL_BASED_OUTPATIENT_CLINIC_OR_DEPARTMENT_OTHER): Payer: Self-pay | Admitting: Anesthesiology

## 2013-05-11 ENCOUNTER — Encounter (HOSPITAL_BASED_OUTPATIENT_CLINIC_OR_DEPARTMENT_OTHER)
Admission: RE | Disposition: A | Discharge status: Home / Self Care | Payer: Self-pay | Source: Ambulatory Visit | Attending: General Surgery

## 2013-05-11 ENCOUNTER — Ambulatory Visit (HOSPITAL_BASED_OUTPATIENT_CLINIC_OR_DEPARTMENT_OTHER): Payer: Medicare Other | Admitting: Anesthesiology

## 2013-05-11 DIAGNOSIS — H548 Legal blindness, as defined in USA: Secondary | ICD-10-CM | POA: Insufficient documentation

## 2013-05-11 DIAGNOSIS — Z171 Estrogen receptor negative status [ER-]: Secondary | ICD-10-CM | POA: Insufficient documentation

## 2013-05-11 DIAGNOSIS — D649 Anemia, unspecified: Secondary | ICD-10-CM | POA: Insufficient documentation

## 2013-05-11 DIAGNOSIS — I10 Essential (primary) hypertension: Secondary | ICD-10-CM | POA: Insufficient documentation

## 2013-05-11 DIAGNOSIS — Z170 Estrogen receptor positive status [ER+]: Secondary | ICD-10-CM

## 2013-05-11 DIAGNOSIS — C50312 Malignant neoplasm of lower-inner quadrant of left female breast: Secondary | ICD-10-CM

## 2013-05-11 DIAGNOSIS — D0590 Unspecified type of carcinoma in situ of unspecified breast: Secondary | ICD-10-CM

## 2013-05-11 DIAGNOSIS — K219 Gastro-esophageal reflux disease without esophagitis: Secondary | ICD-10-CM | POA: Insufficient documentation

## 2013-05-11 DIAGNOSIS — E785 Hyperlipidemia, unspecified: Secondary | ICD-10-CM | POA: Insufficient documentation

## 2013-05-11 HISTORY — DX: Unspecified visual loss: H54.7

## 2013-05-11 HISTORY — PX: PARTIAL MASTECTOMY WITH NEEDLE LOCALIZATION: SHX6008

## 2013-05-11 HISTORY — DX: Edema, unspecified: R60.9

## 2013-05-11 SURGERY — PARTIAL MASTECTOMY WITH NEEDLE LOCALIZATION
Anesthesia: General | Site: Breast | Laterality: Left

## 2013-05-11 MED ORDER — FENTANYL CITRATE 0.05 MG/ML IJ SOLN
INTRAMUSCULAR | Status: AC
  Filled 2013-05-11: qty 4

## 2013-05-11 MED ORDER — BUPIVACAINE-EPINEPHRINE 0.5% -1:200000 IJ SOLN
INTRAMUSCULAR | Status: DC | PRN
  Administered 2013-05-11: 8 mL

## 2013-05-11 MED ORDER — EPHEDRINE SULFATE 50 MG/ML IJ SOLN
INTRAMUSCULAR | Status: DC | PRN
  Administered 2013-05-11 (×2): 10 mg via INTRAVENOUS

## 2013-05-11 MED ORDER — PROPOFOL 10 MG/ML IV BOLUS
INTRAVENOUS | Status: DC | PRN
  Administered 2013-05-11: 200 mg via INTRAVENOUS

## 2013-05-11 MED ORDER — FENTANYL CITRATE 0.05 MG/ML IJ SOLN
INTRAMUSCULAR | Status: DC | PRN
  Administered 2013-05-11: 50 ug via INTRAVENOUS

## 2013-05-11 MED ORDER — CHLORHEXIDINE GLUCONATE 4 % EX LIQD: 1.0000 "application " | Freq: Once | CUTANEOUS | Status: DC

## 2013-05-11 MED ORDER — MIDAZOLAM HCL 2 MG/2ML IJ SOLN: 1.0000 mg | INTRAMUSCULAR | Status: DC | PRN

## 2013-05-11 MED ORDER — CEFAZOLIN SODIUM-DEXTROSE 2-3 GM-% IV SOLR
INTRAVENOUS | Status: AC
Start: 2013-05-11 — End: 2013-05-11
  Filled 2013-05-11: qty 50

## 2013-05-11 MED ORDER — FENTANYL CITRATE 0.05 MG/ML IJ SOLN: 50.0000 ug | INTRAMUSCULAR | Status: DC | PRN

## 2013-05-11 MED ORDER — CEFAZOLIN SODIUM-DEXTROSE 2-3 GM-% IV SOLR
2.0000 g | INTRAVENOUS | Status: AC
  Administered 2013-05-11: 2 g via INTRAVENOUS

## 2013-05-11 MED ORDER — METOCLOPRAMIDE HCL 5 MG/ML IJ SOLN: 10.0000 mg | Freq: Once | INTRAMUSCULAR | Status: DC | PRN

## 2013-05-11 MED ORDER — HYDROCODONE-ACETAMINOPHEN 5-325 MG PO TABS: 1.0000 | ORAL_TABLET | ORAL | Status: DC | PRN

## 2013-05-11 MED ORDER — OXYCODONE HCL 5 MG PO TABS
ORAL_TABLET | ORAL | Status: AC
  Filled 2013-05-11: qty 1

## 2013-05-11 MED ORDER — DEXAMETHASONE SODIUM PHOSPHATE 4 MG/ML IJ SOLN
INTRAMUSCULAR | Status: DC | PRN
  Administered 2013-05-11: 10 mg via INTRAVENOUS

## 2013-05-11 MED ORDER — LACTATED RINGERS IV SOLN
INTRAVENOUS | Status: DC
  Administered 2013-05-11 (×2): via INTRAVENOUS

## 2013-05-11 MED ORDER — OXYCODONE HCL 5 MG/5ML PO SOLN: 5.0000 mg | Freq: Once | ORAL | Status: AC | PRN

## 2013-05-11 MED ORDER — BUPIVACAINE-EPINEPHRINE PF 0.5-1:200000 % IJ SOLN
INTRAMUSCULAR | Status: AC
  Filled 2013-05-11: qty 30

## 2013-05-11 MED ORDER — OXYCODONE HCL 5 MG PO TABS
5.0000 mg | ORAL_TABLET | Freq: Once | ORAL | Status: AC | PRN
  Administered 2013-05-11: 5 mg via ORAL

## 2013-05-11 MED ORDER — LIDOCAINE HCL (CARDIAC) 20 MG/ML IV SOLN
INTRAVENOUS | Status: DC | PRN
  Administered 2013-05-11: 100 mg via INTRAVENOUS

## 2013-05-11 MED ORDER — HYDROMORPHONE HCL PF 1 MG/ML IJ SOLN: 0.2500 mg | INTRAMUSCULAR | Status: DC | PRN

## 2013-05-11 SURGICAL SUPPLY — 64 items
ADH SKN CLS APL DERMABOND .7 (GAUZE/BANDAGES/DRESSINGS)
APL SKNCLS STERI-STRIP NONHPOA (GAUZE/BANDAGES/DRESSINGS)
APPLIER CLIP 9.375 MED OPEN (MISCELLANEOUS) ×3
APR CLP MED 9.3 20 MLT OPN (MISCELLANEOUS) ×1
BANDAGE ELASTIC 6 VELCRO ST LF (GAUZE/BANDAGES/DRESSINGS) IMPLANT
BENZOIN TINCTURE PRP APPL 2/3 (GAUZE/BANDAGES/DRESSINGS) IMPLANT
BINDER BREAST LRG (GAUZE/BANDAGES/DRESSINGS) IMPLANT
BINDER BREAST MEDIUM (GAUZE/BANDAGES/DRESSINGS) IMPLANT
BINDER BREAST XLRG (GAUZE/BANDAGES/DRESSINGS) ×2 IMPLANT
BINDER BREAST XXLRG (GAUZE/BANDAGES/DRESSINGS) IMPLANT
BLADE HEX COATED 2.75 (ELECTRODE) ×5 IMPLANT
BLADE SURG 15 STRL LF DISP TIS (BLADE) ×2 IMPLANT
BLADE SURG 15 STRL SS (BLADE) ×6
CANISTER SUCT 1200ML W/VALVE (MISCELLANEOUS) ×3 IMPLANT
CHLORAPREP W/TINT 26ML (MISCELLANEOUS) ×3 IMPLANT
CLIP APPLIE 9.375 MED OPEN (MISCELLANEOUS) IMPLANT
CLOSURE WOUND 1/2 X4 (GAUZE/BANDAGES/DRESSINGS)
COVER MAYO STAND STRL (DRAPES) ×3 IMPLANT
COVER TABLE BACK 60X90 (DRAPES) ×3 IMPLANT
DECANTER SPIKE VIAL GLASS SM (MISCELLANEOUS) IMPLANT
DERMABOND ADVANCED (GAUZE/BANDAGES/DRESSINGS)
DERMABOND ADVANCED .7 DNX12 (GAUZE/BANDAGES/DRESSINGS) IMPLANT
DEVICE DUBIN W/COMP PLATE 8390 (MISCELLANEOUS) ×2 IMPLANT
DRAPE LAPAROSCOPIC ABDOMINAL (DRAPES) ×2 IMPLANT
DRAPE LAPAROTOMY TRNSV 102X78 (DRAPE) IMPLANT
DRAPE PED LAPAROTOMY (DRAPES) ×3 IMPLANT
DRAPE UTILITY XL STRL (DRAPES) ×3 IMPLANT
ELECT REM PT RETURN 9FT ADLT (ELECTROSURGICAL) ×3
ELECTRODE REM PT RTRN 9FT ADLT (ELECTROSURGICAL) ×1 IMPLANT
GAUZE SPONGE 4X4 16PLY XRAY LF (GAUZE/BANDAGES/DRESSINGS) IMPLANT
GLOVE ECLIPSE 6.5 STRL STRAW (GLOVE) ×2 IMPLANT
GLOVE EUDERMIC 7 POWDERFREE (GLOVE) ×3 IMPLANT
GLOVE EXAM NITRILE MD LF STRL (GLOVE) ×2 IMPLANT
GOWN STRL REUS W/ TWL LRG LVL3 (GOWN DISPOSABLE) ×1 IMPLANT
GOWN STRL REUS W/ TWL XL LVL3 (GOWN DISPOSABLE) ×1 IMPLANT
GOWN STRL REUS W/TWL LRG LVL3 (GOWN DISPOSABLE) ×3
GOWN STRL REUS W/TWL XL LVL3 (GOWN DISPOSABLE) ×3
KIT MARKER MARGIN INK (KITS) IMPLANT
NDL HYPO 25X1 1.5 SAFETY (NEEDLE) ×1 IMPLANT
NEEDLE HYPO 22GX1.5 SAFETY (NEEDLE) IMPLANT
NEEDLE HYPO 25X1 1.5 SAFETY (NEEDLE) ×3 IMPLANT
NS IRRIG 1000ML POUR BTL (IV SOLUTION) ×3 IMPLANT
PACK BASIN DAY SURGERY FS (CUSTOM PROCEDURE TRAY) ×3 IMPLANT
PAD ABD 8X10 STRL (GAUZE/BANDAGES/DRESSINGS) ×3 IMPLANT
PENCIL BUTTON HOLSTER BLD 10FT (ELECTRODE) ×3 IMPLANT
SLEEVE SCD COMPRESS KNEE MED (MISCELLANEOUS) ×2 IMPLANT
SPONGE GAUZE 4X4 12PLY STER LF (GAUZE/BANDAGES/DRESSINGS) IMPLANT
SPONGE LAP 4X18 X RAY DECT (DISPOSABLE) ×3 IMPLANT
STRIP CLOSURE SKIN 1/2X4 (GAUZE/BANDAGES/DRESSINGS) IMPLANT
SUT ETHILON 4 0 PS 2 18 (SUTURE) IMPLANT
SUT MNCRL AB 4-0 PS2 18 (SUTURE) ×3 IMPLANT
SUT SILK 2 0 SH (SUTURE) ×3 IMPLANT
SUT VIC AB 2-0 SH 27 (SUTURE)
SUT VIC AB 2-0 SH 27XBRD (SUTURE) IMPLANT
SUT VIC AB 4-0 P-3 18XBRD (SUTURE) IMPLANT
SUT VIC AB 4-0 P3 18 (SUTURE)
SUT VICRYL 3-0 CR8 SH (SUTURE) ×3 IMPLANT
SYR BULB 3OZ (MISCELLANEOUS) IMPLANT
SYR CONTROL 10ML LL (SYRINGE) ×3 IMPLANT
TAPE HYPAFIX 4 X10 (GAUZE/BANDAGES/DRESSINGS) IMPLANT
TOWEL OR NON WOVEN STRL DISP B (DISPOSABLE) ×3 IMPLANT
TUBE CONNECTING 20'X1/4 (TUBING) ×1
TUBE CONNECTING 20X1/4 (TUBING) ×2 IMPLANT
YANKAUER SUCT BULB TIP NO VENT (SUCTIONS) ×3 IMPLANT

## 2013-05-11 NOTE — Anesthesia Preprocedure Evaluation (Signed)
Anesthesia Evaluation  Patient identified by MRN, date of birth, ID band Patient awake    Reviewed: Allergy & Precautions, H&P , NPO status , Patient's Chart, lab work & pertinent test results, reviewed documented beta blocker date and time   Airway Mallampati: II TM Distance: >3 FB Neck ROM: full    Dental   Pulmonary neg pulmonary ROS,  breath sounds clear to auscultation        Cardiovascular hypertension, On Medications Rhythm:regular     Neuro/Psych negative neurological ROS  negative psych ROS   GI/Hepatic Neg liver ROS, GERD-  Medicated and Controlled,  Endo/Other  negative endocrine ROS  Renal/GU negative Renal ROS  negative genitourinary   Musculoskeletal   Abdominal   Peds  Hematology  (+) anemia ,   Anesthesia Other Findings See surgeon's H&P   Reproductive/Obstetrics negative OB ROS                           Anesthesia Physical Anesthesia Plan  ASA: II  Anesthesia Plan: General   Post-op Pain Management:    Induction: Intravenous  Airway Management Planned: LMA  Additional Equipment:   Intra-op Plan:   Post-operative Plan:   Informed Consent: I have reviewed the patients History and Physical, chart, labs and discussed the procedure including the risks, benefits and alternatives for the proposed anesthesia with the patient or authorized representative who has indicated his/her understanding and acceptance.   Dental Advisory Given  Plan Discussed with: CRNA and Surgeon  Anesthesia Plan Comments:         Anesthesia Quick Evaluation

## 2013-05-11 NOTE — Op Note (Signed)
Patient Name:           [name]   Date of Surgery:        05/11/2013  Pre op Diagnosis:      Grade 1 invasive mammary carcinoma left breast, upper inner quadrant, 2 cm by MRI. ER-positive, PR positive, HER-2/neu negative.    Post op Diagnosis:    Same  Procedure:                 Left partial mastectomy with needle localization and margin assessment he  Surgeon:                     Edsel Petrin. Dalbert Batman, M.D., FACS  Assistant:                      None  Operative Indications:     [name] is a 80 y.o. female. She was referred by Dr. Christene Slates at [facility] mammography for evaluation and management of a newly diagnosed invasive cancer of the left breast, upper inner quadrant. Dr. Adella Hare is her PCP. She was recently evaluated in the [facility] by Dr. Sondra Come, Dr. Jana Hakim, and me.  She has been legally blind since birth. She gets yearly mammograms. Recent mammograms show a approximate 2 cm mass in the left breast in the upper inner quadrant. This was not visible on ultrasound so a stereotactic biopsy was performed revealing a grade 1 invasive mammary carcinoma, possibly lobular. ER-positive 99%, PR-positive 100%, HER-2-negative.  Subsequent MRI shows a biopsy-proven cancer in the left breast, 2 cm, upper inner quadrant, solitary finding. The MRI also showed a 1.2 cm suspicious, spiculated mass in the right breast at the 12:00 position. Subsequent biopsy of that area reveals benign fibrocystic change, usual ductal hyperplasia, microcalcifications, and fibroadenoma.There were 2 other areas in his of the right breast probably intramammary lymph nodes.  She is interested in breast conservation..  Clinical management has been discussed in the multidisciplinary clinic, and  Dr. Jana Hakim and I do not feel that she will require sentinel node biopsy since she is not a candidate for chemotherapy.    Operative Findings:       Dr. Barbette Hair placed the localizing wire from a lateral approach  directly through the mammographic density. The specimen mammogram showed the mammographic density to be in the center of the specimen and it felt like we had good margins.  Procedure in Detail:          Following wire localization the patient was brought to the operating room and underwent general anesthesia with an LMA device. The left breast was prepped and draped in a sterile fashion. Intravenous antibiotics were given. Surgical time out was performed. 0.5% Marcaine with epinephrine was used as local infiltration anesthetic.      A curvilinear, somewhat circumareolar incision was made in the upper inner quadrant of the left breast, midway between the insertion site of the wire and the areolar margin. Dissection was carried down into the breast tissue and dissected widely around the localizing wire. The specimen was removed. Silk sutures and a 6 color ink kit was used to orient the pathologist. The specimen mammogram looked good as described above. Specimen was marked and sent to the lab. Hemostasis was excellent and achieved with electrocautery. The lumpectomy cavity was marked with metal clips. The breast tissues were closed in several layers with interrupted sutures of 3-0 Vicryl and the skin closed with a running subcuticular suture  of 4-0 Monocryl and Dermabond. Breast binder was placed. The patient was taken to PACU in stable condition. EBL 10 cc. Counts correct. Complications none.     Edsel Petrin. Dalbert Batman, M.D., FACS General and Minimally Invasive Surgery Breast and Colorectal Surgery  05/11/2013 11:35 AM

## 2013-05-11 NOTE — Interval H&P Note (Signed)
History and Physical Interval Note:  05/11/2013 10:33 AM  [name]  has presented today for surgery, with the diagnosis of LEFT BREAST CANCER   The goals and the various methods of treatment have been discussed with the patient and family. After consideration of risks, benefits and other options for treatment, the patient has consented to  Procedure(s): PARTIAL MASTECTOMY WITH NEEDLE LOCALIZATION (Left) as a surgical intervention .  The patient's history has been reviewed, patient examined today, no change in status, stable for surgery.  I have reviewed the patient's chart and labs.  Questions were answered to the patient's satisfaction.     [name]

## 2013-05-11 NOTE — Discharge Instructions (Signed)
[facility] °Office Phone Number [phone] ° °BREAST BIOPSY/ PARTIAL MASTECTOMY: POST OP INSTRUCTIONS ° °Always review your discharge instruction sheet given to you by the facility where your surgery was performed. ° °IF YOU HAVE DISABILITY OR FAMILY LEAVE FORMS, YOU MUST BRING THEM TO THE OFFICE FOR PROCESSING.  DO NOT GIVE THEM TO YOUR DOCTOR. ° °1. A prescription for pain medication may be given to you upon discharge.  Take your pain medication as prescribed, if needed.  If narcotic pain medicine is not needed, then you may take acetaminophen (Tylenol) or ibuprofen (Advil) as needed. °2. Take your usually prescribed medications unless otherwise directed °3. If you need a refill on your pain medication, please contact your pharmacy.  They will contact our office to request authorization.  Prescriptions will not be filled after 5pm or on week-ends. °4. You should eat very light the first 24 hours after surgery, such as soup, crackers, pudding, etc.  Resume your normal diet the day after surgery. °5. Most patients will experience some swelling and bruising in the breast.  Ice packs and a good support bra will help.  Swelling and bruising can take several days to resolve.  °6. It is common to experience some constipation if taking pain medication after surgery.  Increasing fluid intake and taking a stool softener will usually help or prevent this problem from occurring.  A mild laxative (Milk of Magnesia or Miralax) should be taken according to package directions if there are no bowel movements after 48 hours. °7. Unless discharge instructions indicate otherwise, you may remove your bandages 24-48 hours after surgery, and you may shower at that time.  You may have steri-strips (small skin tapes) in place directly over the incision.  These strips should be left on the skin for 7-10 days.  If your surgeon used skin glue on the incision, you may shower in 24 hours.  The glue will flake off over the  next 2-3 weeks.  Any sutures or staples will be removed at the office during your follow-up visit. °8. ACTIVITIES:  You may resume regular daily activities (gradually increasing) beginning the next day.  Wearing a good support bra or sports bra minimizes pain and swelling.  You may have sexual intercourse when it is comfortable. °a. You may drive when you no longer are taking prescription pain medication, you can comfortably wear a seatbelt, and you can safely maneuver your car and apply brakes. °b. RETURN TO WORK:  ______________________________________________________________________________________ °9. You should see your doctor in the office for a follow-up appointment approximately two weeks after your surgery.  Your doctor’s nurse will typically make your follow-up appointment when she calls you with your pathology report.  Expect your pathology report 2-3 business days after your surgery.  You may call to check if you do not hear from us after three days. °10. OTHER INSTRUCTIONS: _______________________________________________________________________________________________ _____________________________________________________________________________________________________________________________________ °_____________________________________________________________________________________________________________________________________ °_____________________________________________________________________________________________________________________________________ ° °WHEN TO CALL YOUR DOCTOR: °1. Fever over 101.0 °2. Nausea and/or vomiting. °3. Extreme swelling or bruising. °4. Continued bleeding from incision. °5. Increased pain, redness, or drainage from the incision. ° °The clinic staff is available to answer your questions during regular business hours.  Please don’t hesitate to call and ask to speak to one of the nurses for clinical concerns.  If you have a medical emergency, go to the nearest  emergency room or call 911.  A surgeon from [facility] is always on call at the hospital. ° °For further questions, please visit [email or web address]  ° ° °  Post Anesthesia Home Care Instructions ° °Activity: °Get plenty of rest for the remainder of the day. A responsible adult should stay with you for 24 hours following the procedure.  °For the next 24 hours, DO NOT: °-Drive a car °-Operate machinery °-Drink alcoholic beverages °-Take any medication unless instructed by your physician °-Make any legal decisions or sign important papers. ° °Meals: °Start with liquid foods such as gelatin or soup. Progress to regular foods as tolerated. Avoid greasy, spicy, heavy foods. If nausea and/or vomiting occur, drink only clear liquids until the nausea and/or vomiting subsides. Call your physician if vomiting continues. ° °Special Instructions/Symptoms: °Your throat may feel dry or sore from the anesthesia or the breathing tube placed in your throat during surgery. If this causes discomfort, gargle with warm salt water. The discomfort should disappear within 24 hours. ° °

## 2013-05-11 NOTE — Anesthesia Procedure Notes (Signed)
Procedure Name: LMA Insertion Date/Time: 05/11/2013 10:51 AM Performed by: Lieutenant Diego Pre-anesthesia Checklist: Patient identified, Emergency Drugs available, Suction available and Patient being monitored Patient Re-evaluated:Patient Re-evaluated prior to inductionOxygen Delivery Method: Circle System Utilized Preoxygenation: Pre-oxygenation with 100% oxygen Intubation Type: IV induction Ventilation: Mask ventilation without difficulty LMA: LMA inserted LMA Size: 4.0 Number of attempts: 1 Airway Equipment and Method: bite block Placement Confirmation: positive ETCO2 and breath sounds checked- equal and bilateral Tube secured with: Tape Dental Injury: Teeth and Oropharynx as per pre-operative assessment

## 2013-05-11 NOTE — Anesthesia Postprocedure Evaluation (Signed)
  Anesthesia Post-op Note  Patient: [name]  Procedure(s) Performed: Procedure(s): PARTIAL MASTECTOMY WITH NEEDLE LOCALIZATION (Left)  Patient Location: PACU  Anesthesia Type:General  Level of Consciousness: awake and alert   Airway and Oxygen Therapy: Patient Spontanous Breathing  Post-op Pain: none  Post-op Assessment: Post-op Vital signs reviewed, Patient's Cardiovascular Status Stable and Respiratory Function Stable  Post-op Vital Signs: Reviewed  Filed Vitals:   05/11/13 1230  BP: 137/66  Pulse: 69  Temp:   Resp: 19    Complications: No apparent anesthesia complications

## 2013-05-11 NOTE — Transfer of Care (Signed)
Immediate Anesthesia Transfer of Care Note  Patient: [name]  Procedure(s) Performed: Procedure(s): PARTIAL MASTECTOMY WITH NEEDLE LOCALIZATION (Left)  Patient Location: PACU  Anesthesia Type:General  Level of Consciousness: awake and alert   Airway & Oxygen Therapy: Patient Spontanous Breathing and Patient connected to face mask oxygen  Post-op Assessment: Report given to PACU RN and Post -op Vital signs reviewed and stable  Post vital signs: Reviewed and stable  Complications: No apparent anesthesia complications

## 2013-05-12 ENCOUNTER — Encounter (HOSPITAL_BASED_OUTPATIENT_CLINIC_OR_DEPARTMENT_OTHER): Payer: Self-pay | Admitting: General Surgery

## 2013-05-12 NOTE — Progress Notes (Signed)
Quick Note:  Inform patient of Pathology report,. Tell her that the cancer was 1.1 cm in diameter. Tell her that it has been completely ` removed with a negative margin and that she will not need any more surgery. This is good news. I will discuss this with her in detail at her first postop visit.   Edsel Petrin. Dalbert Batman, M.D., [facility] General and Minimally invasive Surgery Breast and Colorectal Surgery Office: [phone] Pager: [phone]  ______

## 2013-05-18 ENCOUNTER — Telehealth (INDEPENDENT_AMBULATORY_CARE_PROVIDER_SITE_OTHER): Payer: Self-pay | Admitting: *Deleted

## 2013-05-18 NOTE — Telephone Encounter (Signed)
Called patient per Dr. Dalbert Batman to make her aware that cancer was 1.1cm diameter.  Informed her that there were negative margins and she will not need any more surgery.  Told her this was good news.  Explained that he will discuss in detail with her at first PO visit.  Patient states understanding at this time and has no questions.

## 2013-05-24 ENCOUNTER — Encounter: Payer: Self-pay | Admitting: Oncology

## 2013-05-24 NOTE — Progress Notes (Deleted)
Location of Breast Cancer: upper inner aspect of the left breast  Histology per Pathology Report:   Breast, left, needle core biopsy - INVASIVE AND IN SITU MAMMARY CARCINOMA WITH CALCIFICATIONS. - SEE COMMENT.  Receptor Status: ER(99%), PR (100%), Her2-neu (negative)  Did patient present with symptoms (if so, please note symptoms) or was this found on screening mammography?: screening mammography  Past/Anticipated interventions by surgeon, if any: 05/11/13 Procedure: PARTIAL MASTECTOMY WITH NEEDLE LOCALIZATION;  Surgeon: [name], MD;  Location: [place];  Service: General;  Laterality: Left;  Past/Anticipated interventions by medical oncology, if any:  Antiestrogen therapy after radiation  Lymphedema issues, if any:  {yes/no:18581} {Right/Left:21944}   Pain issues, if any:  {yes/no:18581} {pain description:21022940}  SAFETY ISSUES:  Prior radiation? no  Pacemaker/ICD? no  Possible current pregnancy?no  Is the patient on methotrexate? no  Current Complaints / other details: Menarche age 48, first live birth age 49, the patient is [place] P5. She went through the change of life at age 89. She did not use hormone replacement.       Jacqulyn Liner, RN 05/24/2013,4:07 PM

## 2013-05-25 ENCOUNTER — Ambulatory Visit
Admission: RE | Admit: 2013-05-25 | Discharge: 2013-05-25 | Disposition: A | Discharge status: Home / Self Care | Payer: Medicare Other | Source: Ambulatory Visit | Attending: Radiation Oncology | Admitting: Radiation Oncology

## 2013-05-25 ENCOUNTER — Encounter: Payer: Self-pay | Admitting: Radiation Oncology

## 2013-05-25 VITALS — BP 125/70 | HR 83 | Temp 98.4°F | Ht 60.0 in | Wt 192.5 lb

## 2013-05-25 DIAGNOSIS — C50312 Malignant neoplasm of lower-inner quadrant of left female breast: Secondary | ICD-10-CM

## 2013-05-25 DIAGNOSIS — C50919 Malignant neoplasm of unspecified site of unspecified female breast: Secondary | ICD-10-CM | POA: Insufficient documentation

## 2013-05-25 DIAGNOSIS — Z79899 Other long term (current) drug therapy: Secondary | ICD-10-CM | POA: Insufficient documentation

## 2013-05-25 NOTE — Progress Notes (Signed)
  Radiation Oncology         [phone] ________________________________  Name: [name] MRN: [id number]  Date: 05/25/2013  DOB: 1934/01/19  Reevaluation note  CC: Adella Hare, MD  [name], MD  Diagnosis:   Stage I invasive ductal carcinoma of the left breast (pT1 C, NX, MX)  Narrative:  The patient returns today for further evaluation. She was seen in the multidisciplinary breast clinic on January 14 along with Dr. Dalbert Batman and Dr. Jana Hakim.  She did undergo biopsy of the right breast which showed no evidence of malignancy. She also proceeded to undergo a partial mastectomy along the left side. Pathologic results from the surgery revealed invasive ductal carcinoma, grade 1 extending over 1.1 cm. Some associated ductal carcinoma in situ with calcifications, low-grade. The surgical margins were clear with the closest margin being 0.5 cm. Patient has been doing well since her surgery. She presents today for discussion of postoperative management.                             ALLERGIES:  has No Known Allergies.  Meds: Current Outpatient Prescriptions  Medication Sig Dispense Refill  . fexofenadine (ALLEGRA) 180 MG tablet Take 1 tablet (180 mg total) by mouth as needed.  30 tablet  11  . hydrochlorothiazide (HYDRODIURIL) 25 MG tablet TAKE 1 TABLET BY MOUTH AS NEEDED  30 tablet  2  . ibuprofen (ADVIL) 200 MG tablet Take 200 mg by mouth as needed.        . pravastatin (PRAVACHOL) 40 MG tablet TAKE 1 TABLET BY MOUTH AT BEDTIME  30 tablet  5  . ranitidine (ZANTAC) 150 MG tablet Take 150 mg by mouth daily as needed.       Marland Kitchen HYDROcodone-acetaminophen (NORCO/VICODIN) 5-325 MG per tablet Take 1-2 tablets by mouth every 4 (four) hours as needed.  30 tablet  0   No current facility-administered medications for this encounter.    Physical Findings: The patient is in no acute distress. Patient is alert and oriented.  height is 5' (1.524 m) and weight is 192 lb 8 oz (87.317 kg). Her  temperature is 98.4 F (36.9 C). Her blood pressure is 125/70 and her pulse is 83. Marland Kitchen  No palpable supraclavicular or axillary adenopathy. The left breast shows a well healing curvilinear scar in the upper inner quadrant.. No signs of infection noted in the breast. Lab Findings: Lab Results  Component Value Date   WBC 5.9 05/09/2013   HGB 11.6* 05/09/2013   HCT 34.7* 05/09/2013   MCV 84.2 05/09/2013   PLT 248 05/09/2013      Radiographic Findings: No results found.  Impression:  Stage I low-grade invasive ductal carcinoma of the left breast. I discussed with patient that her prognosis is good. Given her age and pathologic findings I do not feel that she would require both radiation and adjuvant hormonal therapy as part of her management. I discussed with the patient that she would require one of these therapies. I discussed the course of treatment with radiation therapy in detail and potential long-term effects. At this time the patient is most interested in adjuvant hormonal therapy and not radiation therapy.  Plan:  Medical oncology re-evaluation in April for discussion of adjuvant hormonal therapy.  ____________________________________ Blair Promise, MD

## 2013-05-25 NOTE — Progress Notes (Signed)
Please see the Nurse Progress Note in the MD Initial Consult Encounter for this patient. 

## 2013-05-25 NOTE — Progress Notes (Signed)
Location of Breast Cancer: upper inner aspect of the left breast   Histology per Pathology Report:   Breast, left, needle core biopsy  - INVASIVE AND IN SITU MAMMARY CARCINOMA WITH CALCIFICATIONS.  - SEE COMMENT.   Receptor Status: ER(99%), PR (100%), Her2-neu (negative)   Did patient present with symptoms (if so, please note symptoms) or was this found on screening mammography?: screening mammography   Past/Anticipated interventions by surgeon, if any: 05/11/13 Procedure: PARTIAL MASTECTOMY WITH NEEDLE LOCALIZATION; Surgeon: [name], MD; Location: [place]; Service: General; Laterality: Left;   Past/Anticipated interventions by medical oncology, if any: Antiestrogen therapy after radiation   Lymphedema issues, if any: no Pain issues, if any: no  SAFETY ISSUES:  Prior radiation? no  Pacemaker/ICD? no  Possible current pregnancy?no  Is the patient on methotrexate? No  Current Complaints / other details: Menarche age 24, first live birth age 85, the patient is [name]. She went through the change of life at age 71. She did not use hormone replacement.  Patient is blind.  She is here with her daughter.

## 2013-05-26 ENCOUNTER — Ambulatory Visit (INDEPENDENT_AMBULATORY_CARE_PROVIDER_SITE_OTHER): Payer: No Typology Code available for payment source | Admitting: General Surgery

## 2013-05-26 ENCOUNTER — Encounter (INDEPENDENT_AMBULATORY_CARE_PROVIDER_SITE_OTHER): Payer: Self-pay | Admitting: General Surgery

## 2013-05-26 VITALS — BP 126/70 | HR 78 | Temp 98.6°F | Ht 60.0 in | Wt 192.0 lb

## 2013-05-26 DIAGNOSIS — C50312 Malignant neoplasm of lower-inner quadrant of left female breast: Secondary | ICD-10-CM

## 2013-05-26 DIAGNOSIS — C50319 Malignant neoplasm of lower-inner quadrant of unspecified female breast: Secondary | ICD-10-CM

## 2013-05-26 NOTE — Progress Notes (Signed)
Patient ID: [name], female   DOB: [birth year], 80 y.o.   MRN: [id number] History: This patient underwent left partial mastectomy with needle localization on 05/11/2013. Pathology report shows a 1.1 cm invasive ductal carcinoma with negative margins. ER-positive 100%, PR-positive 100%, HER-2-negative. Ki 67 6%. Pathologic stage TI C., NX. Stage IA. She has no complaints about wound healing, And she does not have any complaints about her left arm.. Minimal pain. No drainage or fever. She has seen Dr. Sondra Come. She states that they plan to give her antiestrogen therapy but not radiation therapy. She is to see Dr. Jana Hakim in April to discuss this further.I gave her family member a copy of the pathology report  Exam: Patient looks well. No distress. Left breast incision upper inner quadrant healing normally. Minimal cosmetic defect. Good symmetry. Minimal thickening. No infection. No drainage. No hematoma Range of motion left shoulder is 100%  Assessment: Invasive carcinoma left breast, receptor positive, HER-2-negative, stage T1c., NX. Recovery uneventfully following the partial mastectomy  Plan: See Dr. Jana Hakim in April, and presumably she will start antiestrogen therapy at that time Diet and activities discussed. Wound healing issues discussed. See me in August for a six-month postop check Mammograms in one year.   Edsel Petrin. Dalbert Batman, M.D., [facility] General and Minimally invasive Surgery Breast and Colorectal Surgery Office:   [phone] Pager:   [phone]

## 2013-05-26 NOTE — Patient Instructions (Signed)
You are recovering from your left partial mastectomy without any obvious surgical complications.  As we discussed, wash the lumpectomy incision with a washcloth daily and the glue will come off over the next few days.  Keep your appt. with Dr. Jana Hakim in April.  Dr. Dalbert Batman agrees that you should be on antiestrogen pills for 5 years.  Return to see Dr. Dalbert Batman in August of this year.  We will plan to get bilateral mammograms in one year.

## 2013-06-28 ENCOUNTER — Telehealth: Payer: Self-pay | Admitting: Oncology

## 2013-06-28 ENCOUNTER — Ambulatory Visit (HOSPITAL_BASED_OUTPATIENT_CLINIC_OR_DEPARTMENT_OTHER): Payer: Medicare Other | Admitting: Oncology

## 2013-06-28 VITALS — BP 129/80 | HR 81 | Temp 98.4°F | Resp 20 | Ht 60.0 in | Wt 194.1 lb

## 2013-06-28 DIAGNOSIS — Z170 Estrogen receptor positive status [ER+]: Secondary | ICD-10-CM

## 2013-06-28 DIAGNOSIS — C50319 Malignant neoplasm of lower-inner quadrant of unspecified female breast: Secondary | ICD-10-CM

## 2013-06-28 DIAGNOSIS — M899 Disorder of bone, unspecified: Secondary | ICD-10-CM

## 2013-06-28 DIAGNOSIS — H548 Legal blindness, as defined in USA: Secondary | ICD-10-CM

## 2013-06-28 DIAGNOSIS — M949 Disorder of cartilage, unspecified: Secondary | ICD-10-CM

## 2013-06-28 DIAGNOSIS — K219 Gastro-esophageal reflux disease without esophagitis: Secondary | ICD-10-CM

## 2013-06-28 DIAGNOSIS — C50312 Malignant neoplasm of lower-inner quadrant of left female breast: Secondary | ICD-10-CM

## 2013-06-28 DIAGNOSIS — E559 Vitamin D deficiency, unspecified: Secondary | ICD-10-CM

## 2013-06-28 NOTE — Telephone Encounter (Signed)
, °

## 2013-06-28 NOTE — Progress Notes (Signed)
[place]  [phone] [phone]) [phone]     ID: [name] OB: 1933/10/13  MR#: [id number]  IDP#:[id number]  PCP: Adella Hare, MD GYN:  Newton Pigg SU: Fanny Skates OTHER MD: Gery Pray  CHIEF COMPLAINT: "I have breast cancer."  HISTORY OF PRESENT ILLNESS: [name] had routine screening mammography at [facility] 03/19/2012 showing a potential abnormality in the left breast. Additional views 03/26/2012 showed no discrete mass or architectural distortion. Left breast ultrasonography was negative.  Short interval followup was suggested and done 09/30/2012 the patient had left diagnostic mammography which showed no concerning change.  Bilateral diagnostic mammography 03/22/2013 showed the patient's breast density to be category B. There was an irregular focal asymmetry in the left breast at 8:00, however ultrasound of this area showed no corresponding abnormality. Accordingly a stereotactic biopsy of the area in question in the left breast was performed 03/28/2013, and showed ([id number]) an invasive ductal carcinoma, with lobular features, grade 1, estrogen receptor 99% positive, progesterone receptor 100% positive, both with strong staining intensity, with an MIB-1 of 6%, and no HER-2 amplification, the signals ratio being 1.35 and the copy number per cell 1.75.  MRI of the breast was obtained 04/05/2013. This confirmed a 2 cm irregular spiculated mass in the upper inner quadrant of the left breast with no additional areas of concern in that breast. In the right breast however at the 12:00 position there was a 1.3 cm irregular spiculated mass. There were 2 oval enhancing masses in the right breast felt consistent with intramammary lymph nodes. There were no abnormal appearing lymph nodes otherwise.  The patient's subsequent history is as detailed below  INTERVAL HISTORY: [name] returns today for followup of her breast cancer accompanied by her daughter  Peter Congo.. Since her last visit here she had a suspicious right breast mass biopsied, 04/12/2013, and this showed only fibrocystic changes with fibroadenoma formation ([id number]). She then went on to left lumpectomy with no sentinel lymph node sampling 05/11/2013, the final pathology there ([id number]) showing a 1.1 cm invasive ductal carcinoma, grade 1, with repeat HER-2 again negative. Margins were ample.  REVIEW OF SYSTEMS:  [name] tolerated her surgery without any unusual side effects, and in particular with no unusual pain, bleeding, or fever. At home she "does a lot of walking after her husband". She goes out to shop, and also of course to church as well as to doctor's appointments.   PAST MEDICAL HISTORY: Past Medical History  Diagnosis Date  . VITAMIN D DEFICIENCY 01/25/2009  . HYPERLIPIDEMIA 12/14/2006  . BLINDNESS, [place], [place] DEFINITION 12/14/2006  . EXTERNAL HEMORRHOIDS 09/30/2007  . GERD 10/01/2007  . CONSTIPATION 12/14/2006  . RECTAL BLEEDING 10/01/2007  . OSTEOPENIA 02/07/2008  . OOPHORECTOMY, RIGHT, HX OF 12/14/2006  . ROTATOR CUFF REPAIR, RIGHT, HX OF 12/14/2006  . Anemia   . Arthritis   . Cancer     breast  . Edema   . Blind     PAST SURGICAL HISTORY: Past Surgical History  Procedure Laterality Date  . Rotator cuff repair      right  . Right oophorectomy    . Irrigation and debridement sebaceous cyst  06/07    [name]  . Tubal ligation    . Le art doppler   '98    normal  . Nuc stress test   07/07    normal study  . Abdominal hysterectomy    . Partial mastectomy with needle localization Left 05/11/2013    Procedure: PARTIAL  MASTECTOMY WITH NEEDLE LOCALIZATION;  Surgeon: [name], MD;  Location: [facility];  Service: General;  Laterality: Left;    FAMILY HISTORY Family History  Problem Relation Age of Onset  . Hypertension Father   . Dementia Father   . Cancer Neg Hx     Negative for breast or colon cancer  . Heart disease Neg Hx    the  patient's father was murdered at the age of 60. The patient's mother died in her sleep at the age of 40. The patient has 3 brothers, 6 sisters. She has one cousin with breast cancer, but does not know at what age she was diagnosed. She also has a cousin with ovarian cancer, but again does not know at what age it was diagnosed.  GYNECOLOGIC HISTORY:  Menarche age 65, first live birth age 73, the patient is [place] P5. She went through the change of life at age 30. She did not use hormone replacement.   SOCIAL HISTORY:  [name] used to work at [employer], but is now retired. Her husband "Buddie" is also blind. He suffers from significant Alzheimer's disease. The patient lives with her husband and 2 grandchildren, Denny Peon, 72, and 75, 23. The patient's children are Sunday Spillers, who works in [place] in Programmer, applications for nursing agency; Trilby Drummer who works in [place] as a Curator; Surveyor, mining, who works in [place] in Psychologist, educational; Alfonzo, who also lives in [place]; and [place], who is a Education officer, environmental. The patient has 6 grandchildren, four great-grandchildren. She attends a local [place]: Not in place; the patient intends to name her daughter Peter Congo as healthcare power of attorney. Peter Congo can be reached at [phone]   HEALTH MAINTENANCE: History  Substance Use Topics  . Smoking status: Never Smoker   . Smokeless tobacco: Not on file  . Alcohol Use: No     Colonoscopy:  PAP:  Bone density: 04/07/2013 at [facility] showed osteopenia with a T score of -2.2 at the left femoral neck  Lipid panel:  No Known Allergies  Current Outpatient Prescriptions  Medication Sig Dispense Refill  . fexofenadine (ALLEGRA) 180 MG tablet Take 1 tablet (180 mg total) by mouth as needed.  30 tablet  11  . hydrochlorothiazide (HYDRODIURIL) 25 MG tablet TAKE 1 TABLET BY MOUTH AS NEEDED  30 tablet  2  . ibuprofen (ADVIL) 200 MG tablet Take 200 mg by mouth as needed.        .  pravastatin (PRAVACHOL) 40 MG tablet TAKE 1 TABLET BY MOUTH AT BEDTIME  30 tablet  5  . ranitidine (ZANTAC) 150 MG tablet Take 150 mg by mouth daily as needed.        No current facility-administered medications for this visit.    OBJECTIVE: Older African American woman in no acute distress Filed Vitals:   06/28/13 0906  BP: 129/80  Pulse: 81  Temp: 98.4 F (36.9 C)  Resp: 20     Body mass index is 37.91 kg/(m^2).    ECOG FS:0 - Asymptomatic  Patient is legally blind Oropharynx clear and moist No cervical or supraclavicular adenopathy Lungs no rales or rhonchi Heart regular rate and rhythm Abd soft, nontender, positive bowel sounds MSK no focal spinal tenderness, no upper extremity lymphedema Neuro: nonfocal, well oriented, appropriate affect Breasts: The right breast is unremarkable. The left breast is status post recent lumpectomy. There is no erythema, dehiscence, swelling, or unusual tenderness. The left axilla is benign  LAB RESULTS:  CMP     Component Value Date/Time   NA 143 05/09/2013 0920   NA 142 04/06/2013 1226   K 4.4 05/09/2013 0920   K 3.9 04/06/2013 1226   CL 104 05/09/2013 0920   CO2 25 05/09/2013 0920   CO2 23 04/06/2013 1226   GLUCOSE 100* 05/09/2013 0920   GLUCOSE 109 04/06/2013 1226   BUN 18 05/09/2013 0920   BUN 12.9 04/06/2013 1226   CREATININE 0.88 05/09/2013 0920   CREATININE 0.9 04/06/2013 1226   CALCIUM 9.9 05/09/2013 0920   CALCIUM 9.3 04/06/2013 1226   CALCIUM 9.9 12/24/2010 1018   PROT 8.2 05/09/2013 0920   PROT 7.5 04/06/2013 1226   ALBUMIN 3.9 05/09/2013 0920   ALBUMIN 3.8 04/06/2013 1226   AST 23 05/09/2013 0920   AST 19 04/06/2013 1226   ALT 19 05/09/2013 0920   ALT 15 04/06/2013 1226   ALKPHOS 81 05/09/2013 0920   ALKPHOS 75 04/06/2013 1226   BILITOT 0.3 05/09/2013 0920   BILITOT 0.37 04/06/2013 1226   GFRNONAA 62* 05/09/2013 0920   GFRAA 72* 05/09/2013 0920    I No results found for this basename: SPEP,  UPEP,   kappa and lambda light chains     Lab Results  Component Value Date   WBC 5.9 05/09/2013   NEUTROABS 3.1 05/09/2013   HGB 11.6* 05/09/2013   HCT 34.7* 05/09/2013   MCV 84.2 05/09/2013   PLT 248 05/09/2013      Chemistry      Component Value Date/Time   NA 143 05/09/2013 0920   NA 142 04/06/2013 1226   K 4.4 05/09/2013 0920   K 3.9 04/06/2013 1226   CL 104 05/09/2013 0920   CO2 25 05/09/2013 0920   CO2 23 04/06/2013 1226   BUN 18 05/09/2013 0920   BUN 12.9 04/06/2013 1226   CREATININE 0.88 05/09/2013 0920   CREATININE 0.9 04/06/2013 1226      Component Value Date/Time   CALCIUM 9.9 05/09/2013 0920   CALCIUM 9.3 04/06/2013 1226   CALCIUM 9.9 12/24/2010 1018   ALKPHOS 81 05/09/2013 0920   ALKPHOS 75 04/06/2013 1226   AST 23 05/09/2013 0920   AST 19 04/06/2013 1226   ALT 19 05/09/2013 0920   ALT 15 04/06/2013 1226   BILITOT 0.3 05/09/2013 0920   BILITOT 0.37 04/06/2013 1226       No results found for this basename: LABCA2    No components found with this basename: LABCA125    No results found for this basename: INR,  in the last 168 hours  Urinalysis    Component Value Date/Time   COLORURINE YELLOW 05/09/2013 0848   APPEARANCEUR CLEAR 05/09/2013 0848   LABSPEC 1.025 05/09/2013 0848   PHURINE 6.0 05/09/2013 0848   GLUCOSEU NEGATIVE 05/09/2013 0848   HGBUR NEGATIVE 05/09/2013 0848   BILIRUBINUR NEGATIVE 05/09/2013 0848   KETONESUR NEGATIVE 05/09/2013 0848   PROTEINUR NEGATIVE 05/09/2013 0848   UROBILINOGEN 0.2 05/09/2013 0848   NITRITE NEGATIVE 05/09/2013 0848   LEUKOCYTESUR TRACE* 05/09/2013 0848    STUDIES: Bone density obtained at [facility] 15 2015 was reviewed with the patient and her daughter  ASSESSMENT: 37 y.o. [place] woman status post left breast biopsy 03/28/2013 for a clinical T1c N0, stage IA invasive (lobular) carcinoma, grade 1, estrogen and progesterone receptor positive, HER-2 negative, with an MIB-1 of 6%  (1) a 1.3 cm lesion noted by MRI in the right breast was proven benign by biopsy  04/20/2013  (  2) status post left lumpectomy without salpingo-oophorectomy 05/11/2013 for a pT1c cN0, stage IA invasive ductal carcinoma, grade 1, repeat HER-2 again negative, with ample margins  (3) the patient was evaluated by radiation oncology, who felt if she took anti-estrogens it would be safe for her to forego radiation  (4) tamoxifen started April 2015   PLAN: [name] has done well with her local treatment and she is now ready to start systemic therapy. We had previously decided against chemotherapy which in her case would only affect survival of perhaps 1% or more likely even less than that. Accordingly we will start anti-estrogens.  We just obtained a bone density which shows significant osteopenia with a T score of -2.2, close to osteoporosis. Accordingly I would prefer to not use anastrozole. On the other hand she status post hysterectomy which removes one of the concerns regarding tamoxifen.  The plan will be for her to start tamoxifen and to continue that for 5 years. We discussed the possible toxicities, side effects and complications of this Mcguire including the concern of blood clots. We also discussed the possible benefits which include not only prevention of recurrence of this cancer but prevention of a new breast cancer developing in either breast.  [name] has a good understanding of the overall plan. She agrees with it. She does the goal of treatment or cases cure. She will call for any problems that may develop before her next visit here.   Chauncey Cruel, MD   06/28/2013 9:39 AM

## 2013-07-04 ENCOUNTER — Other Ambulatory Visit: Payer: Self-pay | Admitting: *Deleted

## 2013-07-04 MED ORDER — TAMOXIFEN CITRATE 20 MG PO TABS: 20.0000 mg | ORAL_TABLET | Freq: Every day | ORAL | Status: DC

## 2013-07-06 ENCOUNTER — Other Ambulatory Visit (INDEPENDENT_AMBULATORY_CARE_PROVIDER_SITE_OTHER): Payer: Medicare Other

## 2013-07-06 ENCOUNTER — Encounter: Payer: Self-pay | Admitting: Internal Medicine

## 2013-07-06 ENCOUNTER — Ambulatory Visit (INDEPENDENT_AMBULATORY_CARE_PROVIDER_SITE_OTHER)
Admission: RE | Admit: 2013-07-06 | Discharge: 2013-07-06 | Disposition: A | Discharge status: Home / Self Care | Payer: Medicare Other | Source: Ambulatory Visit | Attending: Internal Medicine | Admitting: Internal Medicine

## 2013-07-06 ENCOUNTER — Ambulatory Visit (INDEPENDENT_AMBULATORY_CARE_PROVIDER_SITE_OTHER): Payer: Medicare Other | Admitting: Internal Medicine

## 2013-07-06 VITALS — BP 138/86 | HR 87 | Temp 97.4°F | Resp 16 | Ht 60.0 in | Wt 189.4 lb

## 2013-07-06 DIAGNOSIS — Z0000 Encounter for general adult medical examination without abnormal findings: Secondary | ICD-10-CM

## 2013-07-06 DIAGNOSIS — R7309 Other abnormal glucose: Secondary | ICD-10-CM

## 2013-07-06 DIAGNOSIS — E785 Hyperlipidemia, unspecified: Secondary | ICD-10-CM

## 2013-07-06 DIAGNOSIS — M899 Disorder of bone, unspecified: Secondary | ICD-10-CM

## 2013-07-06 DIAGNOSIS — R109 Unspecified abdominal pain: Secondary | ICD-10-CM

## 2013-07-06 DIAGNOSIS — M949 Disorder of cartilage, unspecified: Secondary | ICD-10-CM

## 2013-07-06 DIAGNOSIS — R011 Cardiac murmur, unspecified: Secondary | ICD-10-CM

## 2013-07-06 DIAGNOSIS — G8929 Other chronic pain: Secondary | ICD-10-CM

## 2013-07-06 DIAGNOSIS — R7303 Prediabetes: Secondary | ICD-10-CM | POA: Insufficient documentation

## 2013-07-06 DIAGNOSIS — E559 Vitamin D deficiency, unspecified: Secondary | ICD-10-CM

## 2013-07-06 DIAGNOSIS — K219 Gastro-esophageal reflux disease without esophagitis: Secondary | ICD-10-CM

## 2013-07-06 LAB — URINALYSIS, ROUTINE W REFLEX MICROSCOPIC
BILIRUBIN URINE: NEGATIVE
Ketones, ur: NEGATIVE
Leukocytes, UA: NEGATIVE
Nitrite: NEGATIVE
Specific Gravity, Urine: 1.02 (ref 1.000–1.030)
Total Protein, Urine: NEGATIVE
URINE GLUCOSE: NEGATIVE
UROBILINOGEN UA: 0.2 (ref 0.0–1.0)
pH: 5.5 (ref 5.0–8.0)

## 2013-07-06 LAB — LIPID PANEL
Cholesterol: 193 mg/dL (ref 0–200)
HDL: 61.4 mg/dL (ref >39.00)
LDL CALC: 113 mg/dL — AB (ref 0–99)
Total CHOL/HDL Ratio: 3
Triglycerides: 95 mg/dL (ref 0.0–149.0)
VLDL: 19 mg/dL (ref 0.0–40.0)

## 2013-07-06 LAB — CBC WITH DIFFERENTIAL/PLATELET
Basophils Absolute: 0 10*3/uL (ref 0.0–0.1)
Basophils Relative: 0.5 % (ref 0.0–3.0)
EOS ABS: 0.1 10*3/uL (ref 0.0–0.7)
EOS PCT: 1.7 % (ref 0.0–5.0)
HCT: 35.7 % — ABNORMAL LOW (ref 36.0–46.0)
Hemoglobin: 11.8 g/dL — ABNORMAL LOW (ref 12.0–15.0)
LYMPHS PCT: 37 % (ref 12.0–46.0)
Lymphs Abs: 2 10*3/uL (ref 0.7–4.0)
MCHC: 32.9 g/dL (ref 30.0–36.0)
MCV: 84.6 fl (ref 78.0–100.0)
Monocytes Absolute: 0.4 10*3/uL (ref 0.1–1.0)
Monocytes Relative: 7.4 % (ref 3.0–12.0)
NEUTROS PCT: 53.4 % (ref 43.0–77.0)
Neutro Abs: 2.9 10*3/uL (ref 1.4–7.7)
PLATELETS: 230 10*3/uL (ref 150.0–400.0)
RBC: 4.22 Mil/uL (ref 3.87–5.11)
RDW: 14.8 % — ABNORMAL HIGH (ref 11.5–14.6)
WBC: 5.4 10*3/uL (ref 4.5–10.5)

## 2013-07-06 LAB — COMPREHENSIVE METABOLIC PANEL
ALT: 15 U/L (ref 0–35)
AST: 23 U/L (ref 0–37)
Albumin: 4.2 g/dL (ref 3.5–5.2)
Alkaline Phosphatase: 75 U/L (ref 39–117)
BUN: 16 mg/dL (ref 6–23)
CALCIUM: 9.9 mg/dL (ref 8.4–10.5)
CHLORIDE: 107 meq/L (ref 96–112)
CO2: 27 mEq/L (ref 19–32)
Creatinine, Ser: 0.8 mg/dL (ref 0.4–1.2)
GFR: 84.35 mL/min (ref >60.00)
GLUCOSE: 94 mg/dL (ref 70–99)
POTASSIUM: 4.1 meq/L (ref 3.5–5.1)
SODIUM: 140 meq/L (ref 135–145)
TOTAL PROTEIN: 7.9 g/dL (ref 6.0–8.3)
Total Bilirubin: 0.7 mg/dL (ref 0.3–1.2)

## 2013-07-06 LAB — TSH: TSH: 1.66 u[IU]/mL (ref 0.35–5.50)

## 2013-07-06 LAB — HEMOGLOBIN A1C: HEMOGLOBIN A1C: 6.2 % (ref 4.6–6.5)

## 2013-07-06 NOTE — Progress Notes (Signed)
Pre visit review using our clinic review tool, if applicable. No additional management support is needed unless otherwise documented below in the visit note. 

## 2013-07-06 NOTE — Assessment & Plan Note (Signed)
I will check her A1C to see if she has developed DM2 

## 2013-07-06 NOTE — Assessment & Plan Note (Addendum)
The patient is here for annual Medicare wellness examination and management of other chronic and acute problems.   The risk factors are reflected in the social history.  The roster of all physicians providing medical care to patient - is listed in the Snapshot section of the chart.  Activities of daily living:  The patient is 100% inedpendent in all ADLs: dressing, toileting, feeding as well as independent mobility  Home safety : The patient has smoke detectors in the home. They wear seatbelts.No firearms at home ( firearms are present in the home, kept in a safe fashion). There is no violence in the home.   There is no risks for hepatitis, STDs or HIV. There is no   history of blood transfusion. They have no travel history to infectious disease endemic areas of the world.  The patient has (has not) seen their dentist in the last six month. They have (not) seen their eye doctor in the last year. They deny (admit to) any hearing difficulty and have not had audiologic testing in the last year.  They do not  have excessive sun exposure. Discussed the need for sun protection: hats, long sleeves and use of sunscreen if there is significant sun exposure.   Diet: the importance of a healthy diet is discussed. They do have a healthy (unhealthy-high fat/fast food) diet.  The benefits of regular aerobic exercise were discussed.  Depression screen: there are no signs or vegative symptoms of depression- irritability, change in appetite, anhedonia, sadness/tearfullness.  Cognitive assessment: the patient manages all their financial and personal affairs and is actively engaged. They could relate day,date,year and events; recalled 3/3 objects at 3 minutes; performed clock-face test normally.  The following portions of the patient's history were reviewed and updated as appropriate: allergies, current medications, past family history, past medical history,  past surgical history, past social history  and  problem list.  Vision, hearing, body mass index were assessed and reviewed.   During the course of the visit the patient was educated and counseled about appropriate screening and preventive services including : fall prevention , diabetes screening, nutrition counseling, colorectal cancer screening, and recommended immunizations.  

## 2013-07-06 NOTE — Patient Instructions (Signed)
Flank Pain Flank pain refers to pain that is located on the side of the body between the upper abdomen and the back. The pain may occur over a short period of time (acute) or may be long-term or reoccurring (chronic). It may be mild or severe. Flank pain can be caused by many things. CAUSES  Some of the more common causes of flank pain include:  Muscle strains.   Muscle spasms.   A disease of your spine (vertebral disk disease).   A lung infection (pneumonia).   Fluid around your lungs (pulmonary edema).   A kidney infection.   Kidney stones.   A very painful skin rash caused by the chickenpox virus (shingles).   Gallbladder disease.  [place] care will depend on the cause of your pain. In general,  Rest as directed by your caregiver.  Drink enough fluids to keep your urine clear or pale yellow.  Only take over-the-counter or prescription medicines as directed by your caregiver. Some medicines may help relieve the pain.  Tell your caregiver about any changes in your pain.  Follow up with your caregiver as directed. SEEK IMMEDIATE MEDICAL CARE IF:   Your pain is not controlled with medicine.   You have new or worsening symptoms.  Your pain increases.   You have abdominal pain.   You have shortness of breath.   You have persistent nausea or vomiting.   You have swelling in your abdomen.   You feel faint or pass out.   You have blood in your urine.  You have a fever or persistent symptoms for more than 2 3 days.  You have a fever and your symptoms suddenly get worse. MAKE SURE YOU:   Understand these instructions.  Will watch your condition.  Will get help right away if you are not doing well or get worse. Document Released: 05/01/2005 Document Revised: 12/03/2011 Document Reviewed: 10/23/2011 [facility] Patient Information 2014 [place]. Preventive Care for Adults, Female A healthy lifestyle and preventive care  can promote health and wellness. Preventive health guidelines for women include the following key practices.  A routine yearly physical is a good way to check with your health care provider about your health and preventive screening. It is a chance to share any concerns and updates on your health and to receive a thorough exam.  Visit your dentist for a routine exam and preventive care every 6 months. Brush your teeth twice a day and floss once a day. Good oral hygiene prevents tooth decay and gum disease.  The frequency of eye exams is based on your age, health, family medical history, use of contact lenses, and other factors. Follow your health care provider's recommendations for frequency of eye exams.  Eat a healthy diet. Foods like vegetables, fruits, whole grains, low-fat dairy products, and lean protein foods contain the nutrients you need without too many calories. Decrease your intake of foods high in solid fats, added sugars, and salt. Eat the right amount of calories for you.Get information about a proper diet from your health care provider, if necessary.  Regular physical exercise is one of the most important things you can do for your health. Most adults should get at least 150 minutes of moderate-intensity exercise (any activity that increases your heart rate and causes you to sweat) each week. In addition, most adults need muscle-strengthening exercises on 2 or more days a week.  Maintain a healthy weight. The body mass index (BMI) is a screening  tool to identify possible weight problems. It provides an estimate of body fat based on height and weight. Your health care provider can find your BMI, and can help you achieve or maintain a healthy weight.For adults 20 years and older:  A BMI below 18.5 is considered underweight.  A BMI of 18.5 to 24.9 is normal.  A BMI of 25 to 29.9 is considered overweight.  A BMI of 30 and above is considered obese.  Maintain normal blood lipids  and cholesterol levels by exercising and minimizing your intake of saturated fat. Eat a balanced diet with plenty of fruit and vegetables. Blood tests for lipids and cholesterol should begin at age 42 and be repeated every 5 years. If your lipid or cholesterol levels are high, you are over 50, or you are at high risk for heart disease, you may need your cholesterol levels checked more frequently.Ongoing high lipid and cholesterol levels should be treated with medicines if diet and exercise are not working.  If you smoke, find out from your health care provider how to quit. If you do not use tobacco, do not start.  Lung cancer screening is recommended for adults aged 62 80 years who are at high risk for developing lung cancer because of a history of smoking. A yearly low-dose CT scan of the lungs is recommended for people who have at least a 30-pack-year history of smoking and are a current smoker or have quit within the past 15 years. A pack year of smoking is smoking an average of 1 pack of cigarettes a day for 1 year (for example: 1 pack a day for 30 years or 2 packs a day for 15 years). Yearly screening should continue until the smoker has stopped smoking for at least 15 years. Yearly screening should be stopped for people who develop a health problem that would prevent them from having lung cancer treatment.  If you are pregnant, do not drink alcohol. If you are breastfeeding, be very cautious about drinking alcohol. If you are not pregnant and choose to drink alcohol, do not have more than 1 drink per day. One drink is considered to be 12 ounces (355 mL) of beer, 5 ounces (148 mL) of wine, or 1.5 ounces (44 mL) of liquor.  Avoid use of street drugs. Do not share needles with anyone. Ask for help if you need support or instructions about stopping the use of drugs.  High blood pressure causes heart disease and increases the risk of stroke. Your blood pressure should be checked at least every 1 to 2  years. Ongoing high blood pressure should be treated with medicines if weight loss and exercise do not work.  If you are 56 78 years old, ask your health care provider if you should take aspirin to prevent strokes.  Diabetes screening involves taking a blood sample to check your fasting blood sugar level. This should be done once every 3 years, after age 24, if you are within normal weight and without risk factors for diabetes. Testing should be considered at a younger age or be carried out more frequently if you are overweight and have at least 1 risk factor for diabetes.  Breast cancer screening is essential preventive care for women. You should practice "breast self-awareness." This means understanding the normal appearance and feel of your breasts and may include breast self-examination. Any changes detected, no matter how small, should be reported to a health care provider. Women in their 29s and 30s should have  a clinical breast exam (CBE) by a health care provider as part of a regular health exam every 1 to 3 years. After age 26, women should have a CBE every year. Starting at age 68, women should consider having a mammogram (breast X-ray test) every year. Women who have a family history of breast cancer should talk to their health care provider about genetic screening. Women at a high risk of breast cancer should talk to their health care providers about having an MRI and a mammogram every year.  Breast cancer gene (BRCA)-related cancer risk assessment is recommended for women who have family members with BRCA-related cancers. BRCA-related cancers include breast, ovarian, tubal, and peritoneal cancers. Having family members with these cancers may be associated with an increased risk for harmful changes (mutations) in the breast cancer genes BRCA1 and BRCA2. Results of the assessment will determine the need for genetic counseling and BRCA1 and BRCA2 testing.  The Pap test is a screening test for  cervical cancer. A Pap test can show cell changes on the cervix that might become cervical cancer if left untreated. A Pap test is a procedure in which cells are obtained and examined from the lower end of the uterus (cervix).  Women should have a Pap test starting at age 35.  Between ages 48 and 41, Pap tests should be repeated every 2 years.  Beginning at age 2, you should have a Pap test every 3 years as long as the past 3 Pap tests have been normal.  Some women have medical problems that increase the chance of getting cervical cancer. Talk to your health care provider about these problems. It is especially important to talk to your health care provider if a new problem develops soon after your last Pap test. In these cases, your health care provider may recommend more frequent screening and Pap tests.  The above recommendations are the same for women who have or have not gotten the vaccine for human papillomavirus (HPV).  If you had a hysterectomy for a problem that was not cancer or a condition that could lead to cancer, then you no longer need Pap tests. Even if you no longer need a Pap test, a regular exam is a good idea to make sure no other problems are starting.  If you are between ages 62 and 33 years, and you have had normal Pap tests going back 10 years, you no longer need Pap tests. Even if you no longer need a Pap test, a regular exam is a good idea to make sure no other problems are starting.  If you have had past treatment for cervical cancer or a condition that could lead to cancer, you need Pap tests and screening for cancer for at least 20 years after your treatment.  If Pap tests have been discontinued, risk factors (such as a new sexual partner) need to be reassessed to determine if screening should be resumed.  The HPV test is an additional test that may be used for cervical cancer screening. The HPV test looks for the virus that can cause the cell changes on the cervix.  The cells collected during the Pap test can be tested for HPV. The HPV test could be used to screen women aged 30 years and older, and should be used in women of any age who have unclear Pap test results. After the age of 51, women should have HPV testing at the same frequency as a Pap test.  Colorectal cancer can  be detected and often prevented. Most routine colorectal cancer screening begins at the age of 54 years and continues through age 24 years. However, your health care provider may recommend screening at an earlier age if you have risk factors for colon cancer. On a yearly basis, your health care provider may provide home test kits to check for hidden blood in the stool. Use of a small camera at the end of a tube, to directly examine the colon (sigmoidoscopy or colonoscopy), can detect the earliest forms of colorectal cancer. Talk to your health care provider about this at age 48, when routine screening begins. Direct exam of the colon should be repeated every 5 10 years through age 64 years, unless early forms of pre-cancerous polyps or small growths are found.  People who are at an increased risk for hepatitis B should be screened for this virus. You are considered at high risk for hepatitis B if:  You were born in a country where hepatitis B occurs often. Talk with your health care provider about which countries are considered high risk.  Your parents were born in a high-risk country and you have not received a shot to protect against hepatitis B (hepatitis B vaccine).  You have HIV or AIDS.  You use needles to inject street drugs.  You live with, or have sex with, someone who has Hepatitis B.  You get hemodialysis treatment.  You take certain medicines for conditions like cancer, organ transplantation, and autoimmune conditions.  Hepatitis C blood testing is recommended for all people born from [birth year] through 1965 and any individual with known risks for hepatitis C.  Practice safe  sex. Use condoms and avoid high-risk sexual practices to reduce the spread of sexually transmitted infections (STIs). STIs include gonorrhea, chlamydia, syphilis, trichomonas, herpes, HPV, and human immunodeficiency virus (HIV). Herpes, HIV, and HPV are viral illnesses that have no cure. They can result in disability, cancer, and death. Sexually active women aged 67 years and younger should be checked for chlamydia. Older women with new or multiple partners should also be tested for chlamydia. Testing for other STIs is recommended if you are sexually active and at increased risk.  Osteoporosis is a disease in which the bones lose minerals and strength with aging. This can result in serious bone fractures or breaks. The risk of osteoporosis can be identified using a bone density scan. Women ages 51 years and over and women at risk for fractures or osteoporosis should discuss screening with their health care providers. Ask your health care provider whether you should take a calcium supplement or vitamin D to reduce the rate of osteoporosis.  Menopause can be associated with physical symptoms and risks. Hormone replacement therapy is available to decrease symptoms and risks. You should talk to your health care provider about whether hormone replacement therapy is right for you.  Use sunscreen. Apply sunscreen liberally and repeatedly throughout the day. You should seek shade when your shadow is shorter than you. Protect yourself by wearing long sleeves, pants, a wide-brimmed hat, and sunglasses year round, whenever you are outdoors.  Once a month, do a whole body skin exam, using a mirror to look at the skin on your back. Tell your health care provider of new moles, moles that have irregular borders, moles that are larger than a pencil eraser, or moles that have changed in shape or color.  Stay current with required vaccines (immunizations).  Influenza vaccine. All adults should be immunized every  year.  Tetanus,  diphtheria, and acellular pertussis (Td, Tdap) vaccine. Pregnant women should receive 1 dose of Tdap vaccine during each pregnancy. The dose should be obtained regardless of the length of time since the last dose. Immunization is preferred during the 27th 36th week of gestation. An adult who has not previously received Tdap or who does not know her vaccine status should receive 1 dose of Tdap. This initial dose should be followed by tetanus and diphtheria toxoids (Td) booster doses every 10 years. Adults with an unknown or incomplete history of completing a 3-dose immunization series with Td-containing vaccines should begin or complete a primary immunization series including a Tdap dose. Adults should receive a Td booster every 10 years.  Varicella vaccine. An adult without evidence of immunity to varicella should receive 2 doses or a second dose if she has previously received 1 dose. Pregnant females who do not have evidence of immunity should receive the first dose after pregnancy. This first dose should be obtained before leaving the health care facility. The second dose should be obtained 4 8 weeks after the first dose.  Human papillomavirus (HPV) vaccine. Females aged 82 26 years who have not received the vaccine previously should obtain the 3-dose series. The vaccine is not recommended for use in pregnant females. However, pregnancy testing is not needed before receiving a dose. If a female is found to be pregnant after receiving a dose, no treatment is needed. In that case, the remaining doses should be delayed until after the pregnancy. Immunization is recommended for any person with an immunocompromised condition through the age of 49 years if she did not get any or all doses earlier. During the 3-dose series, the second dose should be obtained 4 8 weeks after the first dose. The third dose should be obtained 24 weeks after the first dose and 16 weeks after the second dose.  Zoster  vaccine. One dose is recommended for adults aged 32 years or older unless certain conditions are present.  Measles, mumps, and rubella (MMR) vaccine. Adults born before [birth year] generally are considered immune to measles and mumps. Adults born in [birth year] or later should have 1 or more doses of MMR vaccine unless there is a contraindication to the vaccine or there is laboratory evidence of immunity to each of the three diseases. A routine second dose of MMR vaccine should be obtained at least 28 days after the first dose for students attending postsecondary schools, health care workers, or international travelers. People who received inactivated measles vaccine or an unknown type of measles vaccine during 1963 1967 should receive 2 doses of MMR vaccine. People who received inactivated mumps vaccine or an unknown type of mumps vaccine before 1979 and are at high risk for mumps infection should consider immunization with 2 doses of MMR vaccine. For females of childbearing age, rubella immunity should be determined. If there is no evidence of immunity, females who are not pregnant should be vaccinated. If there is no evidence of immunity, females who are pregnant should delay immunization until after pregnancy. Unvaccinated health care workers born before [birth year] who lack laboratory evidence of measles, mumps, or rubella immunity or laboratory confirmation of disease should consider measles and mumps immunization with 2 doses of MMR vaccine or rubella immunization with 1 dose of MMR vaccine.  Pneumococcal 13-valent conjugate (PCV13) vaccine. When indicated, a person who is uncertain of her immunization history and has no record of immunization should receive the PCV13 vaccine. An adult aged 53 years or older who  has certain medical conditions and has not been previously immunized should receive 1 dose of PCV13 vaccine. This PCV13 should be followed with a dose of pneumococcal polysaccharide (PPSV23) vaccine. The PPSV23  vaccine dose should be obtained at least 8 weeks after the dose of PCV13 vaccine. An adult aged 54 years or older who has certain medical conditions and previously received 1 or more doses of PPSV23 vaccine should receive 1 dose of PCV13. The PCV13 vaccine dose should be obtained 1 or more years after the last PPSV23 vaccine dose.  Pneumococcal polysaccharide (PPSV23) vaccine. When PCV13 is also indicated, PCV13 should be obtained first. All adults aged 37 years and older should be immunized. An adult younger than age 32 years who has certain medical conditions should be immunized. Any person who resides in a nursing home or long-term care facility should be immunized. An adult smoker should be immunized. People with an immunocompromised condition and certain other conditions should receive both PCV13 and PPSV23 vaccines. People with human immunodeficiency virus (HIV) infection should be immunized as soon as possible after diagnosis. Immunization during chemotherapy or radiation therapy should be avoided. Routine use of PPSV23 vaccine is not recommended for American Indians, [place] Natives, or people younger than 65 years unless there are medical conditions that require PPSV23 vaccine. When indicated, people who have unknown immunization and have no record of immunization should receive PPSV23 vaccine. One-time revaccination 5 years after the first dose of PPSV23 is recommended for people aged 6 64 years who have chronic kidney failure, nephrotic syndrome, asplenia, or immunocompromised conditions. People who received 1 2 doses of PPSV23 before age 56 years should receive another dose of PPSV23 vaccine at age 63 years or later if at least 5 years have passed since the previous dose. Doses of PPSV23 are not needed for people immunized with PPSV23 at or after age 71 years.  Meningococcal vaccine. Adults with asplenia or persistent complement component deficiencies should receive 2 doses of quadrivalent  meningococcal conjugate (MenACWY-D) vaccine. The doses should be obtained at least 2 months apart. Microbiologists working with certain meningococcal bacteria, [place] recruits, people at risk during an outbreak, and people who travel to or live in countries with a high rate of meningitis should be immunized. A first-year college student up through age 40 years who is living in a residence hall should receive a dose if she did not receive a dose on or after her 16th birthday. Adults who have certain high-risk conditions should receive one or more doses of vaccine.  Hepatitis A vaccine. Adults who wish to be protected from this disease, have certain high-risk conditions, work with hepatitis A-infected animals, work in hepatitis A research labs, or travel to or work in countries with a high rate of hepatitis A should be immunized. Adults who were previously unvaccinated and who anticipate close contact with an international adoptee during the first 60 days after arrival in the [place] from a country with a high rate of hepatitis A should be immunized.  Hepatitis B vaccine. Adults who wish to be protected from this disease, have certain high-risk conditions, may be exposed to blood or other infectious body fluids, are household contacts or sex partners of hepatitis B positive people, are clients or workers in certain care facilities, or travel to or work in countries with a high rate of hepatitis B should be immunized.  Haemophilus influenzae type b (Hib) vaccine. A previously unvaccinated person with asplenia or sickle cell disease or having a scheduled  splenectomy should receive 1 dose of Hib vaccine. Regardless of previous immunization, a recipient of a hematopoietic stem cell transplant should receive a 3-dose series 6 12 months after her successful transplant. Hib vaccine is not recommended for adults with HIV infection. Preventive Services / Frequency Ages 70 to 39years  Blood pressure check.** /  Every 1 to 2 years.  Lipid and cholesterol check.** / Every 5 years beginning at age 47.  Clinical breast exam.** / Every 3 years for women in their 40s and 50s.  BRCA-related cancer risk assessment.** / For women who have family members with a BRCA-related cancer (breast, ovarian, tubal, or peritoneal cancers).  Pap test.** / Every 2 years from ages 24 through 35. Every 3 years starting at age 50 through age 70 or 29 with a history of 3 consecutive normal Pap tests.  HPV screening.** / Every 3 years from ages 56 through ages 41 to 37 with a history of 3 consecutive normal Pap tests.  Hepatitis C blood test.** / For any individual with known risks for hepatitis C.  Skin self-exam. / Monthly.  Influenza vaccine. / Every year.  Tetanus, diphtheria, and acellular pertussis (Tdap, Td) vaccine.** / Consult your health care provider. Pregnant women should receive 1 dose of Tdap vaccine during each pregnancy. 1 dose of Td every 10 years.  Varicella vaccine.** / Consult your health care provider. Pregnant females who do not have evidence of immunity should receive the first dose after pregnancy.  HPV vaccine. / 3 doses over 6 months, if 33 and younger. The vaccine is not recommended for use in pregnant females. However, pregnancy testing is not needed before receiving a dose.  Measles, mumps, rubella (MMR) vaccine.** / You need at least 1 dose of MMR if you were born in 1957 or later. You may also need a 2nd dose. For females of childbearing age, rubella immunity should be determined. If there is no evidence of immunity, females who are not pregnant should be vaccinated. If there is no evidence of immunity, females who are pregnant should delay immunization until after pregnancy.  Pneumococcal 13-valent conjugate (PCV13) vaccine.** / Consult your health care provider.  Pneumococcal polysaccharide (PPSV23) vaccine.** / 1 to 2 doses if you smoke cigarettes or if you have certain  conditions.  Meningococcal vaccine.** / 1 dose if you are age 16 to 82 years and a Market researcher living in a residence hall, or have one of several medical conditions, you need to get vaccinated against meningococcal disease. You may also need additional booster doses.  Hepatitis A vaccine.** / Consult your health care provider.  Hepatitis B vaccine.** / Consult your health care provider.  Haemophilus influenzae type b (Hib) vaccine.** / Consult your health care provider. Ages 75 to 64years  Blood pressure check.** / Every 1 to 2 years.  Lipid and cholesterol check.** / Every 5 years beginning at age 59 years.  Lung cancer screening. / Every year if you are aged 29 80 years and have a 30-pack-year history of smoking and currently smoke or have quit within the past 15 years. Yearly screening is stopped once you have quit smoking for at least 15 years or develop a health problem that would prevent you from having lung cancer treatment.  Clinical breast exam.** / Every year after age 31 years.  BRCA-related cancer risk assessment.** / For women who have family members with a BRCA-related cancer (breast, ovarian, tubal, or peritoneal cancers).  Mammogram.** / Every year beginning at age 47  years and continuing for as long as you are in good health. Consult with your health care provider.  Pap test.** / Every 3 years starting at age 20 years through age 4 or 63 years with a history of 3 consecutive normal Pap tests.  HPV screening.** / Every 3 years from ages 61 years through ages 84 to 20 years with a history of 3 consecutive normal Pap tests.  Fecal occult blood test (FOBT) of stool. / Every year beginning at age 32 years and continuing until age 77 years. You may not need to do this test if you get a colonoscopy every 10 years.  Flexible sigmoidoscopy or colonoscopy.** / Every 5 years for a flexible sigmoidoscopy or every 10 years for a colonoscopy beginning at age 59 years  and continuing until age 65 years.  Hepatitis C blood test.** / For all people born from [birth year] through 1963 and any individual with known risks for hepatitis C.  Skin self-exam. / Monthly.  Influenza vaccine. / Every year.  Tetanus, diphtheria, and acellular pertussis (Tdap/Td) vaccine.** / Consult your health care provider. Pregnant women should receive 1 dose of Tdap vaccine during each pregnancy. 1 dose of Td every 10 years.  Varicella vaccine.** / Consult your health care provider. Pregnant females who do not have evidence of immunity should receive the first dose after pregnancy.  Zoster vaccine.** / 1 dose for adults aged 63 years or older.  Measles, mumps, rubella (MMR) vaccine.** / You need at least 1 dose of MMR if you were born in 1957 or later. You may also need a 2nd dose. For females of childbearing age, rubella immunity should be determined. If there is no evidence of immunity, females who are not pregnant should be vaccinated. If there is no evidence of immunity, females who are pregnant should delay immunization until after pregnancy.  Pneumococcal 13-valent conjugate (PCV13) vaccine.** / Consult your health care provider.  Pneumococcal polysaccharide (PPSV23) vaccine.** / 1 to 2 doses if you smoke cigarettes or if you have certain conditions.  Meningococcal vaccine.** / Consult your health care provider.  Hepatitis A vaccine.** / Consult your health care provider.  Hepatitis B vaccine.** / Consult your health care provider.  Haemophilus influenzae type b (Hib) vaccine.** / Consult your health care provider. Ages 58 years and over  Blood pressure check.** / Every 1 to 2 years.  Lipid and cholesterol check.** / Every 5 years beginning at age 24 years.  Lung cancer screening. / Every year if you are aged 32 80 years and have a 30-pack-year history of smoking and currently smoke or have quit within the past 15 years. Yearly screening is stopped once you have quit smoking  for at least 15 years or develop a health problem that would prevent you from having lung cancer treatment.  Clinical breast exam.** / Every year after age 110 years.  BRCA-related cancer risk assessment.** / For women who have family members with a BRCA-related cancer (breast, ovarian, tubal, or peritoneal cancers).  Mammogram.** / Every year beginning at age 6 years and continuing for as long as you are in good health. Consult with your health care provider.  Pap test.** / Every 3 years starting at age 19 years through age 62 or 37 years with 3 consecutive normal Pap tests. Testing can be stopped between 65 and 70 years with 3 consecutive normal Pap tests and no abnormal Pap or HPV tests in the past 10 years.  HPV screening.** / Every 3 years  from ages 63 years through ages 11 or 31 years with a history of 3 consecutive normal Pap tests. Testing can be stopped between 65 and 70 years with 3 consecutive normal Pap tests and no abnormal Pap or HPV tests in the past 10 years.  Fecal occult blood test (FOBT) of stool. / Every year beginning at age 73 years and continuing until age 82 years. You may not need to do this test if you get a colonoscopy every 10 years.  Flexible sigmoidoscopy or colonoscopy.** / Every 5 years for a flexible sigmoidoscopy or every 10 years for a colonoscopy beginning at age 82 years and continuing until age 52 years.  Hepatitis C blood test.** / For all people born from [birth year] through 1965 and any individual with known risks for hepatitis C.  Osteoporosis screening.** / A one-time screening for women ages 49 years and over and women at risk for fractures or osteoporosis.  Skin self-exam. / Monthly.  Influenza vaccine. / Every year.  Tetanus, diphtheria, and acellular pertussis (Tdap/Td) vaccine.** / 1 dose of Td every 10 years.  Varicella vaccine.** / Consult your health care provider.  Zoster vaccine.** / 1 dose for adults aged 59 years or older.  Pneumococcal  13-valent conjugate (PCV13) vaccine.** / Consult your health care provider.  Pneumococcal polysaccharide (PPSV23) vaccine.** / 1 dose for all adults aged 40 years and older.  Meningococcal vaccine.** / Consult your health care provider.  Hepatitis A vaccine.** / Consult your health care provider.  Hepatitis B vaccine.** / Consult your health care provider.  Haemophilus influenzae type b (Hib) vaccine.** / Consult your health care provider. ** Family history and personal history of risk and conditions may change your health care provider's recommendations. Document Released: 05/06/2001 Document Revised: 12/29/2012 Document Reviewed: 08/05/2010 [facility] Patient Information 2014 [place], [place].

## 2013-07-06 NOTE — Assessment & Plan Note (Signed)
She has some DOE so I have ordered an ECHO to see how severe this is, I think this is AS

## 2013-07-06 NOTE — Assessment & Plan Note (Signed)
FLP today She is doing well on the statin 

## 2013-07-06 NOTE — Progress Notes (Signed)
Subjective:    Patient ID: [name], female    DOB: Jan 22, 1934, 80 y.o.   MRN: [id number]  Flank Pain This is a recurrent problem. The current episode started more than 1 month ago (for 4-5 months). The problem occurs intermittently. The problem is unchanged. Pain location: right upper flank. The quality of the pain is described as stabbing. The pain does not radiate. The pain is at a severity of 2/10. The pain is mild. The symptoms are aggravated by bending. Pertinent negatives include no abdominal pain, bladder incontinence, bowel incontinence, chest pain, dysuria, fever, headaches, leg pain, numbness, paresis, paresthesias, pelvic pain, perianal numbness, tingling, weakness or weight loss. Risk factors include history of cancer. She has tried NSAIDs for the symptoms. The treatment provided moderate relief.      Review of Systems  Constitutional: Negative.  Negative for fever, chills, weight loss, diaphoresis, appetite change and fatigue.  HENT: Negative.   Eyes: Negative.   Respiratory: Positive for shortness of breath. Negative for apnea, cough, choking, chest tightness, wheezing and stridor.   Cardiovascular: Negative.  Negative for chest pain, palpitations and leg swelling.  Gastrointestinal: Negative.  Negative for nausea, vomiting, abdominal pain, diarrhea, constipation, blood in stool and bowel incontinence.  Endocrine: Negative.  Negative for polydipsia, polyphagia and polyuria.  Genitourinary: Positive for flank pain. Negative for bladder incontinence, dysuria and pelvic pain.  Musculoskeletal: Negative.  Negative for arthralgias, back pain, gait problem, joint swelling, myalgias, neck pain and neck stiffness.  Skin: Negative.   Allergic/Immunologic: Negative.   Neurological: Negative.  Negative for dizziness, tingling, tremors, weakness, light-headedness, numbness, headaches and paresthesias.  Hematological: Negative.  Negative for adenopathy. Does not bruise/bleed  easily.  Psychiatric/Behavioral: Negative.        Objective:   Physical Exam  Vitals reviewed. Constitutional: She is oriented to person, place, and time. She appears well-developed and well-nourished.  Non-toxic appearance. She does not have a sickly appearance. She does not appear ill. No distress.  HENT:  Head: Normocephalic and atraumatic.  Mouth/Throat: Oropharynx is clear and moist. No oropharyngeal exudate.  Eyes: Conjunctivae are normal. Right eye exhibits no discharge. Left eye exhibits no discharge. No scleral icterus.  Neck: Normal range of motion. Neck supple. No JVD present. No tracheal deviation present. No thyromegaly present.  Cardiovascular: Normal rate, regular rhythm, S1 normal, S2 normal and intact distal pulses.  Exam reveals no gallop, no S3, no S4 and no friction rub.   Murmur heard.  Decrescendo systolic murmur is present with a grade of 1/6   No diastolic murmur is present  Pulses:      Carotid pulses are 1+ on the right side, and 1+ on the left side.      Radial pulses are 1+ on the right side, and 1+ on the left side.       Femoral pulses are 1+ on the right side, and 1+ on the left side.      Popliteal pulses are 1+ on the right side, and 1+ on the left side.       Dorsalis pedis pulses are 1+ on the right side, and 1+ on the left side.       Posterior tibial pulses are 1+ on the right side, and 1+ on the left side.  1/6 harsh SEM over RUSB (?AS)  Pulmonary/Chest: Effort normal and breath sounds normal. No stridor. No respiratory distress. She has no wheezes. She has no rales. She exhibits no tenderness.  Abdominal: Soft. Normal appearance  and bowel sounds are normal. She exhibits no shifting dullness, no distension, no pulsatile liver, no fluid wave, no abdominal bruit, no ascites, no pulsatile midline mass and no mass. There is no hepatosplenomegaly, splenomegaly or hepatomegaly. There is no tenderness. There is no rigidity, no rebound, no guarding, no CVA  tenderness, no tenderness at McBurney's point and negative Murphy's sign. No hernia. Hernia confirmed negative in the ventral area, confirmed negative in the right inguinal area and confirmed negative in the left inguinal area.  Musculoskeletal: Normal range of motion. She exhibits no edema and no tenderness.  Lymphadenopathy:    She has no cervical adenopathy.  Neurological: She is oriented to person, place, and time.  Skin: Skin is warm and dry. No rash noted. She is not diaphoretic. No erythema. No pallor.  Psychiatric: She has a normal mood and affect. Her behavior is normal. Judgment and thought content normal.     Lab Results  Component Value Date   WBC 5.9 05/09/2013   HGB 11.6* 05/09/2013   HCT 34.7* 05/09/2013   PLT 248 05/09/2013   GLUCOSE 100* 05/09/2013   CHOL 198 06/30/2012   TRIG 112.0 06/30/2012   HDL 58.20 06/30/2012   LDLDIRECT 133.2 09/04/2009   LDLCALC 117* 06/30/2012   ALT 19 05/09/2013   AST 23 05/09/2013   NA 143 05/09/2013   K 4.4 05/09/2013   CL 104 05/09/2013   CREATININE 0.88 05/09/2013   BUN 18 05/09/2013   CO2 25 05/09/2013   TSH 0.66 09/04/2009       Assessment & Plan:

## 2013-07-06 NOTE — Assessment & Plan Note (Signed)
I will check plain films to see if there is a boney lesion, met, etc Will also check her urine to see if there is blood or infection She will cont advil as needed

## 2013-08-24 ENCOUNTER — Other Ambulatory Visit: Payer: Self-pay

## 2013-08-24 MED ORDER — PRAVASTATIN SODIUM 40 MG PO TABS: ORAL_TABLET | ORAL | Status: DC

## 2013-08-25 ENCOUNTER — Other Ambulatory Visit: Payer: Self-pay

## 2013-08-25 MED ORDER — PRAVASTATIN SODIUM 40 MG PO TABS: ORAL_TABLET | ORAL | Status: DC

## 2013-08-26 ENCOUNTER — Telehealth: Payer: Self-pay

## 2013-08-26 MED ORDER — PRAVASTATIN SODIUM 40 MG PO TABS: ORAL_TABLET | ORAL | Status: DC

## 2013-08-26 NOTE — Telephone Encounter (Signed)
Refill done.  

## 2013-09-08 ENCOUNTER — Other Ambulatory Visit: Payer: Self-pay | Admitting: Internal Medicine

## 2013-09-08 ENCOUNTER — Telehealth: Payer: Self-pay | Admitting: Internal Medicine

## 2013-09-08 MED ORDER — HYDROCHLOROTHIAZIDE 25 MG PO TABS: ORAL_TABLET | ORAL | Status: DC

## 2013-09-08 NOTE — Telephone Encounter (Signed)
Patient left message requesting refill on her fluid pill, hydrochlorothiazide.  Please send to [place].

## 2013-09-08 NOTE — Telephone Encounter (Signed)
This has been done.

## 2013-10-27 ENCOUNTER — Ambulatory Visit (HOSPITAL_BASED_OUTPATIENT_CLINIC_OR_DEPARTMENT_OTHER): Payer: Medicare Other | Admitting: Oncology

## 2013-10-27 ENCOUNTER — Telehealth: Payer: Self-pay | Admitting: Oncology

## 2013-10-27 ENCOUNTER — Other Ambulatory Visit (HOSPITAL_BASED_OUTPATIENT_CLINIC_OR_DEPARTMENT_OTHER): Payer: Medicare Other

## 2013-10-27 VITALS — BP 137/75 | HR 77 | Temp 98.7°F | Resp 18 | Ht 60.0 in | Wt 191.3 lb

## 2013-10-27 DIAGNOSIS — C50319 Malignant neoplasm of lower-inner quadrant of unspecified female breast: Secondary | ICD-10-CM

## 2013-10-27 DIAGNOSIS — M899 Disorder of bone, unspecified: Secondary | ICD-10-CM

## 2013-10-27 DIAGNOSIS — M949 Disorder of cartilage, unspecified: Secondary | ICD-10-CM

## 2013-10-27 DIAGNOSIS — C50312 Malignant neoplasm of lower-inner quadrant of left female breast: Secondary | ICD-10-CM

## 2013-10-27 DIAGNOSIS — H548 Legal blindness, as defined in USA: Secondary | ICD-10-CM

## 2013-10-27 LAB — CBC WITH DIFFERENTIAL/PLATELET
BASO%: 0.5 % (ref 0.0–2.0)
Basophils Absolute: 0 10*3/uL (ref 0.0–0.1)
EOS ABS: 0.1 10*3/uL (ref 0.0–0.5)
EOS%: 1.4 % (ref 0.0–7.0)
HCT: 36.6 % (ref 34.8–46.6)
HEMOGLOBIN: 11.5 g/dL — AB (ref 11.6–15.9)
LYMPH#: 1.9 10*3/uL (ref 0.9–3.3)
LYMPH%: 38.9 % (ref 14.0–49.7)
MCH: 27 pg (ref 25.1–34.0)
MCHC: 31.5 g/dL (ref 31.5–36.0)
MCV: 85.9 fL (ref 79.5–101.0)
MONO#: 0.3 10*3/uL (ref 0.1–0.9)
MONO%: 6.6 % (ref 0.0–14.0)
NEUT#: 2.6 10*3/uL (ref 1.5–6.5)
NEUT%: 52.6 % (ref 38.4–76.8)
Platelets: 218 10*3/uL (ref 145–400)
RBC: 4.26 10*6/uL (ref 3.70–5.45)
RDW: 14.7 % — AB (ref 11.2–14.5)
WBC: 4.9 10*3/uL (ref 3.9–10.3)

## 2013-10-27 LAB — COMPREHENSIVE METABOLIC PANEL (CC13)
ALBUMIN: 3.7 g/dL (ref 3.5–5.0)
ALT: 11 U/L (ref 0–55)
AST: 19 U/L (ref 5–34)
Alkaline Phosphatase: 51 U/L (ref 40–150)
Anion Gap: 11 mEq/L (ref 3–11)
BUN: 13.7 mg/dL (ref 7.0–26.0)
CALCIUM: 9.4 mg/dL (ref 8.4–10.4)
CHLORIDE: 108 meq/L (ref 98–109)
CO2: 23 mEq/L (ref 22–29)
Creatinine: 1 mg/dL (ref 0.6–1.1)
Glucose: 117 mg/dl (ref 70–140)
POTASSIUM: 4.3 meq/L (ref 3.5–5.1)
Sodium: 142 mEq/L (ref 136–145)
Total Bilirubin: 0.39 mg/dL (ref 0.20–1.20)
Total Protein: 7.5 g/dL (ref 6.4–8.3)

## 2013-10-27 NOTE — Progress Notes (Signed)
[facility]  [phone] [phone]     ID: [name] OB: [birth year]  MR#: [id number]  JME#:[id number]  PCP: Adella Hare, MD GYN:  Newton Pigg SU: Fanny Skates OTHER MD: Gery Pray  CHIEF COMPLAINT: Early stage estrogen receptor positive breast cancer CURRENT TREATMENT: Tamoxifen  HISTORY OF PRESENT ILLNESS: [place] had routine screening mammography at [facility] 03/19/2012 showing a potential abnormality in the left breast. Additional views 03/26/2012 showed no discrete mass or architectural distortion. Left breast ultrasonography was negative.  Short interval followup was suggested and done 09/30/2012 the patient had left diagnostic mammography which showed no concerning change.  Bilateral diagnostic mammography 03/22/2013 showed the patient's breast density to be category B. There was an irregular focal asymmetry in the left breast at 8:00, however ultrasound of this area showed no corresponding abnormality. Accordingly a stereotactic biopsy of the area in question in the left breast was performed 03/28/2013, and showed ([id number] [id number]) an invasive ductal carcinoma, with lobular features, grade 1, estrogen receptor 99% positive, progesterone receptor 100% positive, both with strong staining intensity, with an MIB-1 of 6%, and no HER-2 amplification, the signals ratio being 1.35 and the copy number per cell 1.75.  MRI of the breast was obtained 04/05/2013. This confirmed a 2 cm irregular spiculated mass in the upper inner quadrant of the left breast with no additional areas of concern in that breast. In the right breast however at the 12:00 position there was a 1.3 cm irregular spiculated mass. There were 2 oval enhancing masses in the right breast felt consistent with intramammary lymph nodes. There were no abnormal appearing lymph nodes otherwise.  The patient's subsequent history is as detailed below  INTERVAL HISTORY: [name] returns today for  followup of her breast cancer accompanied by her daughter [name] and her grandson. After the last visit here the patient started tamoxifen. It was not expensive, and aside from a few extra hot flashes she is tolerating it well.  REVIEW OF SYSTEMS: [name] has no new complaints. She mostly spends her time taking care of her husband, whom she describes as severely demented. Sometimes she has brief shooting pains in the left breast and she was reassured that these are not related to cancer but to the surgery she received. A detailed review of systems today was otherwise noncontributory  PAST MEDICAL HISTORY: Past Medical History  Diagnosis Date  . VITAMIN D DEFICIENCY 01/25/2009  . HYPERLIPIDEMIA 12/14/2006  . BLINDNESS, [place], [place] DEFINITION 12/14/2006  . EXTERNAL HEMORRHOIDS 09/30/2007  . GERD 10/01/2007  . CONSTIPATION 12/14/2006  . RECTAL BLEEDING 10/01/2007  . OSTEOPENIA 02/07/2008  . OOPHORECTOMY, RIGHT, HX OF 12/14/2006  . ROTATOR CUFF REPAIR, RIGHT, HX OF 12/14/2006  . Anemia   . Arthritis   . Cancer     breast  . Edema   . Blind     PAST SURGICAL HISTORY: Past Surgical History  Procedure Laterality Date  . Rotator cuff repair      right  . Right oophorectomy    . Irrigation and debridement sebaceous cyst  06/07    [name]  . Tubal ligation    . Le art doppler   '98    normal  . Nuc stress test   07/07    normal study  . Abdominal hysterectomy    . Partial mastectomy with needle localization Left 05/11/2013    Procedure: PARTIAL MASTECTOMY WITH NEEDLE LOCALIZATION;  Surgeon: [name], MD;  Location: [facility];  Service:  General;  Laterality: Left;    FAMILY HISTORY Family History  Problem Relation Age of Onset  . Hypertension Father   . Dementia Father   . Cancer Neg Hx     Negative for breast or colon cancer  . Heart disease Neg Hx    the patient's father was murdered at the age of 19. The patient's mother died in her sleep at the age of 64. The  patient has 3 brothers, 6 sisters. She has one cousin with breast cancer, but does not know at what age she was diagnosed. She also has a cousin with ovarian cancer, but again does not know at what age it was diagnosed.  GYNECOLOGIC HISTORY:  Menarche age 58, first live birth age 80, the patient is [place] P5. She went through the change of life at age 59. She did not use hormone replacement.   SOCIAL HISTORY:  [name] used to work at [employer], but is now retired. Her husband "[name]" is also blind. He suffers from significant Alzheimer's disease. The patient lives with her husband and 2 grandchildren, [name], 10, and [name], [name]. The patient's children are Sunday Spillers, who works in [place] in Programmer, applications for nursing agency; Trilby Drummer who works in [place] as a Curator; Surveyor, mining, who works in [place] in Psychologist, educational; [name], who also lives in [place]; and [place], who is a Education officer, environmental. The patient has 6 grandchildren, four great-grandchildren. She attends a local [place]: Not in place; the patient intends to name her daughter [name] as healthcare power of attorney. [name] can be reached at [phone]   HEALTH MAINTENANCE: History  Substance Use Topics  . Smoking status: Never Smoker   . Smokeless tobacco: Not on file  . Alcohol Use: No     Colonoscopy:  PAP:  Bone density: 04/07/2013 at [facility] showed osteopenia with a T score of -2.2 at the left femoral neck  Lipid panel:  No Known Allergies  Current Outpatient Prescriptions  Medication Sig Dispense Refill  . fexofenadine (ALLEGRA) 180 MG tablet Take 1 tablet (180 mg total) by mouth as needed.  30 tablet  11  . hydrochlorothiazide (HYDRODIURIL) 25 MG tablet TAKE 1 TABLET BY MOUTH AS NEEDED  30 tablet  5  . ibuprofen (ADVIL) 200 MG tablet Take 200 mg by mouth as needed.        . pravastatin (PRAVACHOL) 40 MG tablet TAKE 1 TABLET BY MOUTH AT BEDTIME  30 tablet  0  . ranitidine (ZANTAC) 150 MG  tablet Take 150 mg by mouth daily as needed.       . tamoxifen (NOLVADEX) 20 MG tablet Take 1 tablet (20 mg total) by mouth daily.  30 tablet  12   No current facility-administered medications for this visit.    OBJECTIVE: Older African American woman who appears stated age 80 Vitals:   10/27/13 0957  BP: 137/75  Pulse: 77  Temp: 98.7 F (37.1 C)  Resp: 18     Body mass index is 37.36 kg/(m^2).    ECOG FS:1 - Symptomatic but completely ambulatory  Patient is legally blind Oropharynx clear, teeth in fair repair No cervical or supraclavicular adenopathy Lungs no rales or rhonchi Heart regular rate and rhythm Abd soft, nontender, positive bowel sounds MSK no focal spinal tenderness, no upper extremity lymphedema Neuro: nonfocal, well oriented, positive affect Breasts: The right breast is unremarkable. The left breast is status post lumpectomy. There is no evidence of local recurrence. The left  axilla is benign   LAB RESULTS:  CMP     Component Value Date/Time   NA 140 07/06/2013 1135   NA 142 04/06/2013 1226   K 4.1 07/06/2013 1135   K 3.9 04/06/2013 1226   CL 107 07/06/2013 1135   CO2 27 07/06/2013 1135   CO2 23 04/06/2013 1226   GLUCOSE 94 07/06/2013 1135   GLUCOSE 109 04/06/2013 1226   BUN 16 07/06/2013 1135   BUN 12.9 04/06/2013 1226   CREATININE 0.8 07/06/2013 1135   CREATININE 0.9 04/06/2013 1226   CALCIUM 9.9 07/06/2013 1135   CALCIUM 9.3 04/06/2013 1226   CALCIUM 9.9 12/24/2010 1018   PROT 7.9 07/06/2013 1135   PROT 7.5 04/06/2013 1226   ALBUMIN 4.2 07/06/2013 1135   ALBUMIN 3.8 04/06/2013 1226   AST 23 07/06/2013 1135   AST 19 04/06/2013 1226   ALT 15 07/06/2013 1135   ALT 15 04/06/2013 1226   ALKPHOS 75 07/06/2013 1135   ALKPHOS 75 04/06/2013 1226   BILITOT 0.7 07/06/2013 1135   BILITOT 0.37 04/06/2013 1226   GFRNONAA 62* 05/09/2013 0920   GFRAA 72* 05/09/2013 0920    I No results found for this basename: SPEP,  UPEP,   kappa and lambda light chains    Lab Results   Component Value Date   WBC 4.9 10/27/2013   NEUTROABS 2.6 10/27/2013   HGB 11.5* 10/27/2013   HCT 36.6 10/27/2013   MCV 85.9 10/27/2013   PLT 218 10/27/2013      Chemistry      Component Value Date/Time   NA 140 07/06/2013 1135   NA 142 04/06/2013 1226   K 4.1 07/06/2013 1135   K 3.9 04/06/2013 1226   CL 107 07/06/2013 1135   CO2 27 07/06/2013 1135   CO2 23 04/06/2013 1226   BUN 16 07/06/2013 1135   BUN 12.9 04/06/2013 1226   CREATININE 0.8 07/06/2013 1135   CREATININE 0.9 04/06/2013 1226      Component Value Date/Time   CALCIUM 9.9 07/06/2013 1135   CALCIUM 9.3 04/06/2013 1226   CALCIUM 9.9 12/24/2010 1018   ALKPHOS 75 07/06/2013 1135   ALKPHOS 75 04/06/2013 1226   AST 23 07/06/2013 1135   AST 19 04/06/2013 1226   ALT 15 07/06/2013 1135   ALT 15 04/06/2013 1226   BILITOT 0.7 07/06/2013 1135   BILITOT 0.37 04/06/2013 1226       No results found for this basename: LABCA2    No components found with this basename: LABCA125    No results found for this basename: INR,  in the last 168 hours  Urinalysis    Component Value Date/Time   COLORURINE YELLOW 07/06/2013 [place] 07/06/2013 1135   LABSPEC 1.020 07/06/2013 1135   PHURINE 5.5 07/06/2013 1135   GLUCOSEU NEGATIVE 07/06/2013 1135   GLUCOSEU NEGATIVE 05/09/2013 0848   HGBUR TRACE-LYSED* 07/06/2013 1135   BILIRUBINUR NEGATIVE 07/06/2013 1135   KETONESUR NEGATIVE 07/06/2013 1135   PROTEINUR NEGATIVE 05/09/2013 0848   UROBILINOGEN 0.2 07/06/2013 1135   NITRITE NEGATIVE 07/06/2013 1135   LEUKOCYTESUR NEGATIVE 07/06/2013 1135    STUDIES: Bone density obtained at [facility] 15 2015 was reviewed with the patient and her daughter  ASSESSMENT: 10 y.o. [place] woman status post left breast biopsy 03/28/2013 for a clinical T1c N0, stage IA invasive (lobular) carcinoma, grade 1, estrogen and progesterone receptor positive, HER-2 negative, with an MIB-1 of 6%  (1) a 1.3 cm lesion noted by MRI  in the right breast was proven benign by  biopsy 04/20/2013  (2) status post left lumpectomy without salpingo-oophorectomy 05/11/2013 for a pT1c cN0, stage IA invasive ductal carcinoma, grade 1, repeat HER-2 again negative, with ample margins  (3) the patient was evaluated by radiation oncology, who felt if she took anti-estrogens it would be safe for her to forego radiation  (4) tamoxifen started April 2015  (5) osteopenia, T score of -2.25 March 2013   PLAN: Meghen is tolerating the tamoxifen without any untoward side effects. The plan will be to continue this for at least 5 years, possibly ten. Because of transportation issues I am going to start seeing her on an every 6 month basis instead of the usual every 3 months this. She will be due for repeat mammography February of next year  Taylorann has a good understanding of the overall plan. She agrees with it. She does the goal of treatment in her case is cure. She will call for any problems that may develop before her next visit here.   Chauncey Cruel, MD   10/27/2013 10:10 AM

## 2013-10-27 NOTE — Telephone Encounter (Signed)
per pof to sch pt appt-cld 7 spoke to ptr to adv of next appt-adv pt i would mail a copy of sch-pt understood

## 2013-11-11 ENCOUNTER — Ambulatory Visit (INDEPENDENT_AMBULATORY_CARE_PROVIDER_SITE_OTHER): Payer: No Typology Code available for payment source | Admitting: General Surgery

## 2013-11-11 ENCOUNTER — Encounter (INDEPENDENT_AMBULATORY_CARE_PROVIDER_SITE_OTHER): Payer: Self-pay | Admitting: General Surgery

## 2013-11-11 VITALS — BP 134/76 | HR 84 | Temp 98.7°F | Resp 18 | Ht 61.0 in | Wt 191.0 lb

## 2013-11-11 DIAGNOSIS — C50319 Malignant neoplasm of lower-inner quadrant of unspecified female breast: Secondary | ICD-10-CM

## 2013-11-11 DIAGNOSIS — C50312 Malignant neoplasm of lower-inner quadrant of left female breast: Secondary | ICD-10-CM

## 2013-11-11 NOTE — Progress Notes (Signed)
Patient ID: [name], female   DOB: 1933-11-12, 80 y.o.   MRN: [id number] History:  Patient returns for long-term followup This patient underwent left partial mastectomy with needle localization on 05/11/2013. Pathology report shows a 1.1 cm invasive ductal carcinoma with negative margins. ER-positive 100%, PR-positive 100%, HER-2-negative. Ki 67 6%. Pathologic stage TI C., NX. Stage IA.  She has no complaints about wound healing, And she does not have any complaints about her left arm.. Minimal pain. No drainage or fever.  She is now on tamoxifen, and Dr. Jana Hakim plans to continue this for 5 and possibly 10 years. Dr. Sondra Come decided against radiation therapy.  Exam:  Patient looks well. No distress. She is blind. Family member with her. Left breast incision upper inner quadrant healed normally. Minimal cosmetic defect. Good symmetry. Minimal thickening. No infection. No drainage. No hematoma  Range of motion left shoulder is 100%  Right breast exam is unremarkable Neck reveals no adenopathy or mass Lungs clear to auscultation bilaterally Heart regular rate and rhythm. No murmur. No ectopy.  Assessment:  Invasive carcinoma left breast, receptor positive, HER-2-negative, stage T1c., NX. Recovery uneventfully following the partial mastectomy   Plan:  Continue tamoxifen and routine followup with Dr. Jana Hakim.  Mammograms in late December or January Return to see me in early February, 2016   .  [name]. [name], M.D., [facility]  General and Minimally invasive Surgery  Breast and Colorectal Surgery  Office: [phone]  Pager: [phone]

## 2013-11-11 NOTE — Patient Instructions (Signed)
Your Left breast lumpectomy site has healed without any obvious problem.  Examination of both breasts and all of the lymph node areas is normal. There is no evidence of cancer.  Continue to take the tamoxifen medicine and keep your regular appointments with Dr. Jana Hakim  Be sure to get bilateral mammograms in December or January  Return to see Dr. Dalbert Batman in February, 2016 for a one year check up

## 2014-03-06 ENCOUNTER — Telehealth (INDEPENDENT_AMBULATORY_CARE_PROVIDER_SITE_OTHER): Payer: Self-pay

## 2014-03-06 NOTE — Telephone Encounter (Addendum)
Dr. Isaiah Blakes at [facility] requesting an order for diagnostic bilateral mammogram be placed. Pt had partial mastectomy in February 2014 for breast cancer.  Request sent to Dr. Dalbert Batman.  Pt has appt on 03/31/14 at [facility] for the mammogram.

## 2014-03-21 ENCOUNTER — Other Ambulatory Visit: Payer: Self-pay | Admitting: Internal Medicine

## 2014-03-22 ENCOUNTER — Ambulatory Visit (INDEPENDENT_AMBULATORY_CARE_PROVIDER_SITE_OTHER): Payer: Medicare Other

## 2014-03-22 ENCOUNTER — Ambulatory Visit (INDEPENDENT_AMBULATORY_CARE_PROVIDER_SITE_OTHER): Payer: Medicare Other | Admitting: Podiatry

## 2014-03-22 VITALS — BP 131/81 | HR 90 | Resp 14 | Ht 62.0 in | Wt 180.0 lb

## 2014-03-22 DIAGNOSIS — M79673 Pain in unspecified foot: Secondary | ICD-10-CM

## 2014-03-22 DIAGNOSIS — B351 Tinea unguium: Secondary | ICD-10-CM

## 2014-03-22 DIAGNOSIS — M7740 Metatarsalgia, unspecified foot: Secondary | ICD-10-CM

## 2014-03-22 NOTE — Progress Notes (Signed)
   Subjective:    Patient ID: [name], female    DOB: 21-Oct-1933, 80 y.o.   MRN: [id number]  Foot Pain   80 year old female presents the office today with complaints of painful elongated toenails. She states that she is unable to trim herself. She says her nails are painful particularly with shoe gear. She also states that she has pain in the balls of her feet bilaterally. She states of the become more painful with prolonged activity after standing for periods of time. She states the pain is not every day and is intermittent in nature. She states that the pain has been present for greater than 6 months. She denies any recent injury or trauma to the area. She said no prior treatment. No other complaints at this time.    Review of Systems  All other systems reviewed and are negative.      Objective:   Physical Exam AAO 3, NAD DP/PT pulses palpable, CRT less than 3 seconds Protective sensation intact with Simms Weinstein monofilament, vibratory sensation intact, Achilles tendon reflex intact. Nails hypertrophic, dystrophic, elongated, brittle, discolored 10. No surrounding erythema or drainage from around the nail sites. Diffuse tenderness to bilateral plantar metatarsal heads 1 through 5. There is prominent metatarsal heads plantarly with mild atrophy of the fat pad. There is no pinpoint bony tenderness or pain with vibratory sensation overlying the metatarsals or digits. There is no pain with MTPJ range of motion. No overlying edema, erythema, increase in warmth. Mild hammertoe contractures bilaterally MMT 4/5, ROM WNL No open lesions or pre-ulcerative lesions. No interdigital maceration. No pain with calf compression, swelling, warmth, erythema.     Assessment & Plan:  80 year old female with bilateral metatarsalgia, prominent metatarsal heads; symptomatic onychomycosis -X-rays were obtained and reviewed with the patient. -Discussed likely etiology of foot pain. Dispensed  various offloading pads to help alleviate her symptoms. Discussed shoe gear modifications and possible orthotics to help support her foot type. Continue to monitor the area for any changes. -Nail sharply debrided 10 without complications. -Discussed the importance of daily foot inspection. -Follow-up in 3 months or sooner should any problems arise or any change in symptoms. In the meantime encouraged to call the office they questions or concerns.

## 2014-03-22 NOTE — Patient Instructions (Signed)
Ingrown Toenail An ingrown toenail occurs when the sharp edge of your toenail grows into the skin. Causes of ingrown toenails include toenails clipped too far back or poorly fitting shoes. Activities involving sudden stops (basketball, tennis) causing "toe jamming" may lead to an ingrown nail. HOME CARE INSTRUCTIONS   Soak the whole foot in warm soapy water for 20 minutes, 3 times per day.  You may lift the edge of the nail away from the sore skin by wedging a small piece of cotton under the corner of the nail. Be careful not to dig (traumatize) and cause more injury to the area.  Wear shoes that fit well. While the ingrown nail is causing problems, sandals may be beneficial.  Trim your toenails regularly and carefully. Cut your toenails straight across, not in a curve. This will prevent injury to the skin at the corners of the toenail.  Keep your feet clean and dry.  Crutches may be helpful early in treatment if walking is painful.  Antibiotics, if prescribed, should be taken as directed.  Return for a wound check in 2 days or as directed.  Only take over-the-counter or prescription medicines for pain, discomfort, or fever as directed by your caregiver. SEEK IMMEDIATE MEDICAL CARE IF:   You have a fever.  You have increasing pain, redness, swelling, or heat at the wound site.  Your toe is not better in 7 days. If conservative treatment is not successful, surgical removal of a portion or all of the nail may be necessary. MAKE SURE YOU:   Understand these instructions.  Will watch your condition.  Will get help right away if you are not doing well or get worse. Document Released: 03/07/2000 Document Revised: 06/02/2011 Document Reviewed: 03/01/2008 ExitCare Patient Information 2015 [employer]. This information is not intended to replace advice given to you by your health care provider. Make sure you discuss any questions you have with your health care provider.  

## 2014-03-31 DIAGNOSIS — Z853 Personal history of malignant neoplasm of breast: Secondary | ICD-10-CM | POA: Diagnosis not present

## 2014-05-01 ENCOUNTER — Ambulatory Visit (HOSPITAL_BASED_OUTPATIENT_CLINIC_OR_DEPARTMENT_OTHER): Payer: Medicare Other | Admitting: Oncology

## 2014-05-01 ENCOUNTER — Other Ambulatory Visit (HOSPITAL_BASED_OUTPATIENT_CLINIC_OR_DEPARTMENT_OTHER): Payer: Medicare Other

## 2014-05-01 VITALS — BP 127/75 | HR 77 | Temp 97.8°F | Resp 18 | Ht 62.0 in | Wt 182.5 lb

## 2014-05-01 DIAGNOSIS — M8580 Other specified disorders of bone density and structure, unspecified site: Secondary | ICD-10-CM

## 2014-05-01 DIAGNOSIS — Z170 Estrogen receptor positive status [ER+]: Secondary | ICD-10-CM | POA: Diagnosis not present

## 2014-05-01 DIAGNOSIS — C50312 Malignant neoplasm of lower-inner quadrant of left female breast: Secondary | ICD-10-CM

## 2014-05-01 LAB — CBC WITH DIFFERENTIAL/PLATELET
BASO%: 1.1 % (ref 0.0–2.0)
Basophils Absolute: 0.1 10*3/uL (ref 0.0–0.1)
EOS%: 0.8 % (ref 0.0–7.0)
Eosinophils Absolute: 0.1 10*3/uL (ref 0.0–0.5)
HEMATOCRIT: 37.9 % (ref 34.8–46.6)
HGB: 12 g/dL (ref 11.6–15.9)
LYMPH#: 2.5 10*3/uL (ref 0.9–3.3)
LYMPH%: 37 % (ref 14.0–49.7)
MCH: 26.9 pg (ref 25.1–34.0)
MCHC: 31.6 g/dL (ref 31.5–36.0)
MCV: 85.2 fL (ref 79.5–101.0)
MONO#: 0.5 10*3/uL (ref 0.1–0.9)
MONO%: 6.9 % (ref 0.0–14.0)
NEUT#: 3.6 10*3/uL (ref 1.5–6.5)
NEUT%: 54.2 % (ref 38.4–76.8)
Platelets: 235 10*3/uL (ref 145–400)
RBC: 4.45 10*6/uL (ref 3.70–5.45)
RDW: 14.7 % — ABNORMAL HIGH (ref 11.2–14.5)
WBC: 6.7 10*3/uL (ref 3.9–10.3)

## 2014-05-01 LAB — COMPREHENSIVE METABOLIC PANEL (CC13)
ALBUMIN: 4 g/dL (ref 3.5–5.0)
ALT: 14 U/L (ref 0–55)
AST: 20 U/L (ref 5–34)
Alkaline Phosphatase: 57 U/L (ref 40–150)
Anion Gap: 14 mEq/L — ABNORMAL HIGH (ref 3–11)
BILIRUBIN TOTAL: 0.26 mg/dL (ref 0.20–1.20)
BUN: 14.2 mg/dL (ref 7.0–26.0)
CALCIUM: 9.9 mg/dL (ref 8.4–10.4)
CO2: 23 mEq/L (ref 22–29)
Chloride: 105 mEq/L (ref 98–109)
Creatinine: 0.9 mg/dL (ref 0.6–1.1)
EGFR: 67 mL/min/{1.73_m2} — ABNORMAL LOW (ref >90)
Glucose: 96 mg/dl (ref 70–140)
POTASSIUM: 3.8 meq/L (ref 3.5–5.1)
Sodium: 142 mEq/L (ref 136–145)
Total Protein: 7.8 g/dL (ref 6.4–8.3)

## 2014-05-01 MED ORDER — TAMOXIFEN CITRATE 20 MG PO TABS: 20.0000 mg | ORAL_TABLET | Freq: Every day | ORAL | Status: DC

## 2014-05-01 NOTE — Addendum Note (Signed)
Addended by: Prentiss Bells on: 05/01/2014 05:21 PM   Modules accepted: Medications

## 2014-05-01 NOTE — Progress Notes (Signed)
[name]  Telephone:([phone]) [phone] [phone]     ID: [name] OB: [birth year]  MR#: [id number]  EHU#:[id number]  PCP: Scarlette Calico, MD GYN:  Newton Pigg SU: Fanny Skates OTHER MD: Gery Pray  CHIEF COMPLAINT: Early stage estrogen receptor positive breast cancer CURRENT TREATMENT: Tamoxifen  HISTORY OF PRESENT ILLNESS: [name] had routine screening mammography at [facility] 03/19/2012 showing a potential abnormality in the left breast. Additional views 03/26/2012 showed no discrete mass or architectural distortion. Left breast ultrasonography was negative.  Short interval followup was suggested and done 09/30/2012 the patient had left diagnostic mammography which showed no concerning change.  Bilateral diagnostic mammography 03/22/2013 showed the patient's breast density to be category B. There was an irregular focal asymmetry in the left breast at 8:00, however ultrasound of this area showed no corresponding abnormality. Accordingly a stereotactic biopsy of the area in question in the left breast was performed 03/28/2013, and showed (SAA [id number]) an invasive ductal carcinoma, with lobular features, grade 1, estrogen receptor 99% positive, progesterone receptor 100% positive, both with strong staining intensity, with an MIB-1 of 6%, and no HER-2 amplification, the signals ratio being 1.35 and the copy number per cell 1.75.  MRI of the breast was obtained 04/05/2013. This confirmed a 2 cm irregular spiculated mass in the upper inner quadrant of the left breast with no additional areas of concern in that breast. In the right breast however at the 12:00 position there was a 1.3 cm irregular spiculated mass. There were 2 oval enhancing masses in the right breast felt consistent with intramammary lymph nodes. There were no abnormal appearing lymph nodes otherwise.  The patient's subsequent history is as detailed below  INTERVAL HISTORY: [name] returns today for  followup of her breast cancer accompanied by her daughter [name]. The interval history is unremarkable. [name] continues on tamoxifen. She has occasional hot flashes and a very minimal vaginal discharge. She obtains a drug that are very good price  REVIEW OF SYSTEMS: Asked what her worse problem is [name] says she has no "worse problem". She did have some problems with her toes, went and saw a foot doctor and was given some pads to use, which of been minimally helpful. They have been no unusual headaches, nausea, vomiting, cough, phlegm production, pleurisy, or change in bowel or bladder habits. A detailed review of systems today was otherwise stable  PAST MEDICAL HISTORY: Past Medical History  Diagnosis Date  . VITAMIN D DEFICIENCY 01/25/2009  . HYPERLIPIDEMIA 12/14/2006  . BLINDNESS, [place], [place] DEFINITION 12/14/2006  . EXTERNAL HEMORRHOIDS 09/30/2007  . GERD 10/01/2007  . CONSTIPATION 12/14/2006  . RECTAL BLEEDING 10/01/2007  . OSTEOPENIA 02/07/2008  . OOPHORECTOMY, RIGHT, HX OF 12/14/2006  . ROTATOR CUFF REPAIR, RIGHT, HX OF 12/14/2006  . Anemia   . Arthritis   . Cancer     breast  . Edema   . Blind     PAST SURGICAL HISTORY: Past Surgical History  Procedure Laterality Date  . Rotator cuff repair      right  . Right oophorectomy    . Irrigation and debridement sebaceous cyst  06/07    [name]  . Tubal ligation    . Le art doppler   '98    normal  . Nuc stress test   07/07    normal study  . Abdominal hysterectomy    . Partial mastectomy with needle localization Left 05/11/2013    Procedure: PARTIAL MASTECTOMY WITH NEEDLE LOCALIZATION;  Surgeon: Edsel Petrin  Dalbert Batman, MD;  Location: [place];  Service: General;  Laterality: Left;    FAMILY HISTORY Family History  Problem Relation Age of Onset  . Hypertension Father   . Dementia Father   . Cancer Neg Hx     Negative for breast or colon cancer  . Heart disease Neg Hx    the patient's father was murdered at the age of  61. The patient's mother died in her sleep at the age of 44. The patient has 3 brothers, 6 sisters. She has one cousin with breast cancer, but does not know at what age she was diagnosed. She also has a cousin with ovarian cancer, but again does not know at what age it was diagnosed.  GYNECOLOGIC HISTORY:  Menarche age 9, first live birth age 24, the patient is [place] P5. She went through the change of life at age 41. She did not use hormone replacement.   SOCIAL HISTORY:  [name] used to work at [employer], but is now retired. Her husband "[name]" is also blind. He suffers from significant Alzheimer's disease. The patient lives with her husband and 2 grandchildren, [name], 42, and 83, 55. The patient's children are [name], who works in [place] in Programmer, applications for a Theatre manager; [name] who works in [place] as a Curator; Surveyor, mining, who works in [place] in Psychologist, educational; [name], who also lives in [place]; and [name], who is a Education officer, environmental. The patient has 6 grandchildren, four great-grandchildren. She attends a local [place]: Not in place; the patient intends to name her daughter [name] as healthcare power of attorney. [name] can be reached at [phone]   HEALTH MAINTENANCE: History  Substance Use Topics  . Smoking status: Never Smoker   . Smokeless tobacco: Not on file  . Alcohol Use: No     Colonoscopy:  PAP:  Bone density: 04/07/2013 at [facility] showed osteopenia with a T score of -2.2 at the left femoral neck  Lipid panel:  No Known Allergies  Current Outpatient Prescriptions  Medication Sig Dispense Refill  . fexofenadine (ALLEGRA) 180 MG tablet Take 1 tablet (180 mg total) by mouth as needed. 30 tablet 11  . hydrochlorothiazide (HYDRODIURIL) 25 MG tablet TAKE 1 TABLET BY MOUTH AS NEEDED 30 tablet 5  . ibuprofen (ADVIL) 200 MG tablet Take 200 mg by mouth as needed.      . pravastatin (PRAVACHOL) 40 MG tablet TAKE 1 TABLET BY  MOUTH AT BEDTIME 30 tablet 0  . ranitidine (ZANTAC) 150 MG tablet Take 150 mg by mouth daily as needed.     . tamoxifen (NOLVADEX) 20 MG tablet Take 1 tablet (20 mg total) by mouth daily. 30 tablet 12   No current facility-administered medications for this visit.    OBJECTIVE: Older African American woman in no acute distress Filed Vitals:   05/01/14 1416  BP: 127/75  Pulse: 77  Temp: 97.8 F (36.6 C)  Resp: 18     Body mass index is 33.37 kg/(m^2).    ECOG FS:0 - Asymptomatic  Patient is blind Oropharynx clear, dentition in good repair No cervical or supraclavicular adenopathy Lungs no rales or rhonchi Heart regular rate and rhythm Abd soft, obese, nontender, positive bowel sounds MSK no focal spinal tenderness, no upper extremity lymphedema Neuro: nonfocal, well oriented, positive affect Breasts: The right breast is unremarkable. The left breast is status post lumpectomy. There is no evidence of local recurrence. The left axilla is benign  LAB RESULTS:  CMP     Component Value Date/Time   NA 142 10/27/2013 0930   NA 140 07/06/2013 1135   K 4.3 10/27/2013 0930   K 4.1 07/06/2013 1135   CL 107 07/06/2013 1135   CO2 23 10/27/2013 0930   CO2 27 07/06/2013 1135   GLUCOSE 117 10/27/2013 0930   GLUCOSE 94 07/06/2013 1135   BUN 13.7 10/27/2013 0930   BUN 16 07/06/2013 1135   CREATININE 1.0 10/27/2013 0930   CREATININE 0.8 07/06/2013 1135   CALCIUM 9.4 10/27/2013 0930   CALCIUM 9.9 07/06/2013 1135   CALCIUM 9.9 12/24/2010 1018   PROT 7.5 10/27/2013 0930   PROT 7.9 07/06/2013 1135   ALBUMIN 3.7 10/27/2013 0930   ALBUMIN 4.2 07/06/2013 1135   AST 19 10/27/2013 0930   AST 23 07/06/2013 1135   ALT 11 10/27/2013 0930   ALT 15 07/06/2013 1135   ALKPHOS 51 10/27/2013 0930   ALKPHOS 75 07/06/2013 1135   BILITOT 0.39 10/27/2013 0930   BILITOT 0.7 07/06/2013 1135   GFRNONAA 62* 05/09/2013 0920   GFRAA 72* 05/09/2013 0920    I No results found for: SPEP  Lab Results   Component Value Date   WBC 4.9 10/27/2013   NEUTROABS 2.6 10/27/2013   HGB 11.5* 10/27/2013   HCT 36.6 10/27/2013   MCV 85.9 10/27/2013   PLT 218 10/27/2013      Chemistry      Component Value Date/Time   NA 142 10/27/2013 0930   NA 140 07/06/2013 1135   K 4.3 10/27/2013 0930   K 4.1 07/06/2013 1135   CL 107 07/06/2013 1135   CO2 23 10/27/2013 0930   CO2 27 07/06/2013 1135   BUN 13.7 10/27/2013 0930   BUN 16 07/06/2013 1135   CREATININE 1.0 10/27/2013 0930   CREATININE 0.8 07/06/2013 1135      Component Value Date/Time   CALCIUM 9.4 10/27/2013 0930   CALCIUM 9.9 07/06/2013 1135   CALCIUM 9.9 12/24/2010 1018   ALKPHOS 51 10/27/2013 0930   ALKPHOS 75 07/06/2013 1135   AST 19 10/27/2013 0930   AST 23 07/06/2013 1135   ALT 11 10/27/2013 0930   ALT 15 07/06/2013 1135   BILITOT 0.39 10/27/2013 0930   BILITOT 0.7 07/06/2013 1135       No results found for: LABCA2  No components found for: LABCA125  No results for input(s): INR in the last 168 hours.  Urinalysis    Component Value Date/Time   COLORURINE YELLOW 07/06/2013 1135   APPEARANCEUR CLEAR 07/06/2013 1135   LABSPEC 1.020 07/06/2013 1135   PHURINE 5.5 07/06/2013 1135   GLUCOSEU NEGATIVE 07/06/2013 1135   GLUCOSEU NEGATIVE 05/09/2013 0848   HGBUR TRACE-LYSED* 07/06/2013 1135   BILIRUBINUR NEGATIVE 07/06/2013 1135   KETONESUR NEGATIVE 07/06/2013 1135   PROTEINUR NEGATIVE 05/09/2013 0848   UROBILINOGEN 0.2 07/06/2013 1135   NITRITE NEGATIVE 07/06/2013 1135   LEUKOCYTESUR NEGATIVE 07/06/2013 1135    STUDIES: Mammography at [facility] January 2016 was unremarkable  ASSESSMENT: 81 y.o. [place] woman status post left breast biopsy 03/28/2013 for a clinical T1c N0, stage IA invasive (lobular) carcinoma, grade 1, estrogen and progesterone receptor positive, HER-2 negative, with an MIB-1 of 6%  (1) a 1.3 cm lesion noted by MRI in the right breast was proven benign by biopsy 04/20/2013  (2) status post left  lumpectomy without salpingo-oophorectomy 05/11/2013 for a pT1c cN0, stage IA invasive ductal carcinoma, grade 1, repeat HER-2 again negative, with ample margins  (  3) the patient was evaluated by radiation oncology, who felt if she took anti-estrogens it would be safe for her to forego radiation  (4) tamoxifen started April 2015  (5) osteopenia, T score of -2.25 March 2013   PLAN: Kennya is doing fine from a breast cancer point of view, now a year out from her definitive surgery. There is no evidence of disease recurrence or activity. She is tolerating tamoxifen well.  We will continue tamoxifen at least another year. At that point we can consider switching to an aromatase inhibitor depending on what her bone density shows her that we'll be performed January 2017. \ She will see Dr. Dalbert Batman around August of this year. She will return to see [place] a year from now. She knows to call for any problems that may develop before that visit.   Chauncey Cruel, MD   05/01/2014 2:26 PM

## 2014-05-11 ENCOUNTER — Telehealth: Payer: Self-pay | Admitting: Gastroenterology

## 2014-05-11 NOTE — Telephone Encounter (Signed)
Patient notified of recommendations. She will call back when she decides what to do.

## 2014-05-11 NOTE — Telephone Encounter (Signed)
Spoke with patient and she does not have a preference for new GI MD. She is not having any GI problems. She is asking if she needs to have another colonoscopy. Last colon, 10/13/07, tubular adenoma.DOD: Please, advise.

## 2014-05-11 NOTE — Telephone Encounter (Signed)
She had a precancerous polyp in 2009. She would be due for routine follow-up around 2014. She is 80. If she is medically fit and motivated, surveillance colonoscopy would be reasonable at this time. If not, follow-up as needed also be reasonable.

## 2014-06-20 DIAGNOSIS — Z01419 Encounter for gynecological examination (general) (routine) without abnormal findings: Secondary | ICD-10-CM | POA: Diagnosis not present

## 2014-06-21 ENCOUNTER — Ambulatory Visit (INDEPENDENT_AMBULATORY_CARE_PROVIDER_SITE_OTHER): Payer: Medicare Other | Admitting: Podiatry

## 2014-06-21 ENCOUNTER — Encounter: Payer: Self-pay | Admitting: Podiatry

## 2014-06-21 VITALS — BP 106/62 | HR 84 | Resp 18

## 2014-06-21 DIAGNOSIS — M79676 Pain in unspecified toe(s): Secondary | ICD-10-CM | POA: Diagnosis not present

## 2014-06-21 DIAGNOSIS — B351 Tinea unguium: Secondary | ICD-10-CM | POA: Diagnosis not present

## 2014-06-21 NOTE — Progress Notes (Signed)
Patient ID: [name], female   DOB: 05/17/1933, 81 y.o.   MRN: [id number]  Subjective: 81 y.o.-year-old female returns the office today for painful, elongated, thickened toenails which she is unable to trim herself. Denies any redness or drainage around the nails. Denies any acute changes since last appointment and no new complaints today. Denies any systemic complaints such as fevers, chills, nausea, vomiting.   Objective: AAO 3, NAD DP/PT pulses palpable, CRT less than 3 seconds Protective sensation intact with Simms Weinstein monofilament, Achilles tendon reflex intact.  Nails hypertrophic, dystrophic, elongated, brittle, discolored 10. There is tenderness overlying the nails 1-5 bilaterally. There is no surrounding erythema or drainage along the nail sites. No open lesions or pre-ulcerative lesions are identified. There is prominence of the metatarsal heads plantarly with atrophy of the fat pad. There is no areas sitting tenderness to palpation or pain with vibratory sensation. No overlying edema, erythema, increased warmth to bilateral lower extremities.  No pain with calf compression, swelling, warmth, erythema.  Assessment: Patient presents with symptomatic onychomycosis; metatarsalgia bilaterally  Plan: -Treatment options including alternatives, risks, complications were discussed -Nails sharply debrided 10 without complication/bleeding. -Offloading pads that I dispensed last one helped some but not enough. I recommended a gel insert to help pad the area or orthotics. -Discussed daily foot inspection. If there are any changes, to call the office immediately.  -Follow-up in 3 months or sooner if any problems are to arise. In the meantime, encouraged to call the office with any questions, concerns, changes symptoms.

## 2014-07-04 ENCOUNTER — Encounter: Payer: Self-pay | Admitting: Internal Medicine

## 2014-07-04 ENCOUNTER — Ambulatory Visit (INDEPENDENT_AMBULATORY_CARE_PROVIDER_SITE_OTHER): Payer: Medicare Other | Admitting: Internal Medicine

## 2014-07-04 ENCOUNTER — Other Ambulatory Visit (INDEPENDENT_AMBULATORY_CARE_PROVIDER_SITE_OTHER): Payer: Medicare Other

## 2014-07-04 VITALS — BP 136/72 | HR 76 | Temp 98.2°F | Resp 16 | Wt 182.0 lb

## 2014-07-04 DIAGNOSIS — Z23 Encounter for immunization: Secondary | ICD-10-CM

## 2014-07-04 DIAGNOSIS — K219 Gastro-esophageal reflux disease without esophagitis: Secondary | ICD-10-CM

## 2014-07-04 DIAGNOSIS — E785 Hyperlipidemia, unspecified: Secondary | ICD-10-CM | POA: Diagnosis not present

## 2014-07-04 DIAGNOSIS — R739 Hyperglycemia, unspecified: Secondary | ICD-10-CM

## 2014-07-04 DIAGNOSIS — R0609 Other forms of dyspnea: Secondary | ICD-10-CM | POA: Diagnosis not present

## 2014-07-04 DIAGNOSIS — R011 Cardiac murmur, unspecified: Secondary | ICD-10-CM

## 2014-07-04 DIAGNOSIS — Z0000 Encounter for general adult medical examination without abnormal findings: Secondary | ICD-10-CM | POA: Diagnosis not present

## 2014-07-04 LAB — LIPID PANEL
CHOL/HDL RATIO: 3
Cholesterol: 176 mg/dL (ref 0–200)
HDL: 59.6 mg/dL (ref >39.00)
LDL Cholesterol: 93 mg/dL (ref 0–99)
NonHDL: 116.4
TRIGLYCERIDES: 115 mg/dL (ref 0.0–149.0)
VLDL: 23 mg/dL (ref 0.0–40.0)

## 2014-07-04 LAB — TSH: TSH: 1.94 u[IU]/mL (ref 0.35–4.50)

## 2014-07-04 NOTE — Assessment & Plan Note (Signed)
She appears to have the murmur of AS Will reorder the ECHO to find out more info about this

## 2014-07-04 NOTE — Progress Notes (Signed)
Pre visit review using our clinic review tool, if applicable. No additional management support is needed unless otherwise documented below in the visit note. 

## 2014-07-04 NOTE — Patient Instructions (Signed)
Preventive Care for Adults A healthy lifestyle and preventive care can promote health and wellness. Preventive health guidelines for women include the following key practices.  A routine yearly physical is a good way to check with your health care provider about your health and preventive screening. It is a chance to share any concerns and updates on your health and to receive a thorough exam.  Visit your dentist for a routine exam and preventive care every 6 months. Brush your teeth twice a day and floss once a day. Good oral hygiene prevents tooth decay and gum disease.  The frequency of eye exams is based on your age, health, family medical history, use of contact lenses, and other factors. Follow your health care provider's recommendations for frequency of eye exams.  Eat a healthy diet. Foods like vegetables, fruits, whole grains, low-fat dairy products, and lean protein foods contain the nutrients you need without too many calories. Decrease your intake of foods high in solid fats, added sugars, and salt. Eat the right amount of calories for you.Get information about a proper diet from your health care provider, if necessary.  Regular physical exercise is one of the most important things you can do for your health. Most adults should get at least 150 minutes of moderate-intensity exercise (any activity that increases your heart rate and causes you to sweat) each week. In addition, most adults need muscle-strengthening exercises on 2 or more days a week.  Maintain a healthy weight. The body mass index (BMI) is a screening tool to identify possible weight problems. It provides an estimate of body fat based on height and weight. Your health care provider can find your BMI and can help you achieve or maintain a healthy weight.For adults 20 years and older:  A BMI below 18.5 is considered underweight.  A BMI of 18.5 to 24.9 is normal.  A BMI of 25 to 29.9 is considered overweight.  A BMI of  30 and above is considered obese.  Maintain normal blood lipids and cholesterol levels by exercising and minimizing your intake of saturated fat. Eat a balanced diet with plenty of fruit and vegetables. Blood tests for lipids and cholesterol should begin at age 76 and be repeated every 5 years. If your lipid or cholesterol levels are high, you are over 50, or you are at high risk for heart disease, you may need your cholesterol levels checked more frequently.Ongoing high lipid and cholesterol levels should be treated with medicines if diet and exercise are not working.  If you smoke, find out from your health care provider how to quit. If you do not use tobacco, do not start.  Lung cancer screening is recommended for adults aged 22-80 years who are at high risk for developing lung cancer because of a history of smoking. A yearly low-dose CT scan of the lungs is recommended for people who have at least a 30-pack-year history of smoking and are a current smoker or have quit within the past 15 years. A pack year of smoking is smoking an average of 1 pack of cigarettes a day for 1 year (for example: 1 pack a day for 30 years or 2 packs a day for 15 years). Yearly screening should continue until the smoker has stopped smoking for at least 15 years. Yearly screening should be stopped for people who develop a health problem that would prevent them from having lung cancer treatment.  If you are pregnant, do not drink alcohol. If you are breastfeeding,  be very cautious about drinking alcohol. If you are not pregnant and choose to drink alcohol, do not have more than 1 drink per day. One drink is considered to be 12 ounces (355 mL) of beer, 5 ounces (148 mL) of wine, or 1.5 ounces (44 mL) of liquor.  Avoid use of street drugs. Do not share needles with anyone. Ask for help if you need support or instructions about stopping the use of drugs.  High blood pressure causes heart disease and increases the risk of  stroke. Your blood pressure should be checked at least every 1 to 2 years. Ongoing high blood pressure should be treated with medicines if weight loss and exercise do not work.  If you are 75-52 years old, ask your health care provider if you should take aspirin to prevent strokes.  Diabetes screening involves taking a blood sample to check your fasting blood sugar level. This should be done once every 3 years, after age 15, if you are within normal weight and without risk factors for diabetes. Testing should be considered at a younger age or be carried out more frequently if you are overweight and have at least 1 risk factor for diabetes.  Breast cancer screening is essential preventive care for women. You should practice "breast self-awareness." This means understanding the normal appearance and feel of your breasts and may include breast self-examination. Any changes detected, no matter how small, should be reported to a health care provider. Women in their 58s and 30s should have a clinical breast exam (CBE) by a health care provider as part of a regular health exam every 1 to 3 years. After age 16, women should have a CBE every year. Starting at age 53, women should consider having a mammogram (breast X-ray test) every year. Women who have a family history of breast cancer should talk to their health care provider about genetic screening. Women at a high risk of breast cancer should talk to their health care providers about having an MRI and a mammogram every year.  Breast cancer gene (BRCA)-related cancer risk assessment is recommended for women who have family members with BRCA-related cancers. BRCA-related cancers include breast, ovarian, tubal, and peritoneal cancers. Having family members with these cancers may be associated with an increased risk for harmful changes (mutations) in the breast cancer genes BRCA1 and BRCA2. Results of the assessment will determine the need for genetic counseling and  BRCA1 and BRCA2 testing.  Routine pelvic exams to screen for cancer are no longer recommended for nonpregnant women who are considered low risk for cancer of the pelvic organs (ovaries, uterus, and vagina) and who do not have symptoms. Ask your health care provider if a screening pelvic exam is right for you.  If you have had past treatment for cervical cancer or a condition that could lead to cancer, you need Pap tests and screening for cancer for at least 20 years after your treatment. If Pap tests have been discontinued, your risk factors (such as having a new sexual partner) need to be reassessed to determine if screening should be resumed. Some women have medical problems that increase the chance of getting cervical cancer. In these cases, your health care provider may recommend more frequent screening and Pap tests.  The HPV test is an additional test that may be used for cervical cancer screening. The HPV test looks for the virus that can cause the cell changes on the cervix. The cells collected during the Pap test can be  tested for HPV. The HPV test could be used to screen women aged 30 years and older, and should be used in women of any age who have unclear Pap test results. After the age of 30, women should have HPV testing at the same frequency as a Pap test.  Colorectal cancer can be detected and often prevented. Most routine colorectal cancer screening begins at the age of 50 years and continues through age 75 years. However, your health care provider may recommend screening at an earlier age if you have risk factors for colon cancer. On a yearly basis, your health care provider may provide home test kits to check for hidden blood in the stool. Use of a small camera at the end of a tube, to directly examine the colon (sigmoidoscopy or colonoscopy), can detect the earliest forms of colorectal cancer. Talk to your health care provider about this at age 50, when routine screening begins. Direct  exam of the colon should be repeated every 5-10 years through age 75 years, unless early forms of pre-cancerous polyps or small growths are found.  People who are at an increased risk for hepatitis B should be screened for this virus. You are considered at high risk for hepatitis B if:  You were born in a country where hepatitis B occurs often. Talk with your health care provider about which countries are considered high risk.  Your parents were born in a high-risk country and you have not received a shot to protect against hepatitis B (hepatitis B vaccine).  You have HIV or AIDS.  You use needles to inject street drugs.  You live with, or have sex with, someone who has hepatitis B.  You get hemodialysis treatment.  You take certain medicines for conditions like cancer, organ transplantation, and autoimmune conditions.  Hepatitis C blood testing is recommended for all people born from 1945 through 1965 and any individual with known risks for hepatitis C.  Practice safe sex. Use condoms and avoid high-risk sexual practices to reduce the spread of sexually transmitted infections (STIs). STIs include gonorrhea, chlamydia, syphilis, trichomonas, herpes, HPV, and human immunodeficiency virus (HIV). Herpes, HIV, and HPV are viral illnesses that have no cure. They can result in disability, cancer, and death.  You should be screened for sexually transmitted illnesses (STIs) including gonorrhea and chlamydia if:  You are sexually active and are younger than 24 years.  You are older than 24 years and your health care provider tells you that you are at risk for this type of infection.  Your sexual activity has changed since you were last screened and you are at an increased risk for chlamydia or gonorrhea. Ask your health care provider if you are at risk.  If you are at risk of being infected with HIV, it is recommended that you take a prescription medicine daily to prevent HIV infection. This is  called preexposure prophylaxis (PrEP). You are considered at risk if:  You are a heterosexual woman, are sexually active, and are at increased risk for HIV infection.  You take drugs by injection.  You are sexually active with a partner who has HIV.  Talk with your health care provider about whether you are at high risk of being infected with HIV. If you choose to begin PrEP, you should first be tested for HIV. You should then be tested every 3 months for as long as you are taking PrEP.  Osteoporosis is a disease in which the bones lose minerals and strength   with aging. This can result in serious bone fractures or breaks. The risk of osteoporosis can be identified using a bone density scan. Women ages 65 years and over and women at risk for fractures or osteoporosis should discuss screening with their health care providers. Ask your health care provider whether you should take a calcium supplement or vitamin D to reduce the rate of osteoporosis.  Menopause can be associated with physical symptoms and risks. Hormone replacement therapy is available to decrease symptoms and risks. You should talk to your health care provider about whether hormone replacement therapy is right for you.  Use sunscreen. Apply sunscreen liberally and repeatedly throughout the day. You should seek shade when your shadow is shorter than you. Protect yourself by wearing long sleeves, pants, a wide-brimmed hat, and sunglasses year round, whenever you are outdoors.  Once a month, do a whole body skin exam, using a mirror to look at the skin on your back. Tell your health care provider of new moles, moles that have irregular borders, moles that are larger than a pencil eraser, or moles that have changed in shape or color.  Stay current with required vaccines (immunizations).  Influenza vaccine. All adults should be immunized every year.  Tetanus, diphtheria, and acellular pertussis (Td, Tdap) vaccine. Pregnant women should  receive 1 dose of Tdap vaccine during each pregnancy. The dose should be obtained regardless of the length of time since the last dose. Immunization is preferred during the 27th-36th week of gestation. An adult who has not previously received Tdap or who does not know her vaccine status should receive 1 dose of Tdap. This initial dose should be followed by tetanus and diphtheria toxoids (Td) booster doses every 10 years. Adults with an unknown or incomplete history of completing a 3-dose immunization series with Td-containing vaccines should begin or complete a primary immunization series including a Tdap dose. Adults should receive a Td booster every 10 years.  Varicella vaccine. An adult without evidence of immunity to varicella should receive 2 doses or a second dose if she has previously received 1 dose. Pregnant females who do not have evidence of immunity should receive the first dose after pregnancy. This first dose should be obtained before leaving the health care facility. The second dose should be obtained 4-8 weeks after the first dose.  Human papillomavirus (HPV) vaccine. Females aged 13-26 years who have not received the vaccine previously should obtain the 3-dose series. The vaccine is not recommended for use in pregnant females. However, pregnancy testing is not needed before receiving a dose. If a female is found to be pregnant after receiving a dose, no treatment is needed. In that case, the remaining doses should be delayed until after the pregnancy. Immunization is recommended for any person with an immunocompromised condition through the age of 26 years if she did not get any or all doses earlier. During the 3-dose series, the second dose should be obtained 4-8 weeks after the first dose. The third dose should be obtained 24 weeks after the first dose and 16 weeks after the second dose.  Zoster vaccine. One dose is recommended for adults aged 60 years or older unless certain conditions are  present.  Measles, mumps, and rubella (MMR) vaccine. Adults born before 1957 generally are considered immune to measles and mumps. Adults born in 1957 or later should have 1 or more doses of MMR vaccine unless there is a contraindication to the vaccine or there is laboratory evidence of immunity to   each of the three diseases. A routine second dose of MMR vaccine should be obtained at least 28 days after the first dose for students attending postsecondary schools, health care workers, or international travelers. People who received inactivated measles vaccine or an unknown type of measles vaccine during 1963-1967 should receive 2 doses of MMR vaccine. People who received inactivated mumps vaccine or an unknown type of mumps vaccine before 1979 and are at high risk for mumps infection should consider immunization with 2 doses of MMR vaccine. For females of childbearing age, rubella immunity should be determined. If there is no evidence of immunity, females who are not pregnant should be vaccinated. If there is no evidence of immunity, females who are pregnant should delay immunization until after pregnancy. Unvaccinated health care workers born before 1957 who lack laboratory evidence of measles, mumps, or rubella immunity or laboratory confirmation of disease should consider measles and mumps immunization with 2 doses of MMR vaccine or rubella immunization with 1 dose of MMR vaccine.  Pneumococcal 13-valent conjugate (PCV13) vaccine. When indicated, a person who is uncertain of her immunization history and has no record of immunization should receive the PCV13 vaccine. An adult aged 19 years or older who has certain medical conditions and has not been previously immunized should receive 1 dose of PCV13 vaccine. This PCV13 should be followed with a dose of pneumococcal polysaccharide (PPSV23) vaccine. The PPSV23 vaccine dose should be obtained at least 8 weeks after the dose of PCV13 vaccine. An adult aged 19  years or older who has certain medical conditions and previously received 1 or more doses of PPSV23 vaccine should receive 1 dose of PCV13. The PCV13 vaccine dose should be obtained 1 or more years after the last PPSV23 vaccine dose.  Pneumococcal polysaccharide (PPSV23) vaccine. When PCV13 is also indicated, PCV13 should be obtained first. All adults aged 65 years and older should be immunized. An adult younger than age 65 years who has certain medical conditions should be immunized. Any person who resides in a nursing home or long-term care facility should be immunized. An adult smoker should be immunized. People with an immunocompromised condition and certain other conditions should receive both PCV13 and PPSV23 vaccines. People with human immunodeficiency virus (HIV) infection should be immunized as soon as possible after diagnosis. Immunization during chemotherapy or radiation therapy should be avoided. Routine use of PPSV23 vaccine is not recommended for American Indians, [place] Natives, or people younger than 65 years unless there are medical conditions that require PPSV23 vaccine. When indicated, people who have unknown immunization and have no record of immunization should receive PPSV23 vaccine. One-time revaccination 5 years after the first dose of PPSV23 is recommended for people aged 19-64 years who have chronic kidney failure, nephrotic syndrome, asplenia, or immunocompromised conditions. People who received 1-2 doses of PPSV23 before age 65 years should receive another dose of PPSV23 vaccine at age 65 years or later if at least 5 years have passed since the previous dose. Doses of PPSV23 are not needed for people immunized with PPSV23 at or after age 65 years.  Meningococcal vaccine. Adults with asplenia or persistent complement component deficiencies should receive 2 doses of quadrivalent meningococcal conjugate (MenACWY-D) vaccine. The doses should be obtained at least 2 months apart.  Microbiologists working with certain meningococcal bacteria, military recruits, people at risk during an outbreak, and people who travel to or live in countries with a high rate of meningitis should be immunized. A first-year college student up through age   21 years who is living in a residence hall should receive a dose if she did not receive a dose on or after her 16th birthday. Adults who have certain high-risk conditions should receive one or more doses of vaccine.  Hepatitis A vaccine. Adults who wish to be protected from this disease, have certain high-risk conditions, work with hepatitis A-infected animals, work in hepatitis A research labs, or travel to or work in countries with a high rate of hepatitis A should be immunized. Adults who were previously unvaccinated and who anticipate close contact with an international adoptee during the first 60 days after arrival in the [place] from a country with a high rate of hepatitis A should be immunized.  Hepatitis B vaccine. Adults who wish to be protected from this disease, have certain high-risk conditions, may be exposed to blood or other infectious body fluids, are household contacts or sex partners of hepatitis B positive people, are clients or workers in certain care facilities, or travel to or work in countries with a high rate of hepatitis B should be immunized.  Haemophilus influenzae type b (Hib) vaccine. A previously unvaccinated person with asplenia or sickle cell disease or having a scheduled splenectomy should receive 1 dose of Hib vaccine. Regardless of previous immunization, a recipient of a hematopoietic stem cell transplant should receive a 3-dose series 6-12 months after her successful transplant. Hib vaccine is not recommended for adults with HIV infection. Preventive Services / Frequency Ages 64 to 68 years  Blood pressure check.** / Every 1 to 2 years.  Lipid and cholesterol check.** / Every 5 years beginning at age  22.  Clinical breast exam.** / Every 3 years for women in their 88s and 53s.  BRCA-related cancer risk assessment.** / For women who have family members with a BRCA-related cancer (breast, ovarian, tubal, or peritoneal cancers).  Pap test.** / Every 2 years from ages 90 through 51. Every 3 years starting at age 21 through age 56 or 3 with a history of 3 consecutive normal Pap tests.  HPV screening.** / Every 3 years from ages 24 through ages 1 to 46 with a history of 3 consecutive normal Pap tests.  Hepatitis C blood test.** / For any individual with known risks for hepatitis C.  Skin self-exam. / Monthly.  Influenza vaccine. / Every year.  Tetanus, diphtheria, and acellular pertussis (Tdap, Td) vaccine.** / Consult your health care provider. Pregnant women should receive 1 dose of Tdap vaccine during each pregnancy. 1 dose of Td every 10 years.  Varicella vaccine.** / Consult your health care provider. Pregnant females who do not have evidence of immunity should receive the first dose after pregnancy.  HPV vaccine. / 3 doses over 6 months, if 72 and younger. The vaccine is not recommended for use in pregnant females. However, pregnancy testing is not needed before receiving a dose.  Measles, mumps, rubella (MMR) vaccine.** / You need at least 1 dose of MMR if you were born in 1957 or later. You may also need a 2nd dose. For females of childbearing age, rubella immunity should be determined. If there is no evidence of immunity, females who are not pregnant should be vaccinated. If there is no evidence of immunity, females who are pregnant should delay immunization until after pregnancy.  Pneumococcal 13-valent conjugate (PCV13) vaccine.** / Consult your health care provider.  Pneumococcal polysaccharide (PPSV23) vaccine.** / 1 to 2 doses if you smoke cigarettes or if you have certain conditions.  Meningococcal vaccine.** /  1 dose if you are age 19 to 21 years and a first-year college  student living in a residence hall, or have one of several medical conditions, you need to get vaccinated against meningococcal disease. You may also need additional booster doses.  Hepatitis A vaccine.** / Consult your health care provider.  Hepatitis B vaccine.** / Consult your health care provider.  Haemophilus influenzae type b (Hib) vaccine.** / Consult your health care provider. Ages 40 to 64 years  Blood pressure check.** / Every 1 to 2 years.  Lipid and cholesterol check.** / Every 5 years beginning at age 20 years.  Lung cancer screening. / Every year if you are aged 55-80 years and have a 30-pack-year history of smoking and currently smoke or have quit within the past 15 years. Yearly screening is stopped once you have quit smoking for at least 15 years or develop a health problem that would prevent you from having lung cancer treatment.  Clinical breast exam.** / Every year after age 40 years.  BRCA-related cancer risk assessment.** / For women who have family members with a BRCA-related cancer (breast, ovarian, tubal, or peritoneal cancers).  Mammogram.** / Every year beginning at age 40 years and continuing for as long as you are in good health. Consult with your health care provider.  Pap test.** / Every 3 years starting at age 30 years through age 65 or 70 years with a history of 3 consecutive normal Pap tests.  HPV screening.** / Every 3 years from ages 30 years through ages 65 to 70 years with a history of 3 consecutive normal Pap tests.  Fecal occult blood test (FOBT) of stool. / Every year beginning at age 50 years and continuing until age 75 years. You may not need to do this test if you get a colonoscopy every 10 years.  Flexible sigmoidoscopy or colonoscopy.** / Every 5 years for a flexible sigmoidoscopy or every 10 years for a colonoscopy beginning at age 50 years and continuing until age 75 years.  Hepatitis C blood test.** / For all people born from 1945 through  1965 and any individual with known risks for hepatitis C.  Skin self-exam. / Monthly.  Influenza vaccine. / Every year.  Tetanus, diphtheria, and acellular pertussis (Tdap/Td) vaccine.** / Consult your health care provider. Pregnant women should receive 1 dose of Tdap vaccine during each pregnancy. 1 dose of Td every 10 years.  Varicella vaccine.** / Consult your health care provider. Pregnant females who do not have evidence of immunity should receive the first dose after pregnancy.  Zoster vaccine.** / 1 dose for adults aged 60 years or older.  Measles, mumps, rubella (MMR) vaccine.** / You need at least 1 dose of MMR if you were born in 1957 or later. You may also need a 2nd dose. For females of childbearing age, rubella immunity should be determined. If there is no evidence of immunity, females who are not pregnant should be vaccinated. If there is no evidence of immunity, females who are pregnant should delay immunization until after pregnancy.  Pneumococcal 13-valent conjugate (PCV13) vaccine.** / Consult your health care provider.  Pneumococcal polysaccharide (PPSV23) vaccine.** / 1 to 2 doses if you smoke cigarettes or if you have certain conditions.  Meningococcal vaccine.** / Consult your health care provider.  Hepatitis A vaccine.** / Consult your health care provider.  Hepatitis B vaccine.** / Consult your health care provider.  Haemophilus influenzae type b (Hib) vaccine.** / Consult your health care provider. Ages 65   years and over  Blood pressure check.** / Every 1 to 2 years.  Lipid and cholesterol check.** / Every 5 years beginning at age 22 years.  Lung cancer screening. / Every year if you are aged 73-80 years and have a 30-pack-year history of smoking and currently smoke or have quit within the past 15 years. Yearly screening is stopped once you have quit smoking for at least 15 years or develop a health problem that would prevent you from having lung cancer  treatment.  Clinical breast exam.** / Every year after age 4 years.  BRCA-related cancer risk assessment.** / For women who have family members with a BRCA-related cancer (breast, ovarian, tubal, or peritoneal cancers).  Mammogram.** / Every year beginning at age 40 years and continuing for as long as you are in good health. Consult with your health care provider.  Pap test.** / Every 3 years starting at age 9 years through age 34 or 91 years with 3 consecutive normal Pap tests. Testing can be stopped between 65 and 70 years with 3 consecutive normal Pap tests and no abnormal Pap or HPV tests in the past 10 years.  HPV screening.** / Every 3 years from ages 57 years through ages 64 or 45 years with a history of 3 consecutive normal Pap tests. Testing can be stopped between 65 and 70 years with 3 consecutive normal Pap tests and no abnormal Pap or HPV tests in the past 10 years.  Fecal occult blood test (FOBT) of stool. / Every year beginning at age 15 years and continuing until age 17 years. You may not need to do this test if you get a colonoscopy every 10 years.  Flexible sigmoidoscopy or colonoscopy.** / Every 5 years for a flexible sigmoidoscopy or every 10 years for a colonoscopy beginning at age 86 years and continuing until age 71 years.  Hepatitis C blood test.** / For all people born from [birth year] through 1963 and any individual with known risks for hepatitis C.  Osteoporosis screening.** / A one-time screening for women ages 83 years and over and women at risk for fractures or osteoporosis.  Skin self-exam. / Monthly.  Influenza vaccine. / Every year.  Tetanus, diphtheria, and acellular pertussis (Tdap/Td) vaccine.** / 1 dose of Td every 10 years.  Varicella vaccine.** / Consult your health care provider.  Zoster vaccine.** / 1 dose for adults aged 61 years or older.  Pneumococcal 13-valent conjugate (PCV13) vaccine.** / Consult your health care provider.  Pneumococcal  polysaccharide (PPSV23) vaccine.** / 1 dose for all adults aged 28 years and older.  Meningococcal vaccine.** / Consult your health care provider.  Hepatitis A vaccine.** / Consult your health care provider.  Hepatitis B vaccine.** / Consult your health care provider.  Haemophilus influenzae type b (Hib) vaccine.** / Consult your health care provider. ** Family history and personal history of risk and conditions may change your health care provider's recommendations. Document Released: 05/06/2001 Document Revised: 07/25/2013 Document Reviewed: 08/05/2010 [facility] Patient Information 2015 [place], [place]. This information is not intended to replace advice given to you by your health care provider. Make sure you discuss any questions you have with your health care provider.

## 2014-07-04 NOTE — Progress Notes (Signed)
Subjective:    Patient ID: [name], female    DOB: [birth year], 81 y.o.   MRN: [id number]  Hyperlipidemia This is a chronic problem. The current episode started more than 1 year ago. The problem is controlled. Recent lipid tests were reviewed and are variable. She has no history of chronic renal disease, diabetes, hypothyroidism, liver disease, obesity or nephrotic syndrome. There are no known factors aggravating her hyperlipidemia. Associated symptoms include shortness of breath. Pertinent negatives include no chest pain, focal sensory loss, focal weakness, leg pain or myalgias. Current antihyperlipidemic treatment includes statins. The current treatment provides moderate improvement of lipids. There are no compliance problems.       Review of Systems  Constitutional: Positive for fatigue. Negative for fever, chills, diaphoresis, activity change, appetite change and unexpected weight change.  HENT: Negative.  Negative for trouble swallowing.   Eyes: Negative.   Respiratory: Positive for shortness of breath. Negative for apnea, cough, choking, chest tightness, wheezing and stridor.        She complains of intermittent SOB at rest and with exertion for over one year, when I saw her a year ago I ordered an ECHO but this was never done  Cardiovascular: Negative.  Negative for chest pain, palpitations and leg swelling.  Gastrointestinal: Negative.  Negative for nausea, vomiting, abdominal pain, diarrhea, constipation and blood in stool.  Endocrine: Negative.   Genitourinary: Negative.   Musculoskeletal: Negative.  Negative for myalgias, back pain, joint swelling, arthralgias and neck stiffness.  Skin: Negative.   Allergic/Immunologic: Negative.   Neurological: Negative.  Negative for dizziness, focal weakness, syncope, speech difficulty, weakness, light-headedness, numbness and headaches.  Hematological: Negative.  Negative for adenopathy. Does not bruise/bleed easily.    Psychiatric/Behavioral: Negative.        Objective:   Physical Exam  Constitutional: She is oriented to person, place, and time. She appears well-developed and well-nourished. No distress.  HENT:  Head: Normocephalic and atraumatic.  Nose: Nose normal.  Mouth/Throat: Oropharynx is clear and moist. No oropharyngeal exudate.  Eyes: Conjunctivae are normal. Right eye exhibits no discharge. Left eye exhibits no discharge. No scleral icterus.  Neck: Normal range of motion. Neck supple. No JVD present. No tracheal deviation present. No thyromegaly present.  Cardiovascular: Normal rate, regular rhythm, S1 normal, S2 normal and intact distal pulses.  Exam reveals no gallop, no S3, no S4 and no friction rub.   Murmur heard.  Decrescendo systolic murmur is present with a grade of 1/6   No diastolic murmur is present  Pulmonary/Chest: Effort normal and breath sounds normal. No stridor. No respiratory distress. She has no wheezes. She has no rales. She exhibits no tenderness.  Abdominal: Soft. Bowel sounds are normal. She exhibits no distension and no mass. There is no tenderness. There is no rebound and no guarding.  Musculoskeletal: Normal range of motion. She exhibits no edema or tenderness.  Lymphadenopathy:    She has no cervical adenopathy.  Neurological: She is oriented to person, place, and time.  Skin: Skin is warm and dry. No rash noted. She is not diaphoretic. No erythema. No pallor.  Psychiatric: She has a normal mood and affect. Her behavior is normal. Judgment and thought content normal.      Lab Results  Component Value Date   WBC 6.7 05/01/2014   HGB 12.0 05/01/2014   HCT 37.9 05/01/2014   PLT 235 05/01/2014   GLUCOSE 96 05/01/2014   CHOL 193 07/06/2013   TRIG 95.0 07/06/2013  HDL 61.40 07/06/2013   LDLDIRECT 133.2 09/04/2009   LDLCALC 113* 07/06/2013   ALT 14 05/01/2014   AST 20 05/01/2014   NA 142 05/01/2014   K 3.8 05/01/2014   CL 107 07/06/2013   CREATININE  0.9 05/01/2014   BUN 14.2 05/01/2014   CO2 23 05/01/2014   TSH 1.66 07/06/2013   HGBA1C 6.2 07/06/2013      Assessment & Plan:

## 2014-07-04 NOTE — Assessment & Plan Note (Signed)

## 2014-07-04 NOTE — Assessment & Plan Note (Signed)
Her EKG shows artifact and there is poor RWP and diffuse T wave flattening and inversion but no acute changes, no Q waves Will get an ECHO done to check for valvular disease and to screen for wall motion abnormalities

## 2014-07-04 NOTE — Assessment & Plan Note (Signed)
She has achieved her LDL goal and is doing well on the statin

## 2014-07-19 ENCOUNTER — Ambulatory Visit (HOSPITAL_COMMUNITY): Payer: Medicare Other | Attending: Cardiology | Admitting: Radiology

## 2014-07-19 DIAGNOSIS — R011 Cardiac murmur, unspecified: Secondary | ICD-10-CM | POA: Diagnosis not present

## 2014-07-19 NOTE — Progress Notes (Signed)
Echocardiogram performed.  

## 2014-07-20 ENCOUNTER — Encounter: Payer: Self-pay | Admitting: Internal Medicine

## 2014-08-29 ENCOUNTER — Other Ambulatory Visit: Payer: Self-pay

## 2014-08-29 MED ORDER — PRAVASTATIN SODIUM 40 MG PO TABS: ORAL_TABLET | ORAL | Status: DC

## 2014-08-29 NOTE — Telephone Encounter (Signed)
Pt called and wants to know more detail about the heart test.  I told her that DR jones sent message on mychart  Best number [phone]

## 2014-09-22 ENCOUNTER — Ambulatory Visit: Payer: Medicare Other

## 2014-09-28 ENCOUNTER — Encounter: Payer: Self-pay | Admitting: Gastroenterology

## 2014-10-10 ENCOUNTER — Ambulatory Visit: Payer: Medicare Other | Admitting: Podiatry

## 2015-04-20 DIAGNOSIS — M85852 Other specified disorders of bone density and structure, left thigh: Secondary | ICD-10-CM | POA: Diagnosis not present

## 2015-04-20 DIAGNOSIS — Z853 Personal history of malignant neoplasm of breast: Secondary | ICD-10-CM | POA: Diagnosis not present

## 2015-04-20 DIAGNOSIS — R928 Other abnormal and inconclusive findings on diagnostic imaging of breast: Secondary | ICD-10-CM | POA: Diagnosis not present

## 2015-04-20 LAB — HM DEXA SCAN

## 2015-04-20 LAB — HM MAMMOGRAPHY

## 2015-04-21 ENCOUNTER — Encounter: Payer: Self-pay | Admitting: Internal Medicine

## 2015-04-24 ENCOUNTER — Encounter: Payer: Self-pay | Admitting: Internal Medicine

## 2015-05-02 ENCOUNTER — Telehealth: Payer: Self-pay | Admitting: Oncology

## 2015-05-02 NOTE — Telephone Encounter (Signed)
Returned patient call re f/u with GM. Obtained pof from last office visit 05/01/14 and scheduled patient for annual lab and f/u 05/09/15 @ 9:30 am - patient requested lab/GM same day.

## 2015-05-02 NOTE — Telephone Encounter (Signed)
Per patient she already had bone density at [facility].

## 2015-05-03 ENCOUNTER — Encounter: Payer: Self-pay | Admitting: Internal Medicine

## 2015-05-03 ENCOUNTER — Other Ambulatory Visit: Payer: Self-pay | Admitting: Oncology

## 2015-05-03 NOTE — Telephone Encounter (Signed)
Chart reviewed.

## 2015-05-08 ENCOUNTER — Other Ambulatory Visit: Payer: Self-pay

## 2015-05-08 DIAGNOSIS — C50312 Malignant neoplasm of lower-inner quadrant of left female breast: Secondary | ICD-10-CM

## 2015-05-09 ENCOUNTER — Ambulatory Visit (HOSPITAL_BASED_OUTPATIENT_CLINIC_OR_DEPARTMENT_OTHER): Payer: Medicare Other | Admitting: Oncology

## 2015-05-09 ENCOUNTER — Telehealth: Payer: Self-pay | Admitting: Oncology

## 2015-05-09 ENCOUNTER — Other Ambulatory Visit (HOSPITAL_BASED_OUTPATIENT_CLINIC_OR_DEPARTMENT_OTHER): Payer: Medicare Other

## 2015-05-09 VITALS — BP 133/59 | HR 73 | Temp 98.0°F | Resp 18 | Ht 62.0 in | Wt 178.4 lb

## 2015-05-09 DIAGNOSIS — Z170 Estrogen receptor positive status [ER+]: Secondary | ICD-10-CM

## 2015-05-09 DIAGNOSIS — M8580 Other specified disorders of bone density and structure, unspecified site: Secondary | ICD-10-CM | POA: Diagnosis not present

## 2015-05-09 DIAGNOSIS — C50312 Malignant neoplasm of lower-inner quadrant of left female breast: Secondary | ICD-10-CM | POA: Diagnosis not present

## 2015-05-09 LAB — COMPREHENSIVE METABOLIC PANEL
ALK PHOS: 59 U/L (ref 40–150)
ALT: 14 U/L (ref 0–55)
AST: 22 U/L (ref 5–34)
Albumin: 4 g/dL (ref 3.5–5.0)
Anion Gap: 11 mEq/L (ref 3–11)
BILIRUBIN TOTAL: 0.35 mg/dL (ref 0.20–1.20)
BUN: 13.9 mg/dL (ref 7.0–26.0)
CO2: 24 mEq/L (ref 22–29)
Calcium: 10 mg/dL (ref 8.4–10.4)
Chloride: 106 mEq/L (ref 98–109)
Creatinine: 0.9 mg/dL (ref 0.6–1.1)
EGFR: 69 mL/min/{1.73_m2} — ABNORMAL LOW (ref >90)
GLUCOSE: 106 mg/dL (ref 70–140)
Potassium: 4 mEq/L (ref 3.5–5.1)
SODIUM: 141 meq/L (ref 136–145)
TOTAL PROTEIN: 8.1 g/dL (ref 6.4–8.3)

## 2015-05-09 LAB — CBC WITH DIFFERENTIAL/PLATELET
BASO%: 0.2 % (ref 0.0–2.0)
Basophils Absolute: 0 10*3/uL (ref 0.0–0.1)
EOS%: 1.7 % (ref 0.0–7.0)
Eosinophils Absolute: 0.1 10*3/uL (ref 0.0–0.5)
HCT: 36.1 % (ref 34.8–46.6)
HGB: 11.9 g/dL (ref 11.6–15.9)
LYMPH%: 34 % (ref 14.0–49.7)
MCH: 27.9 pg (ref 25.1–34.0)
MCHC: 33 g/dL (ref 31.5–36.0)
MCV: 84.7 fL (ref 79.5–101.0)
MONO#: 0.3 10*3/uL (ref 0.1–0.9)
MONO%: 5.5 % (ref 0.0–14.0)
NEUT%: 58.6 % (ref 38.4–76.8)
NEUTROS ABS: 3.1 10*3/uL (ref 1.5–6.5)
Platelets: 224 10*3/uL (ref 145–400)
RBC: 4.26 10*6/uL (ref 3.70–5.45)
RDW: 14.5 % (ref 11.2–14.5)
WBC: 5.2 10*3/uL (ref 3.9–10.3)
lymph#: 1.8 10*3/uL (ref 0.9–3.3)

## 2015-05-09 MED ORDER — TAMOXIFEN CITRATE 20 MG PO TABS: 20.0000 mg | ORAL_TABLET | Freq: Every day | ORAL | Status: DC

## 2015-05-09 NOTE — Telephone Encounter (Signed)
Patient did not want to schedule 59yr f/u appointment today. They will call back at a later date to schedule appointment

## 2015-05-09 NOTE — Progress Notes (Signed)
[name]  [phone] [phone]     ID: [name] OB: 1933-11-26  MR#: [id number]  YWV#:[id number]  PCP: Danielle Calico, MD GYN:  [name] SU: [name] OTHER MD: [name]  CHIEF COMPLAINT: Early stage estrogen receptor positive breast cancer CURRENT TREATMENT: Tamoxifen  HISTORY OF PRESENT ILLNESS: From the original intake note:  [name] had routine screening mammography at [facility] 03/19/2012 showing a potential abnormality in the left breast. Additional views 03/26/2012 showed no discrete mass or architectural distortion. Left breast ultrasonography was negative.  Short interval followup was suggested and done 09/30/2012 the patient had left diagnostic mammography which showed no concerning change.  Bilateral diagnostic mammography 03/22/2013 showed the patient's breast density to be category B. There was an irregular focal asymmetry in the left breast at 8:00, however ultrasound of this area showed no corresponding abnormality. Accordingly a stereotactic biopsy of the area in question in the left breast was performed 03/28/2013, and showed ([id number]) an invasive ductal carcinoma, with lobular features, grade 1, estrogen receptor 99% positive, progesterone receptor 100% positive, both with strong staining intensity, with an MIB-1 of 6%, and no HER-2 amplification, the signals ratio being 1.35 and the copy number per cell 1.75.  MRI of the breast was obtained 04/05/2013. This confirmed a 2 cm irregular spiculated mass in the upper inner quadrant of the left breast with no additional areas of concern in that breast. In the right breast however at the 12:00 position there was a 1.3 cm irregular spiculated mass. There were 2 oval enhancing masses in the right breast felt consistent with intramammary lymph nodes. There were no abnormal appearing lymph nodes otherwise.  The patient's subsequent history is as detailed below  INTERVAL  HISTORY: [name] returns today for followup of her estrogen receptor positive breast cancer accompanied by her daughter [name]. She continues on tamoxifen, with very good tolerance. She has occasional hot flashes. She has a minimal vaginal wetness at times. Cost is not an issue.  REVIEW OF SYSTEMS: [name] of course is limited by her blindness. Nevertheless she is her husband's main caregiver. He has Alzheimer's disease. That is "and old age". She denies any unusual headaches, nausea, vomiting, cough, phlegm production, pleurisy, or change in bowel or bladder habits. A detailed review of systems today was otherwise stable.  PAST MEDICAL HISTORY: Past Medical History  Diagnosis Date  . VITAMIN D DEFICIENCY 01/25/2009  . HYPERLIPIDEMIA 12/14/2006  . BLINDNESS, [name], [place] DEFINITION 12/14/2006  . EXTERNAL HEMORRHOIDS 09/30/2007  . GERD 10/01/2007  . CONSTIPATION 12/14/2006  . RECTAL BLEEDING 10/01/2007  . OSTEOPENIA 02/07/2008  . OOPHORECTOMY, RIGHT, HX OF 12/14/2006  . ROTATOR CUFF REPAIR, RIGHT, HX OF 12/14/2006  . Anemia   . Arthritis   . Cancer     breast  . Edema   . Blind     PAST SURGICAL HISTORY: Past Surgical History  Procedure Laterality Date  . Rotator cuff repair      right  . Right oophorectomy    . Irrigation and debridement sebaceous cyst  06/07    [name]  . Tubal ligation    . Le art doppler   '98    normal  . Nuc stress test   07/07    normal study  . Abdominal hysterectomy    . Partial mastectomy with needle localization Left 05/11/2013    Procedure: PARTIAL MASTECTOMY WITH NEEDLE LOCALIZATION;  Surgeon: Danielle Hector, MD;  Location: [facility];  Service:  General;  Laterality: Left;    FAMILY HISTORY Family History  Problem Relation Age of Onset  . Hypertension Father   . Dementia Father   . Cancer Neg Hx     Negative for breast or colon cancer  . Heart disease Neg Hx    the patient's father was murdered at the age of 78. The patient's mother  died in her sleep at the age of 55. The patient has 3 brothers, 6 sisters. She has one cousin with breast cancer, but does not know at what age she was diagnosed. She also has a cousin with ovarian cancer, but again does not know at what age it was diagnosed.  GYNECOLOGIC HISTORY:  Menarche age 36, first live birth age 56, the patient is [place] P5. She went through the change of life at age 47. She did not use hormone replacement.   SOCIAL HISTORY:  [name] used to work at [employer], but is now retired. Her husband "[name]" is also blind. He suffers from significant Alzheimer's disease. The patient lives with her husband and 2 grandchildren, [name], 68, and 19, 49. The patient's children are [name], who works in [place] in Programmer, applications for a Theatre manager; [name] who works in [place] as a Curator; [name], [name], who works in [place] in Psychologist, educational; [name], who also lives in [place]; and [name], who is a Education officer, environmental. The patient has 6 grandchildren, four great-grandchildren. She attends a local [place]: Not in place; the patient intends to name her daughter [name] as healthcare power of attorney. [name] can be reached at [place]: Social History  Substance Use Topics  . Smoking status: Never Smoker   . Smokeless tobacco: Not on file  . Alcohol Use: No     Colonoscopy:  PAP:  Bone density: 04/07/2013 at [facility] showed osteopenia with a T score of -2.2 at the left femoral neck  Lipid panel:  No Known Allergies  Current Outpatient Prescriptions  Medication Sig Dispense Refill  . fexofenadine (ALLEGRA) 180 MG tablet Take 1 tablet (180 mg total) by mouth as needed. 30 tablet 11  . hydrochlorothiazide (HYDRODIURIL) 25 MG tablet TAKE 1 TABLET BY MOUTH AS NEEDED 30 tablet 5  . ibuprofen (ADVIL) 200 MG tablet Take 200 mg by mouth as needed.      . pravastatin (PRAVACHOL) 40 MG tablet TAKE 1 TABLET BY MOUTH AT BEDTIME 90  tablet 3  . ranitidine (ZANTAC) 150 MG tablet Take 150 mg by mouth daily as needed.     . tamoxifen (NOLVADEX) 20 MG tablet TAKE 1 TABLET (20 MG TOTAL) BY MOUTH DAILY. 30 tablet 0   No current facility-administered medications for this visit.    OBJECTIVE: Older African American woman who appears stated age 82 Vitals:   05/09/15 0954  BP: 133/59  Pulse: 73  Temp: 98 F (36.7 C)  Resp: 18     Body mass index is 32.62 kg/(m^2).    ECOG FS:0 - Asymptomatic  The patient is blind Oropharynx clear and moist-- no thrush or other lesions No cervical or supraclavicular adenopathy Lungs no rales or rhonchi Heart regular rate and rhythm Abd soft, nontender, positive bowel sounds MSK no focal spinal tenderness, no upper extremity lymphedema Neuro: nonfocal, well oriented, appropriate affect Breasts: The right breast is unremarkable. The left breast is status post lumpectomy and radiation. There is no evidence of local recurrence. The left axilla is benign.   LAB RESULTS:  CMP     Component Value Date/Time   NA 142 05/01/2014 1444   NA 140 07/06/2013 1135   K 3.8 05/01/2014 1444   K 4.1 07/06/2013 1135   CL 107 07/06/2013 1135   CO2 23 05/01/2014 1444   CO2 27 07/06/2013 1135   GLUCOSE 96 05/01/2014 1444   GLUCOSE 94 07/06/2013 1135   BUN 14.2 05/01/2014 1444   BUN 16 07/06/2013 1135   CREATININE 0.9 05/01/2014 1444   CREATININE 0.8 07/06/2013 1135   CALCIUM 9.9 05/01/2014 1444   CALCIUM 9.9 07/06/2013 1135   CALCIUM 9.9 12/24/2010 1018   PROT 7.8 05/01/2014 1444   PROT 7.9 07/06/2013 1135   ALBUMIN 4.0 05/01/2014 1444   ALBUMIN 4.2 07/06/2013 1135   AST 20 05/01/2014 1444   AST 23 07/06/2013 1135   ALT 14 05/01/2014 1444   ALT 15 07/06/2013 1135   ALKPHOS 57 05/01/2014 1444   ALKPHOS 75 07/06/2013 1135   BILITOT 0.26 05/01/2014 1444   BILITOT 0.7 07/06/2013 1135   GFRNONAA 62* 05/09/2013 0920   GFRAA 72* 05/09/2013 0920    I No results found for: SPEP  Lab  Results  Component Value Date   WBC 5.2 05/09/2015   NEUTROABS 3.1 05/09/2015   HGB 11.9 05/09/2015   HCT 36.1 05/09/2015   MCV 84.7 05/09/2015   PLT 224 05/09/2015      Chemistry      Component Value Date/Time   NA 142 05/01/2014 1444   NA 140 07/06/2013 1135   K 3.8 05/01/2014 1444   K 4.1 07/06/2013 1135   CL 107 07/06/2013 1135   CO2 23 05/01/2014 1444   CO2 27 07/06/2013 1135   BUN 14.2 05/01/2014 1444   BUN 16 07/06/2013 1135   CREATININE 0.9 05/01/2014 1444   CREATININE 0.8 07/06/2013 1135      Component Value Date/Time   CALCIUM 9.9 05/01/2014 1444   CALCIUM 9.9 07/06/2013 1135   CALCIUM 9.9 12/24/2010 1018   ALKPHOS 57 05/01/2014 1444   ALKPHOS 75 07/06/2013 1135   AST 20 05/01/2014 1444   AST 23 07/06/2013 1135   ALT 14 05/01/2014 1444   ALT 15 07/06/2013 1135   BILITOT 0.26 05/01/2014 1444   BILITOT 0.7 07/06/2013 1135       No results found for: LABCA2  No components found for: LABCA125  No results for input(s): INR in the last 168 hours.  Urinalysis    Component Value Date/Time   COLORURINE YELLOW 07/06/2013 1135   APPEARANCEUR CLEAR 07/06/2013 1135   LABSPEC 1.020 07/06/2013 1135   PHURINE 5.5 07/06/2013 1135   GLUCOSEU NEGATIVE 07/06/2013 1135   GLUCOSEU NEGATIVE 05/09/2013 0848   HGBUR TRACE-LYSED* 07/06/2013 1135   BILIRUBINUR NEGATIVE 07/06/2013 1135   KETONESUR NEGATIVE 07/06/2013 1135   PROTEINUR NEGATIVE 05/09/2013 0848   UROBILINOGEN 0.2 07/06/2013 1135   NITRITE NEGATIVE 07/06/2013 1135   LEUKOCYTESUR NEGATIVE 07/06/2013 1135    STUDIES: Mammography at [facility] 04/20/2015 by the breast density to be category B. There was no evidence of malignancy. Bone density scan on the same day showed a T score of -1.6. Prior was -2.2 (different machine however)  ASSESSMENT: 82 y.o.  woman status post left breast biopsy 03/28/2013 for a clinical T1c N0, stage IA invasive (lobular) carcinoma, grade 1, estrogen and progesterone  receptor positive, HER-2 negative, with an MIB-1 of 6%  (1) a 1.3 cm lesion noted by MRI in the right breast was proven benign by biopsy 04/20/2013  (  2) status post left lumpectomy without salpingo-oophorectomy 05/11/2013 for a pT1c cN0, stage IA invasive ductal carcinoma, grade 1, repeat HER-2 again negative, with ample margins  (3) the patient was evaluated by radiation oncology, who felt if she took anti-estrogens it would be safe for her to forego radiation  (4) tamoxifen started April 2015  (5) osteopenia, T score of -2.25 March 2013, repeat January 2017 was -1.6   PLAN: Rickayla  Is now 2 years out from her definitive surgery with no evidence of disease recurrence. This is very favorable.  She is tolerating the tamoxifen well, and obtains it under very good price. There has been some apparent improvement in her bone density. I think the better plan in her case would be to continue tamoxifen for a total of 5 years. The slightly increased and if it in terms of risk reduction in switching to anastrozole likely would be offset by worsening problems with her bone density.  At this point I feel comfortable seeing her on a once a year basis. She knows to call for any problems develop before her next visit here.  Chauncey Cruel, MD   05/09/2015 10:10 AM

## 2015-05-18 ENCOUNTER — Other Ambulatory Visit: Payer: Self-pay | Admitting: *Deleted

## 2015-07-04 DIAGNOSIS — Z124 Encounter for screening for malignant neoplasm of cervix: Secondary | ICD-10-CM | POA: Diagnosis not present

## 2015-07-04 DIAGNOSIS — Z01419 Encounter for gynecological examination (general) (routine) without abnormal findings: Secondary | ICD-10-CM | POA: Diagnosis not present

## 2015-07-06 ENCOUNTER — Other Ambulatory Visit: Payer: Self-pay | Admitting: *Deleted

## 2015-07-06 MED ORDER — TAMOXIFEN CITRATE 20 MG PO TABS: 20.0000 mg | ORAL_TABLET | Freq: Every day | ORAL | Status: DC

## 2015-07-10 ENCOUNTER — Encounter: Payer: Self-pay | Admitting: Internal Medicine

## 2015-07-10 ENCOUNTER — Other Ambulatory Visit (INDEPENDENT_AMBULATORY_CARE_PROVIDER_SITE_OTHER): Payer: Medicare Other

## 2015-07-10 ENCOUNTER — Ambulatory Visit (INDEPENDENT_AMBULATORY_CARE_PROVIDER_SITE_OTHER): Payer: Medicare Other | Admitting: Internal Medicine

## 2015-07-10 VITALS — BP 116/80 | HR 78 | Temp 98.7°F | Ht 62.0 in | Wt 177.0 lb

## 2015-07-10 DIAGNOSIS — K219 Gastro-esophageal reflux disease without esophagitis: Secondary | ICD-10-CM

## 2015-07-10 DIAGNOSIS — R739 Hyperglycemia, unspecified: Secondary | ICD-10-CM

## 2015-07-10 DIAGNOSIS — I10 Essential (primary) hypertension: Secondary | ICD-10-CM

## 2015-07-10 DIAGNOSIS — E785 Hyperlipidemia, unspecified: Secondary | ICD-10-CM

## 2015-07-10 DIAGNOSIS — Z0000 Encounter for general adult medical examination without abnormal findings: Secondary | ICD-10-CM | POA: Diagnosis not present

## 2015-07-10 DIAGNOSIS — Z23 Encounter for immunization: Secondary | ICD-10-CM | POA: Diagnosis not present

## 2015-07-10 DIAGNOSIS — Z01419 Encounter for gynecological examination (general) (routine) without abnormal findings: Secondary | ICD-10-CM | POA: Diagnosis not present

## 2015-07-10 LAB — TSH: TSH: 0.63 u[IU]/mL (ref 0.35–4.50)

## 2015-07-10 LAB — LIPID PANEL
CHOL/HDL RATIO: 3
CHOLESTEROL: 170 mg/dL (ref 0–200)
HDL: 60.9 mg/dL (ref >39.00)
LDL Cholesterol: 78 mg/dL (ref 0–99)
NonHDL: 108.84
TRIGLYCERIDES: 155 mg/dL — AB (ref 0.0–149.0)
VLDL: 31 mg/dL (ref 0.0–40.0)

## 2015-07-10 LAB — HEMOGLOBIN A1C: HEMOGLOBIN A1C: 6.4 % (ref 4.6–6.5)

## 2015-07-10 NOTE — Assessment & Plan Note (Signed)
Her blood pressure is adequately well controlled. Her lites and renal function are stable.

## 2015-07-10 NOTE — Patient Instructions (Signed)
Preventive Care for Adults, Female A healthy lifestyle and preventive care can promote health and wellness. Preventive health guidelines for women include the following key practices.  A routine yearly physical is a good way to check with your health care provider about your health and preventive screening. It is a chance to share any concerns and updates on your health and to receive a thorough exam.  Visit your dentist for a routine exam and preventive care every 6 months. Brush your teeth twice a day and floss once a day. Good oral hygiene prevents tooth decay and gum disease.  The frequency of eye exams is based on your age, health, family medical history, use of contact lenses, and other factors. Follow your health care provider's recommendations for frequency of eye exams.  Eat a healthy diet. Foods like vegetables, fruits, whole grains, low-fat dairy products, and lean protein foods contain the nutrients you need without too many calories. Decrease your intake of foods high in solid fats, added sugars, and salt. Eat the right amount of calories for you.Get information about a proper diet from your health care provider, if necessary.  Regular physical exercise is one of the most important things you can do for your health. Most adults should get at least 150 minutes of moderate-intensity exercise (any activity that increases your heart rate and causes you to sweat) each week. In addition, most adults need muscle-strengthening exercises on 2 or more days a week.  Maintain a healthy weight. The body mass index (BMI) is a screening tool to identify possible weight problems. It provides an estimate of body fat based on height and weight. Your health care provider can find your BMI and can help you achieve or maintain a healthy weight.For adults 20 years and older:  A BMI below 18.5 is considered underweight.  A BMI of 18.5 to 24.9 is normal.  A BMI of 25 to 29.9 is considered overweight.  A  BMI of 30 and above is considered obese.  Maintain normal blood lipids and cholesterol levels by exercising and minimizing your intake of saturated fat. Eat a balanced diet with plenty of fruit and vegetables. Blood tests for lipids and cholesterol should begin at age 45 and be repeated every 5 years. If your lipid or cholesterol levels are high, you are over 50, or you are at high risk for heart disease, you may need your cholesterol levels checked more frequently.Ongoing high lipid and cholesterol levels should be treated with medicines if diet and exercise are not working.  If you smoke, find out from your health care provider how to quit. If you do not use tobacco, do not start.  Lung cancer screening is recommended for adults aged 45-80 years who are at high risk for developing lung cancer because of a history of smoking. A yearly low-dose CT scan of the lungs is recommended for people who have at least a 30-pack-year history of smoking and are a current smoker or have quit within the past 15 years. A pack year of smoking is smoking an average of 1 pack of cigarettes a day for 1 year (for example: 1 pack a day for 30 years or 2 packs a day for 15 years). Yearly screening should continue until the smoker has stopped smoking for at least 15 years. Yearly screening should be stopped for people who develop a health problem that would prevent them from having lung cancer treatment.  If you are pregnant, do not drink alcohol. If you are  breastfeeding, be very cautious about drinking alcohol. If you are not pregnant and choose to drink alcohol, do not have more than 1 drink per day. One drink is considered to be 12 ounces (355 mL) of beer, 5 ounces (148 mL) of wine, or 1.5 ounces (44 mL) of liquor.  Avoid use of street drugs. Do not share needles with anyone. Ask for help if you need support or instructions about stopping the use of drugs.  High blood pressure causes heart disease and increases the risk  of stroke. Your blood pressure should be checked at least every 1 to 2 years. Ongoing high blood pressure should be treated with medicines if weight loss and exercise do not work.  If you are 55-79 years old, ask your health care provider if you should take aspirin to prevent strokes.  Diabetes screening is done by taking a blood sample to check your blood glucose level after you have not eaten for a certain period of time (fasting). If you are not overweight and you do not have risk factors for diabetes, you should be screened once every 3 years starting at age 45. If you are overweight or obese and you are 40-70 years of age, you should be screened for diabetes every year as part of your cardiovascular risk assessment.  Breast cancer screening is essential preventive care for women. You should practice "breast self-awareness." This means understanding the normal appearance and feel of your breasts and may include breast self-examination. Any changes detected, no matter how small, should be reported to a health care provider. Women in their 20s and 30s should have a clinical breast exam (CBE) by a health care provider as part of a regular health exam every 1 to 3 years. After age 40, women should have a CBE every year. Starting at age 40, women should consider having a mammogram (breast X-ray test) every year. Women who have a family history of breast cancer should talk to their health care provider about genetic screening. Women at a high risk of breast cancer should talk to their health care providers about having an MRI and a mammogram every year.  Breast cancer gene (BRCA)-related cancer risk assessment is recommended for women who have family members with BRCA-related cancers. BRCA-related cancers include breast, ovarian, tubal, and peritoneal cancers. Having family members with these cancers may be associated with an increased risk for harmful changes (mutations) in the breast cancer genes BRCA1 and  BRCA2. Results of the assessment will determine the need for genetic counseling and BRCA1 and BRCA2 testing.  Your health care provider may recommend that you be screened regularly for cancer of the pelvic organs (ovaries, uterus, and vagina). This screening involves a pelvic examination, including checking for microscopic changes to the surface of your cervix (Pap test). You may be encouraged to have this screening done every 3 years, beginning at age 21.  For women ages 30-65, health care providers may recommend pelvic exams and Pap testing every 3 years, or they may recommend the Pap and pelvic exam, combined with testing for human papilloma virus (HPV), every 5 years. Some types of HPV increase your risk of cervical cancer. Testing for HPV may also be done on women of any age with unclear Pap test results.  Other health care providers may not recommend any screening for nonpregnant women who are considered low risk for pelvic cancer and who do not have symptoms. Ask your health care provider if a screening pelvic exam is right for   you.  If you have had past treatment for cervical cancer or a condition that could lead to cancer, you need Pap tests and screening for cancer for at least 20 years after your treatment. If Pap tests have been discontinued, your risk factors (such as having a new sexual partner) need to be reassessed to determine if screening should resume. Some women have medical problems that increase the chance of getting cervical cancer. In these cases, your health care provider may recommend more frequent screening and Pap tests.  Colorectal cancer can be detected and often prevented. Most routine colorectal cancer screening begins at the age of 50 years and continues through age 75 years. However, your health care provider may recommend screening at an earlier age if you have risk factors for colon cancer. On a yearly basis, your health care provider may provide home test kits to check  for hidden blood in the stool. Use of a small camera at the end of a tube, to directly examine the colon (sigmoidoscopy or colonoscopy), can detect the earliest forms of colorectal cancer. Talk to your health care provider about this at age 50, when routine screening begins. Direct exam of the colon should be repeated every 5-10 years through age 75 years, unless early forms of precancerous polyps or small growths are found.  People who are at an increased risk for hepatitis B should be screened for this virus. You are considered at high risk for hepatitis B if:  You were born in a country where hepatitis B occurs often. Talk with your health care provider about which countries are considered high risk.  Your parents were born in a high-risk country and you have not received a shot to protect against hepatitis B (hepatitis B vaccine).  You have HIV or AIDS.  You use needles to inject street drugs.  You live with, or have sex with, someone who has hepatitis B.  You get hemodialysis treatment.  You take certain medicines for conditions like cancer, organ transplantation, and autoimmune conditions.  Hepatitis C blood testing is recommended for all people born from 1945 through 1965 and any individual with known risks for hepatitis C.  Practice safe sex. Use condoms and avoid high-risk sexual practices to reduce the spread of sexually transmitted infections (STIs). STIs include gonorrhea, chlamydia, syphilis, trichomonas, herpes, HPV, and human immunodeficiency virus (HIV). Herpes, HIV, and HPV are viral illnesses that have no cure. They can result in disability, cancer, and death.  You should be screened for sexually transmitted illnesses (STIs) including gonorrhea and chlamydia if:  You are sexually active and are younger than 24 years.  You are older than 24 years and your health care provider tells you that you are at risk for this type of infection.  Your sexual activity has changed  since you were last screened and you are at an increased risk for chlamydia or gonorrhea. Ask your health care provider if you are at risk.  If you are at risk of being infected with HIV, it is recommended that you take a prescription medicine daily to prevent HIV infection. This is called preexposure prophylaxis (PrEP). You are considered at risk if:  You are sexually active and do not regularly use condoms or know the HIV status of your partner(s).  You take drugs by injection.  You are sexually active with a partner who has HIV.  Talk with your health care provider about whether you are at high risk of being infected with HIV. If   you choose to begin PrEP, you should first be tested for HIV. You should then be tested every 3 months for as long as you are taking PrEP.  Osteoporosis is a disease in which the bones lose minerals and strength with aging. This can result in serious bone fractures or breaks. The risk of osteoporosis can be identified using a bone density scan. Women ages 67 years and over and women at risk for fractures or osteoporosis should discuss screening with their health care providers. Ask your health care provider whether you should take a calcium supplement or vitamin D to reduce the rate of osteoporosis.  Menopause can be associated with physical symptoms and risks. Hormone replacement therapy is available to decrease symptoms and risks. You should talk to your health care provider about whether hormone replacement therapy is right for you.  Use sunscreen. Apply sunscreen liberally and repeatedly throughout the day. You should seek shade when your shadow is shorter than you. Protect yourself by wearing long sleeves, pants, a wide-brimmed hat, and sunglasses year round, whenever you are outdoors.  Once a month, do a whole body skin exam, using a mirror to look at the skin on your back. Tell your health care provider of new moles, moles that have irregular borders, moles that  are larger than a pencil eraser, or moles that have changed in shape or color.  Stay current with required vaccines (immunizations).  Influenza vaccine. All adults should be immunized every year.  Tetanus, diphtheria, and acellular pertussis (Td, Tdap) vaccine. Pregnant women should receive 1 dose of Tdap vaccine during each pregnancy. The dose should be obtained regardless of the length of time since the last dose. Immunization is preferred during the 27th-36th week of gestation. An adult who has not previously received Tdap or who does not know her vaccine status should receive 1 dose of Tdap. This initial dose should be followed by tetanus and diphtheria toxoids (Td) booster doses every 10 years. Adults with an unknown or incomplete history of completing a 3-dose immunization series with Td-containing vaccines should begin or complete a primary immunization series including a Tdap dose. Adults should receive a Td booster every 10 years.  Varicella vaccine. An adult without evidence of immunity to varicella should receive 2 doses or a second dose if she has previously received 1 dose. Pregnant females who do not have evidence of immunity should receive the first dose after pregnancy. This first dose should be obtained before leaving the health care facility. The second dose should be obtained 4-8 weeks after the first dose.  Human papillomavirus (HPV) vaccine. Females aged 13-26 years who have not received the vaccine previously should obtain the 3-dose series. The vaccine is not recommended for use in pregnant females. However, pregnancy testing is not needed before receiving a dose. If a female is found to be pregnant after receiving a dose, no treatment is needed. In that case, the remaining doses should be delayed until after the pregnancy. Immunization is recommended for any person with an immunocompromised condition through the age of 61 years if she did not get any or all doses earlier. During the  3-dose series, the second dose should be obtained 4-8 weeks after the first dose. The third dose should be obtained 24 weeks after the first dose and 16 weeks after the second dose.  Zoster vaccine. One dose is recommended for adults aged 30 years or older unless certain conditions are present.  Measles, mumps, and rubella (MMR) vaccine. Adults born  before 1957 generally are considered immune to measles and mumps. Adults born in 1957 or later should have 1 or more doses of MMR vaccine unless there is a contraindication to the vaccine or there is laboratory evidence of immunity to each of the three diseases. A routine second dose of MMR vaccine should be obtained at least 28 days after the first dose for students attending postsecondary schools, health care workers, or international travelers. People who received inactivated measles vaccine or an unknown type of measles vaccine during 1963-1967 should receive 2 doses of MMR vaccine. People who received inactivated mumps vaccine or an unknown type of mumps vaccine before 1979 and are at high risk for mumps infection should consider immunization with 2 doses of MMR vaccine. For females of childbearing age, rubella immunity should be determined. If there is no evidence of immunity, females who are not pregnant should be vaccinated. If there is no evidence of immunity, females who are pregnant should delay immunization until after pregnancy. Unvaccinated health care workers born before 1957 who lack laboratory evidence of measles, mumps, or rubella immunity or laboratory confirmation of disease should consider measles and mumps immunization with 2 doses of MMR vaccine or rubella immunization with 1 dose of MMR vaccine.  Pneumococcal 13-valent conjugate (PCV13) vaccine. When indicated, a person who is uncertain of his immunization history and has no record of immunization should receive the PCV13 vaccine. All adults 65 years of age and older should receive this  vaccine. An adult aged 19 years or older who has certain medical conditions and has not been previously immunized should receive 1 dose of PCV13 vaccine. This PCV13 should be followed with a dose of pneumococcal polysaccharide (PPSV23) vaccine. Adults who are at high risk for pneumococcal disease should obtain the PPSV23 vaccine at least 8 weeks after the dose of PCV13 vaccine. Adults older than 80 years of age who have normal immune system function should obtain the PPSV23 vaccine dose at least 1 year after the dose of PCV13 vaccine.  Pneumococcal polysaccharide (PPSV23) vaccine. When PCV13 is also indicated, PCV13 should be obtained first. All adults aged 65 years and older should be immunized. An adult younger than age 65 years who has certain medical conditions should be immunized. Any person who resides in a nursing home or long-term care facility should be immunized. An adult smoker should be immunized. People with an immunocompromised condition and certain other conditions should receive both PCV13 and PPSV23 vaccines. People with human immunodeficiency virus (HIV) infection should be immunized as soon as possible after diagnosis. Immunization during chemotherapy or radiation therapy should be avoided. Routine use of PPSV23 vaccine is not recommended for American Indians, [place] Natives, or people younger than 65 years unless there are medical conditions that require PPSV23 vaccine. When indicated, people who have unknown immunization and have no record of immunization should receive PPSV23 vaccine. One-time revaccination 5 years after the first dose of PPSV23 is recommended for people aged 19-64 years who have chronic kidney failure, nephrotic syndrome, asplenia, or immunocompromised conditions. People who received 1-2 doses of PPSV23 before age 65 years should receive another dose of PPSV23 vaccine at age 65 years or later if at least 5 years have passed since the previous dose. Doses of PPSV23 are not  needed for people immunized with PPSV23 at or after age 65 years.  Meningococcal vaccine. Adults with asplenia or persistent complement component deficiencies should receive 2 doses of quadrivalent meningococcal conjugate (MenACWY-D) vaccine. The doses should be obtained   at least 2 months apart. Microbiologists working with certain meningococcal bacteria, [place] recruits, people at risk during an outbreak, and people who travel to or live in countries with a high rate of meningitis should be immunized. A first-year college student up through age 34 years who is living in a residence hall should receive a dose if she did not receive a dose on or after her 16th birthday. Adults who have certain high-risk conditions should receive one or more doses of vaccine.  Hepatitis A vaccine. Adults who wish to be protected from this disease, have certain high-risk conditions, work with hepatitis A-infected animals, work in hepatitis A research labs, or travel to or work in countries with a high rate of hepatitis A should be immunized. Adults who were previously unvaccinated and who anticipate close contact with an international adoptee during the first 60 days after arrival in the [place] from a country with a high rate of hepatitis A should be immunized.  Hepatitis B vaccine. Adults who wish to be protected from this disease, have certain high-risk conditions, may be exposed to blood or other infectious body fluids, are household contacts or sex partners of hepatitis B positive people, are clients or workers in certain care facilities, or travel to or work in countries with a high rate of hepatitis B should be immunized.  Haemophilus influenzae type b (Hib) vaccine. A previously unvaccinated person with asplenia or sickle cell disease or having a scheduled splenectomy should receive 1 dose of Hib vaccine. Regardless of previous immunization, a recipient of a hematopoietic stem cell transplant should receive a  3-dose series 6-12 months after her successful transplant. Hib vaccine is not recommended for adults with HIV infection. Preventive Services / Frequency Ages 35 to 4 years  Blood pressure check.** / Every 3-5 years.  Lipid and cholesterol check.** / Every 5 years beginning at age 60.  Clinical breast exam.** / Every 3 years for women in their 71s and 10s.  BRCA-related cancer risk assessment.** / For women who have family members with a BRCA-related cancer (breast, ovarian, tubal, or peritoneal cancers).  Pap test.** / Every 2 years from ages 76 through 26. Every 3 years starting at age 61 through age 76 or 93 with a history of 3 consecutive normal Pap tests.  HPV screening.** / Every 3 years from ages 37 through ages 60 to 51 with a history of 3 consecutive normal Pap tests.  Hepatitis C blood test.** / For any individual with known risks for hepatitis C.  Skin self-exam. / Monthly.  Influenza vaccine. / Every year.  Tetanus, diphtheria, and acellular pertussis (Tdap, Td) vaccine.** / Consult your health care provider. Pregnant women should receive 1 dose of Tdap vaccine during each pregnancy. 1 dose of Td every 10 years.  Varicella vaccine.** / Consult your health care provider. Pregnant females who do not have evidence of immunity should receive the first dose after pregnancy.  HPV vaccine. / 3 doses over 6 months, if 93 and younger. The vaccine is not recommended for use in pregnant females. However, pregnancy testing is not needed before receiving a dose.  Measles, mumps, rubella (MMR) vaccine.** / You need at least 1 dose of MMR if you were born in 1957 or later. You may also need a 2nd dose. For females of childbearing age, rubella immunity should be determined. If there is no evidence of immunity, females who are not pregnant should be vaccinated. If there is no evidence of immunity, females who are  pregnant should delay immunization until after pregnancy.  Pneumococcal  13-valent conjugate (PCV13) vaccine.** / Consult your health care provider.  Pneumococcal polysaccharide (PPSV23) vaccine.** / 1 to 2 doses if you smoke cigarettes or if you have certain conditions.  Meningococcal vaccine.** / 1 dose if you are age 68 to 8 years and a Market researcher living in a residence hall, or have one of several medical conditions, you need to get vaccinated against meningococcal disease. You may also need additional booster doses.  Hepatitis A vaccine.** / Consult your health care provider.  Hepatitis B vaccine.** / Consult your health care provider.  Haemophilus influenzae type b (Hib) vaccine.** / Consult your health care provider. Ages 7 to 53 years  Blood pressure check.** / Every year.  Lipid and cholesterol check.** / Every 5 years beginning at age 25 years.  Lung cancer screening. / Every year if you are aged 11-80 years and have a 30-pack-year history of smoking and currently smoke or have quit within the past 15 years. Yearly screening is stopped once you have quit smoking for at least 15 years or develop a health problem that would prevent you from having lung cancer treatment.  Clinical breast exam.** / Every year after age 48 years.  BRCA-related cancer risk assessment.** / For women who have family members with a BRCA-related cancer (breast, ovarian, tubal, or peritoneal cancers).  Mammogram.** / Every year beginning at age 41 years and continuing for as long as you are in good health. Consult with your health care provider.  Pap test.** / Every 3 years starting at age 65 years through age 37 or 70 years with a history of 3 consecutive normal Pap tests.  HPV screening.** / Every 3 years from ages 72 years through ages 60 to 40 years with a history of 3 consecutive normal Pap tests.  Fecal occult blood test (FOBT) of stool. / Every year beginning at age 21 years and continuing until age 5 years. You may not need to do this test if you get  a colonoscopy every 10 years.  Flexible sigmoidoscopy or colonoscopy.** / Every 5 years for a flexible sigmoidoscopy or every 10 years for a colonoscopy beginning at age 35 years and continuing until age 48 years.  Hepatitis C blood test.** / For all people born from [birth year] through 1965 and any individual with known risks for hepatitis C.  Skin self-exam. / Monthly.  Influenza vaccine. / Every year.  Tetanus, diphtheria, and acellular pertussis (Tdap/Td) vaccine.** / Consult your health care provider. Pregnant women should receive 1 dose of Tdap vaccine during each pregnancy. 1 dose of Td every 10 years.  Varicella vaccine.** / Consult your health care provider. Pregnant females who do not have evidence of immunity should receive the first dose after pregnancy.  Zoster vaccine.** / 1 dose for adults aged 30 years or older.  Measles, mumps, rubella (MMR) vaccine.** / You need at least 1 dose of MMR if you were born in 1957 or later. You may also need a second dose. For females of childbearing age, rubella immunity should be determined. If there is no evidence of immunity, females who are not pregnant should be vaccinated. If there is no evidence of immunity, females who are pregnant should delay immunization until after pregnancy.  Pneumococcal 13-valent conjugate (PCV13) vaccine.** / Consult your health care provider.  Pneumococcal polysaccharide (PPSV23) vaccine.** / 1 to 2 doses if you smoke cigarettes or if you have certain conditions.  Meningococcal vaccine.** /  Consult your health care provider.  Hepatitis A vaccine.** / Consult your health care provider.  Hepatitis B vaccine.** / Consult your health care provider.  Haemophilus influenzae type b (Hib) vaccine.** / Consult your health care provider. Ages 64 years and over  Blood pressure check.** / Every year.  Lipid and cholesterol check.** / Every 5 years beginning at age 23 years.  Lung cancer screening. / Every year if you  are aged 16-80 years and have a 30-pack-year history of smoking and currently smoke or have quit within the past 15 years. Yearly screening is stopped once you have quit smoking for at least 15 years or develop a health problem that would prevent you from having lung cancer treatment.  Clinical breast exam.** / Every year after age 74 years.  BRCA-related cancer risk assessment.** / For women who have family members with a BRCA-related cancer (breast, ovarian, tubal, or peritoneal cancers).  Mammogram.** / Every year beginning at age 44 years and continuing for as long as you are in good health. Consult with your health care provider.  Pap test.** / Every 3 years starting at age 58 years through age 22 or 39 years with 3 consecutive normal Pap tests. Testing can be stopped between 65 and 70 years with 3 consecutive normal Pap tests and no abnormal Pap or HPV tests in the past 10 years.  HPV screening.** / Every 3 years from ages 64 years through ages 70 or 61 years with a history of 3 consecutive normal Pap tests. Testing can be stopped between 65 and 70 years with 3 consecutive normal Pap tests and no abnormal Pap or HPV tests in the past 10 years.  Fecal occult blood test (FOBT) of stool. / Every year beginning at age 40 years and continuing until age 27 years. You may not need to do this test if you get a colonoscopy every 10 years.  Flexible sigmoidoscopy or colonoscopy.** / Every 5 years for a flexible sigmoidoscopy or every 10 years for a colonoscopy beginning at age 7 years and continuing until age 32 years.  Hepatitis C blood test.** / For all people born from [birth year] through 1963 and any individual with known risks for hepatitis C.  Osteoporosis screening.** / A one-time screening for women ages 30 years and over and women at risk for fractures or osteoporosis.  Skin self-exam. / Monthly.  Influenza vaccine. / Every year.  Tetanus, diphtheria, and acellular pertussis (Tdap/Td)  vaccine.** / 1 dose of Td every 10 years.  Varicella vaccine.** / Consult your health care provider.  Zoster vaccine.** / 1 dose for adults aged 35 years or older.  Pneumococcal 13-valent conjugate (PCV13) vaccine.** / Consult your health care provider.  Pneumococcal polysaccharide (PPSV23) vaccine.** / 1 dose for all adults aged 46 years and older.  Meningococcal vaccine.** / Consult your health care provider.  Hepatitis A vaccine.** / Consult your health care provider.  Hepatitis B vaccine.** / Consult your health care provider.  Haemophilus influenzae type b (Hib) vaccine.** / Consult your health care provider. ** Family history and personal history of risk and conditions may change your health care provider's recommendations.   This information is not intended to replace advice given to you by your health care provider. Make sure you discuss any questions you have with your health care provider.   Document Released: 05/06/2001 Document Revised: 03/31/2014 Document Reviewed: 08/05/2010 Elsevier Interactive Patient Education [employer].

## 2015-07-10 NOTE — Progress Notes (Signed)
Pre visit review using our clinic review tool, if applicable. No additional management support is needed unless otherwise documented below in the visit note. 

## 2015-07-10 NOTE — Assessment & Plan Note (Signed)

## 2015-07-10 NOTE — Progress Notes (Signed)
Subjective:  Patient ID: [name], female    DOB: 06-30-1933  Age: 82 y.o. MRN: [id number]  CC: Hyperlipidemia; Hypertension; and Annual Exam   HPI [name] presents for a CPX - She tells me that her blood pressure has been well controlled on hydrochlorothiazide. She denies headache, chest pain, shortness of breath, or edema.  She is due for a lipid panel. She is tolerating pravastatin well with no muscle aches or joint aches.  Outpatient Prescriptions Prior to Visit  Medication Sig Dispense Refill  . ibuprofen (ADVIL) 200 MG tablet Take 200 mg by mouth as needed.      . pravastatin (PRAVACHOL) 40 MG tablet TAKE 1 TABLET BY MOUTH AT BEDTIME 90 tablet 3  . ranitidine (ZANTAC) 150 MG tablet Take 150 mg by mouth daily as needed.     . tamoxifen (NOLVADEX) 20 MG tablet Take 1 tablet (20 mg total) by mouth daily. 90 tablet 5  . hydrochlorothiazide (HYDRODIURIL) 25 MG tablet TAKE 1 TABLET BY MOUTH AS NEEDED (Patient not taking: Reported on 07/10/2015) 30 tablet 5  . fexofenadine (ALLEGRA) 180 MG tablet Take 1 tablet (180 mg total) by mouth as needed. 30 tablet 11   No facility-administered medications prior to visit.    ROS Review of Systems  Constitutional: Negative.  Negative for fatigue.  HENT: Negative.   Eyes: Negative.   Respiratory: Negative.  Negative for cough, choking, chest tightness, shortness of breath and stridor.   Cardiovascular: Negative.  Negative for chest pain, palpitations and leg swelling.  Gastrointestinal: Negative.  Negative for nausea, vomiting, abdominal pain, diarrhea, constipation and blood in stool.  Endocrine: Negative.   Genitourinary: Negative.  Negative for dysuria and difficulty urinating.  Musculoskeletal: Negative.  Negative for myalgias, back pain, joint swelling and arthralgias.  Skin: Negative.  Negative for color change and rash.  Allergic/Immunologic: Negative.   Neurological: Negative.  Negative for dizziness, weakness,  light-headedness and headaches.  Hematological: Negative.  Negative for adenopathy. Does not bruise/bleed easily.  Psychiatric/Behavioral: Negative.     Objective:  BP 116/80 mmHg  Pulse 78  Temp(Src) 98.7 F (37.1 C) (Oral)  Ht 5\' 2"  (1.575 m)  Wt 177 lb (80.287 kg)  BMI 32.37 kg/m2  SpO2 97%  BP Readings from Last 3 Encounters:  07/10/15 116/80  05/09/15 133/59  07/04/14 136/72    Wt Readings from Last 3 Encounters:  07/10/15 177 lb (80.287 kg)  05/09/15 178 lb 6.4 oz (80.922 kg)  07/04/14 182 lb (82.555 kg)    Physical Exam  Constitutional: She is oriented to person, place, and time. No distress.  HENT:  Head: Normocephalic and atraumatic.  Mouth/Throat: Oropharynx is clear and moist. No oropharyngeal exudate.  Eyes: Conjunctivae are normal. Right eye exhibits no discharge. Left eye exhibits no discharge. No scleral icterus.  Neck: Normal range of motion. Neck supple. No JVD present. No tracheal deviation present. No thyromegaly present.  Cardiovascular: Normal rate, regular rhythm, normal heart sounds and intact distal pulses.  Exam reveals no gallop and no friction rub.   No murmur heard. Pulmonary/Chest: Effort normal and breath sounds normal. No stridor. No respiratory distress. She has no wheezes. She has no rales. She exhibits no mass and no tenderness. Right breast exhibits no inverted nipple, no mass, no nipple discharge, no skin change and no tenderness. Left breast exhibits no inverted nipple, no mass, no nipple discharge, no skin change and no tenderness. Breasts are symmetrical.  Abdominal: Soft. Bowel sounds are normal. She  exhibits no distension and no mass. There is no tenderness. There is no rebound and no guarding.  Genitourinary: No breast swelling, tenderness, discharge or bleeding. Pelvic exam was performed with patient supine.  GU and rectal exams refused by her  Musculoskeletal: Normal range of motion. She exhibits no edema or tenderness.    Lymphadenopathy:    She has no cervical adenopathy.  Neurological: She is oriented to person, place, and time.  Skin: Skin is warm and dry. No rash noted. She is not diaphoretic. No erythema. No pallor.  Psychiatric: She has a normal mood and affect. Her behavior is normal. Judgment and thought content normal.  Vitals reviewed.   Lab Results  Component Value Date   WBC 5.2 05/09/2015   HGB 11.9 05/09/2015   HCT 36.1 05/09/2015   PLT 224 05/09/2015   GLUCOSE 106 05/09/2015   CHOL 170 07/10/2015   TRIG 155.0* 07/10/2015   HDL 60.90 07/10/2015   LDLDIRECT 133.2 09/04/2009   LDLCALC 78 07/10/2015   ALT 14 05/09/2015   AST 22 05/09/2015   NA 141 05/09/2015   K 4.0 05/09/2015   CL 107 07/06/2013   CREATININE 0.9 05/09/2015   BUN 13.9 05/09/2015   CO2 24 05/09/2015   TSH 0.63 07/10/2015   HGBA1C 6.4 07/10/2015    Dg Abd Acute W/chest  07/06/2013  CLINICAL DATA:  pain EXAM: ACUTE ABDOMEN SERIES (ABDOMEN 2 VIEW & CHEST 1 VIEW) COMPARISON:  CT ANGIO CHEST W/CM &/OR WO/CM dated 05/20/2008; DG CHEST 2 VIEW dated 05/20/2008 FINDINGS: There is no evidence of dilated bowel loops or free intraperitoneal air. No radiopaque calculi or other significant radiographic abnormality is seen. Heart size and mediastinal contours are within normal limits. Both lungs are clear. Levoscoliosis with a rotatory component in the thoracolumbar spine. Moderate amount of fecal retention. IMPRESSION: Negative abdominal radiographs.  No acute cardiopulmonary disease. Electronically Signed   By: Margaree Mackintosh M.D.   On: 07/06/2013 14:58    Assessment & Plan:   Laverda was seen today for hyperlipidemia, hypertension and annual exam.  Diagnoses and all orders for this visit:  Hyperglycemia- She is prediabetic, no medications are needed at this time, she agrees to work on her lifestyle modifications with diet/exercise-weight loss. -     Hemoglobin A1c; Future  Hyperlipidemia with target LDL less than 130- she has  achieved her LDL goal is doing well on the statin. -     Lipid panel; Future -     TSH; Future  Routine health maintenance  Gastroesophageal reflux disease without esophagitis- her symptoms are well controlled with an H2 blocker.  Need for 23-polyvalent pneumococcal polysaccharide vaccine -     Pneumococcal polysaccharide vaccine 23-valent greater than or equal to 2yo subcutaneous/IM   I have discontinued Ms. Maxim's fexofenadine. I am also having her maintain her ibuprofen, ranitidine, hydrochlorothiazide, pravastatin, and tamoxifen.  No orders of the defined types were placed in this encounter.    See AVS for instructions about healthy living and anticipatory guidance.   Follow-up: Return in about 6 months (around 01/09/2016).  Scarlette Calico, MD

## 2015-07-26 ENCOUNTER — Other Ambulatory Visit: Payer: Self-pay | Admitting: Internal Medicine

## 2015-07-26 MED ORDER — PRAVASTATIN SODIUM 40 MG PO TABS: ORAL_TABLET | ORAL | Status: DC

## 2015-08-07 ENCOUNTER — Telehealth: Payer: Self-pay

## 2015-08-07 NOTE — Telephone Encounter (Signed)
Patient wants to talk to Dr. Ronnald Ramp or his nurse about a referral that was sent and something about medication, she was not to clear on what she needed. She just wanted to speak with a nurse. Please follow up.

## 2015-08-09 NOTE — Telephone Encounter (Signed)
Her husbands insulin was switched to basaglar from [facility] and then has since been sent to [facility] it was originally sent to [facility] ai am checking on the palliative care request.   Spoke with Verdis Frederickson at palliative Care [phone] ext [phone]. She is going to fax a note from pt visit on 5/15, informed insulin not being given during visit wife confused during visits and needed a poc. She is going to get with assistant that goes out to home and they will call the office back. Insulin will be administered on days that they are out there.

## 2015-09-07 ENCOUNTER — Encounter (HOSPITAL_COMMUNITY): Payer: Self-pay | Admitting: Nurse Practitioner

## 2015-09-07 ENCOUNTER — Ambulatory Visit (HOSPITAL_COMMUNITY)
Admission: EM | Admit: 2015-09-07 | Discharge: 2015-09-07 | Disposition: A | Discharge status: Home / Self Care | Payer: Medicare Other | Attending: Family Medicine | Admitting: Family Medicine

## 2015-09-07 DIAGNOSIS — T148 Other injury of unspecified body region: Secondary | ICD-10-CM

## 2015-09-07 DIAGNOSIS — W57XXXA Bitten or stung by nonvenomous insect and other nonvenomous arthropods, initial encounter: Secondary | ICD-10-CM | POA: Diagnosis not present

## 2015-09-07 MED ORDER — FLUTICASONE PROPIONATE 0.05 % EX CREA: TOPICAL_CREAM | Freq: Two times a day (BID) | CUTANEOUS | Status: DC

## 2015-09-07 NOTE — ED Notes (Signed)
She c/o several week history of rash to R elbow. Initially she had similar rash to R finger which resolved but then she noticed it on her R elbow. She has multiple itchy raised red bumps to elbow, states they are warm to touch. She is alert and breathing easily

## 2015-09-07 NOTE — ED Provider Notes (Signed)
CSN: [id number]     Arrival date & time 09/07/15  1605 History   First MD Initiated Contact with Patient 09/07/15 1654     Chief Complaint  Patient presents with  . Rash   (Consider location/radiation/quality/duration/timing/severity/associated sxs/prior Treatment) Patient is a 82 y.o. female presenting with rash. The history is provided by the patient and a relative.  Rash Location:  Shoulder/arm Shoulder/arm rash location:  R elbow Quality: itchiness and redness   Onset quality:  Sudden Duration:  2 days Progression:  Improving Chronicity:  New Context: insect bite/sting     Past Medical History  Diagnosis Date  . VITAMIN D DEFICIENCY 01/25/2009  . HYPERLIPIDEMIA 12/14/2006  . BLINDNESS, [place], [place] DEFINITION 12/14/2006  . EXTERNAL HEMORRHOIDS 09/30/2007  . GERD 10/01/2007  . CONSTIPATION 12/14/2006  . RECTAL BLEEDING 10/01/2007  . OSTEOPENIA 02/07/2008  . OOPHORECTOMY, RIGHT, HX OF 12/14/2006  . ROTATOR CUFF REPAIR, RIGHT, HX OF 12/14/2006  . Anemia   . Arthritis   . Cancer (HCC)     breast  . Edema   . Blind    Past Surgical History  Procedure Laterality Date  . Rotator cuff repair      right  . Right oophorectomy    . Irrigation and debridement sebaceous cyst  06/07    [name]  . Tubal ligation    . Le art doppler   '98    normal  . Nuc stress test   07/07    normal study  . Abdominal hysterectomy    . Partial mastectomy with needle localization Left 05/11/2013    Procedure: PARTIAL MASTECTOMY WITH NEEDLE LOCALIZATION;  Surgeon: [name], MD;  Location: [facility];  Service: General;  Laterality: Left;   Family History  Problem Relation Age of Onset  . Hypertension Father   . Dementia Father   . Cancer Neg Hx     Negative for breast or colon cancer  . Heart disease Neg Hx    Social History  Substance Use Topics  . Smoking status: Never Smoker   . Smokeless tobacco: None  . Alcohol Use: No   OB History    No data available      Review of Systems  Skin: Positive for rash.  All other systems reviewed and are negative.   Allergies  Review of patient's allergies indicates no known allergies.  Home Medications   Prior to Admission medications   Medication Sig Start Date End Date Taking? Authorizing Provider  tamoxifen (NOLVADEX) 10 MG tablet Take 10 mg by mouth 2 (two) times daily.   Yes Historical Provider, MD  hydrochlorothiazide (HYDRODIURIL) 25 MG tablet TAKE 1 TABLET BY MOUTH AS NEEDED Patient not taking: Reported on 07/10/2015 09/08/13   Janith Lima, MD  ibuprofen (ADVIL) 200 MG tablet Take 200 mg by mouth as needed.      Historical Provider, MD  pravastatin (PRAVACHOL) 40 MG tablet TAKE 1 TABLET BY MOUTH AT BEDTIME 07/26/15   Janith Lima, MD  ranitidine (ZANTAC) 150 MG tablet Take 150 mg by mouth daily as needed.     Historical Provider, MD  tamoxifen (NOLVADEX) 20 MG tablet Take 1 tablet (20 mg total) by mouth daily. 07/06/15   Chauncey Cruel, MD   Meds Ordered and Administered this Visit  Medications - No data to display  BP 152/85 mmHg  Pulse 87  Temp(Src) 98.5 F (36.9 C) (Oral)  Resp 17  SpO2 100% No data found.   Physical  Exam  Constitutional: She is oriented to person, place, and time. She appears well-developed and well-nourished. No distress.  Neck: Normal range of motion. Neck supple.  Musculoskeletal: She exhibits no tenderness.  Neurological: She is alert and oriented to person, place, and time.  Skin: Skin is warm and dry. Rash noted. There is erythema.  5 linear oriented separate erythematous lesions pruritic to medial right elbow. nonpustular.  Nursing note and vitals reviewed.   ED Course  Procedures (including critical care time)  Labs Review Labs Reviewed - No data to display  Imaging Review No results found.   Visual Acuity Review  Right Eye Distance:   Left Eye Distance:   Bilateral Distance:    Right Eye Near:   Left Eye Near:    Bilateral Near:          MDM  No diagnosis found.     Billy Fischer, MD 09/07/15 [phone]

## 2015-10-17 ENCOUNTER — Other Ambulatory Visit: Payer: Self-pay | Admitting: Internal Medicine

## 2015-10-25 ENCOUNTER — Ambulatory Visit: Payer: Medicare Other | Admitting: Internal Medicine

## 2015-11-01 ENCOUNTER — Ambulatory Visit (INDEPENDENT_AMBULATORY_CARE_PROVIDER_SITE_OTHER): Payer: Medicare Other | Admitting: Internal Medicine

## 2015-11-01 ENCOUNTER — Other Ambulatory Visit: Payer: Self-pay | Admitting: Internal Medicine

## 2015-11-01 ENCOUNTER — Encounter: Payer: Self-pay | Admitting: Internal Medicine

## 2015-11-01 ENCOUNTER — Ambulatory Visit (INDEPENDENT_AMBULATORY_CARE_PROVIDER_SITE_OTHER)
Admission: RE | Admit: 2015-11-01 | Discharge: 2015-11-01 | Disposition: A | Discharge status: Home / Self Care | Payer: Medicare Other | Source: Ambulatory Visit | Attending: Internal Medicine | Admitting: Internal Medicine

## 2015-11-01 VITALS — BP 134/80 | HR 75 | Temp 98.4°F | Resp 16 | Ht 62.0 in | Wt 180.8 lb

## 2015-11-01 DIAGNOSIS — M25551 Pain in right hip: Secondary | ICD-10-CM

## 2015-11-01 DIAGNOSIS — L500 Allergic urticaria: Secondary | ICD-10-CM | POA: Diagnosis not present

## 2015-11-01 DIAGNOSIS — I10 Essential (primary) hypertension: Secondary | ICD-10-CM

## 2015-11-01 DIAGNOSIS — M25559 Pain in unspecified hip: Secondary | ICD-10-CM | POA: Insufficient documentation

## 2015-11-01 DIAGNOSIS — M1611 Unilateral primary osteoarthritis, right hip: Secondary | ICD-10-CM | POA: Diagnosis not present

## 2015-11-01 MED ORDER — DOXEPIN HCL 3 MG PO TABS: 6.0000 mg | ORAL_TABLET | Freq: Every day | ORAL | 11 refills | Status: DC

## 2015-11-01 NOTE — Patient Instructions (Signed)
Hives Hives are itchy, red, swollen areas of the skin. They can vary in size and location on your body. Hives can come and go for hours or several days (acute hives) or for several weeks (chronic hives). Hives do not spread from person to person (noncontagious). They may get worse with scratching, exercise, and emotional stress. CAUSES   Allergic reaction to food, additives, or drugs.  Infections, including the common cold.  Illness, such as vasculitis, lupus, or thyroid disease.  Exposure to sunlight, heat, or cold.  Exercise.  Stress.  Contact with chemicals. SYMPTOMS   Red or white swollen patches on the skin. The patches may change size, shape, and location quickly and repeatedly.  Itching.  Swelling of the hands, feet, and face. This may occur if hives develop deeper in the skin. DIAGNOSIS  Your caregiver can usually tell what is wrong by performing a physical exam. Skin or blood tests may also be done to determine the cause of your hives. In some cases, the cause cannot be determined. TREATMENT  Mild cases usually get better with medicines such as antihistamines. Severe cases may require an emergency epinephrine injection. If the cause of your hives is known, treatment includes avoiding that trigger.  HOME CARE INSTRUCTIONS   Avoid causes that trigger your hives.  Take antihistamines as directed by your caregiver to reduce the severity of your hives. Non-sedating or low-sedating antihistamines are usually recommended. Do not drive while taking an antihistamine.  Take any other medicines prescribed for itching as directed by your caregiver.  Wear loose-fitting clothing.  Keep all follow-up appointments as directed by your caregiver. SEEK MEDICAL CARE IF:   You have persistent or severe itching that is not relieved with medicine.  You have painful or swollen joints. SEEK IMMEDIATE MEDICAL CARE IF:   You have a fever.  Your tongue or lips are swollen.  You have  trouble breathing or swallowing.  You feel tightness in the throat or chest.  You have abdominal pain. These problems may be the first sign of a life-threatening allergic reaction. Call your local emergency services (911 in [place] MAKE SURE YOU:   Understand these instructions.  Will watch your condition.  Will get help right away if you are not doing well or get worse.   This information is not intended to replace advice given to you by your health care provider. Make sure you discuss any questions you have with your health care provider.   Document Released: 03/10/2005 Document Revised: 03/15/2013 Document Reviewed: 06/03/2011 Elsevier Interactive Patient Education 2016 [employer]  

## 2015-11-01 NOTE — Progress Notes (Signed)
Subjective:  Patient ID: [name], female    DOB: 1934-01-01  Age: 82 y.o. MRN: [id number]  CC: Rash and Hip Pain   HPI [name] presents for concerns about a recurrent rash over the last 1-2 months. She is blind but she tells me that people notice a red, blotchy rash over her torso and extremities intermittently. She does tells me it itches. She says the rash is exacerbated by the stress of her husband with dementia. The rash comes and goes from different areas. She was seen in urgent care center and is tried a topical corticosteroid without much relief from the symptoms. The last time she noticed the rash was about a week ago.  She also complains of a several week history of pain around her right lower back and right upper hip. The pain does not radiate into her lower extremities and she denies any paresthesias in her lower extremities.  Outpatient Medications Prior to Visit  Medication Sig Dispense Refill  . ibuprofen (ADVIL) 200 MG tablet Take 200 mg by mouth as needed.      . pravastatin (PRAVACHOL) 40 MG tablet TAKE 1 TABLET BY MOUTH AT BEDTIME 90 tablet 3  . ranitidine (ZANTAC) 150 MG tablet Take 150 mg by mouth daily as needed.     . tamoxifen (NOLVADEX) 20 MG tablet Take 1 tablet (20 mg total) by mouth daily. 90 tablet 5  . fluticasone (CUTIVATE) 0.05 % cream Apply topically 2 (two) times daily. 30 g 0  . hydrochlorothiazide (HYDRODIURIL) 25 MG tablet TAKE 1 TABLET BY MOUTH AS NEEDED (Patient not taking: Reported on 07/10/2015) 30 tablet 5  . pravastatin (PRAVACHOL) 40 MG tablet TAKE 1 TABLET BY MOUTH AT BEDTIME 90 tablet 3  . tamoxifen (NOLVADEX) 10 MG tablet Take 10 mg by mouth 2 (two) times daily.     No facility-administered medications prior to visit.     ROS Review of Systems  Constitutional: Negative.  Negative for appetite change, chills, diaphoresis, fatigue and fever.  HENT: Negative.   Eyes: Negative.   Respiratory: Negative.  Negative for cough,  choking, chest tightness, shortness of breath and stridor.   Cardiovascular: Negative.  Negative for chest pain, palpitations and leg swelling.  Gastrointestinal: Negative.  Negative for abdominal pain, constipation, diarrhea, nausea and vomiting.  Endocrine: Negative.   Genitourinary: Negative.  Negative for dysuria, flank pain and urgency.  Musculoskeletal: Positive for arthralgias and back pain.  Skin: Positive for rash. Negative for color change, pallor and wound.  Allergic/Immunologic: Negative.   Neurological: Negative.   Hematological: Negative.  Negative for adenopathy. Does not bruise/bleed easily.  Psychiatric/Behavioral: Negative.     Objective:  BP 134/80 (BP Location: Left Arm, Patient Position: Sitting, Cuff Size: Normal)   Pulse 75   Temp 98.4 F (36.9 C) (Oral)   Resp 16   Ht 5\' 2"  (1.575 m)   Wt 180 lb 12 oz (82 kg)   SpO2 98%   BMI 33.06 kg/m   BP Readings from Last 3 Encounters:  11/01/15 134/80  09/07/15 152/85  07/10/15 116/80    Wt Readings from Last 3 Encounters:  11/01/15 180 lb 12 oz (82 kg)  07/10/15 177 lb (80.3 kg)  05/09/15 178 lb 6.4 oz (80.9 kg)    Physical Exam  Constitutional: She is oriented to person, place, and time. No distress.  HENT:  Mouth/Throat: Oropharynx is clear and moist. No oropharyngeal exudate.  Eyes: Conjunctivae are normal. Left eye exhibits no discharge.  No scleral icterus.  Neck: Normal range of motion. Neck supple. No JVD present. No tracheal deviation present. No thyromegaly present.  Cardiovascular: Normal rate, regular rhythm, normal heart sounds and intact distal pulses.  Exam reveals no gallop and no friction rub.   No murmur heard. Pulmonary/Chest: Effort normal and breath sounds normal. No stridor. No respiratory distress. She has no wheezes. She has no rales. She exhibits no tenderness.  Abdominal: Soft. Bowel sounds are normal. She exhibits no distension and no mass. There is no tenderness. There is no  rebound and no guarding.  Musculoskeletal: Normal range of motion. She exhibits no edema, tenderness or deformity.       Right hip: Normal.       Lumbar back: Normal.  Lymphadenopathy:    She has no cervical adenopathy.  Neurological: She is oriented to person, place, and time.  Skin: Skin is warm and dry. No rash noted. She is not diaphoretic. No erythema. No pallor.  Vitals reviewed.   Lab Results  Component Value Date   WBC 5.2 05/09/2015   HGB 11.9 05/09/2015   HCT 36.1 05/09/2015   PLT 224 05/09/2015   GLUCOSE 106 05/09/2015   CHOL 170 07/10/2015   TRIG 155.0 (H) 07/10/2015   HDL 60.90 07/10/2015   LDLDIRECT 133.2 09/04/2009   LDLCALC 78 07/10/2015   ALT 14 05/09/2015   AST 22 05/09/2015   NA 141 05/09/2015   K 4.0 05/09/2015   CL 107 07/06/2013   CREATININE 0.9 05/09/2015   BUN 13.9 05/09/2015   CO2 24 05/09/2015   TSH 0.63 07/10/2015   HGBA1C 6.4 07/10/2015    Dg Hip Unilat With Pelvis 2-3 Views Right  Result Date: 11/01/2015 CLINICAL DATA:  Chronic RIGHT hip pain EXAM: DG HIP (WITH OR WITHOUT PELVIS) 2-3V RIGHT COMPARISON:  Non FINDINGS: Marked osseous demineralization. Hip joint spaces preserved. Question minimal sclerosis at the SI joints bilaterally. No acute fracture, dislocation, or bone destruction. Small pelvic phleboliths. Degenerative changes at lumbosacral junction. IMPRESSION: Osseous demineralization with degenerative disc disease changes at the lumbosacral junction and question sacroiliitis. No acute RIGHT hip abnormalities. Electronically Signed   By: Lavonia Dana M.D.   On: 11/01/2015 14:50    No results found.  Assessment & Plan:   [name] was seen today for rash and hip pain.  Diagnoses and all orders for this visit:  Allergic urticaria- will start low-dose doxepin at bedtime to prevent the urticaria from recurring -     Doxepin HCl (SILENOR) 3 MG TABS; Take 2 tablets (6 mg total) by mouth at bedtime.  Hip pain, right- plain film shows  degenerative disc disease in the lumbar spine, will continue ibuprofen as needed for pain. -     Cancel: DG HIP UNILAT WITH PELVIS MIN 4 VIEWS RIGHT; Future  Essential hypertension- her blood pressure is adequately well controlled.   I have discontinued [name]'s hydrochlorothiazide and fluticasone. I am also having her start on Doxepin HCl. Additionally, I am having her maintain her ibuprofen, ranitidine, tamoxifen, and pravastatin.  Meds ordered this encounter  Medications  . Doxepin HCl (SILENOR) 3 MG TABS    Sig: Take 2 tablets (6 mg total) by mouth at bedtime.    Dispense:  60 tablet    Refill:  11     Follow-up: Return if symptoms worsen or fail to improve.  Scarlette Calico, MD

## 2015-11-01 NOTE — Progress Notes (Signed)
Pre visit review using our clinic review tool, if applicable. No additional management support is needed unless otherwise documented below in the visit note. 

## 2015-11-28 ENCOUNTER — Other Ambulatory Visit: Payer: Self-pay | Admitting: Internal Medicine

## 2015-11-28 DIAGNOSIS — L500 Allergic urticaria: Secondary | ICD-10-CM

## 2015-11-28 MED ORDER — DOXEPIN HCL 25 MG PO CAPS: 25.0000 mg | ORAL_CAPSULE | Freq: Every day | ORAL | 3 refills | Status: DC

## 2016-02-20 ENCOUNTER — Ambulatory Visit (INDEPENDENT_AMBULATORY_CARE_PROVIDER_SITE_OTHER): Payer: Medicare Other

## 2016-02-20 DIAGNOSIS — Z23 Encounter for immunization: Secondary | ICD-10-CM

## 2016-04-22 DIAGNOSIS — Z853 Personal history of malignant neoplasm of breast: Secondary | ICD-10-CM | POA: Diagnosis not present

## 2016-04-22 LAB — HM MAMMOGRAPHY

## 2016-04-25 ENCOUNTER — Telehealth: Payer: Self-pay | Admitting: Oncology

## 2016-04-25 NOTE — Telephone Encounter (Signed)
Patient called to schedule annual follow up appointment. Scheduled for next available.

## 2016-05-01 ENCOUNTER — Encounter: Payer: Self-pay | Admitting: Internal Medicine

## 2016-05-01 NOTE — Progress Notes (Signed)
result abstracted

## 2016-05-27 ENCOUNTER — Ambulatory Visit (HOSPITAL_BASED_OUTPATIENT_CLINIC_OR_DEPARTMENT_OTHER): Payer: Medicare Other | Admitting: Oncology

## 2016-05-27 VITALS — BP 129/75 | HR 91 | Temp 99.2°F | Resp 18 | Ht 62.0 in | Wt 178.3 lb

## 2016-05-27 DIAGNOSIS — Z79810 Long term (current) use of selective estrogen receptor modulators (SERMs): Secondary | ICD-10-CM

## 2016-05-27 DIAGNOSIS — C50312 Malignant neoplasm of lower-inner quadrant of left female breast: Secondary | ICD-10-CM | POA: Diagnosis not present

## 2016-05-27 DIAGNOSIS — M8580 Other specified disorders of bone density and structure, unspecified site: Secondary | ICD-10-CM | POA: Diagnosis not present

## 2016-05-27 DIAGNOSIS — Z170 Estrogen receptor positive status [ER+]: Secondary | ICD-10-CM

## 2016-05-27 NOTE — Progress Notes (Signed)
[facility]  [phone] [phone]     ID: [name] OB: Jan 29, 1934  MR#: [id number]  JYN#:[id number]  PCP: Scarlette Calico, MD GYN:  Newton Pigg SU: Fanny Skates OTHER MD: Gery Pray  CHIEF COMPLAINT: estrogen receptor positive breast cancer  CURRENT TREATMENT: Tamoxifen  HISTORY OF PRESENT ILLNESS: From the original intake note:  [name] had routine screening mammography at [facility] 03/19/2012 showing a potential abnormality in the left breast. Additional views 03/26/2012 showed no discrete mass or architectural distortion. Left breast ultrasonography was negative.  Short interval followup was suggested and done 09/30/2012 the patient had left diagnostic mammography which showed no concerning change.  Bilateral diagnostic mammography 03/22/2013 showed the patient's breast density to be category B. There was an irregular focal asymmetry in the left breast at 8:00, however ultrasound of this area showed no corresponding abnormality. Accordingly a stereotactic biopsy of the area in question in the left breast was performed 03/28/2013, and showed (SAA [id number]) an invasive ductal carcinoma, with lobular features, grade 1, estrogen receptor 99% positive, progesterone receptor 100% positive, both with strong staining intensity, with an MIB-1 of 6%, and no HER-2 amplification, the signals ratio being 1.35 and the copy number per cell 1.75.  MRI of the breast was obtained 04/05/2013. This confirmed a 2 cm irregular spiculated mass in the upper inner quadrant of the left breast with no additional areas of concern in that breast. In the right breast however at the 12:00 position there was a 1.3 cm irregular spiculated mass. There were 2 oval enhancing masses in the right breast felt consistent with intramammary lymph nodes. There were no abnormal appearing lymph nodes otherwise.  The patient's subsequent history is as detailed below  INTERVAL HISTORY: [name]  returns today for follow-up of her estrogen receptor positive breast cancer, accompanied by her daughter, [name]. The patient continues on tamoxifen, with good tolerance. Hot flashes are not a problem. She obtains a drug at a good cost.  REVIEW OF SYSTEMS: [name] tells me she is doing "good". Although she is blind, she has her husband's primary caregiver. She walks around the house where she is comfortable and does not fall or bumping into things. She goes out with family as needed. A detailed review of systems today was otherwise noncontributory  PAST MEDICAL HISTORY: Past Medical History:  Diagnosis Date  . Anemia   . Arthritis   . Blind   . BLINDNESS, [place], [place] DEFINITION 12/14/2006  . Cancer ([place])    breast  . CONSTIPATION 12/14/2006  . Edema   . EXTERNAL HEMORRHOIDS 09/30/2007  . GERD 10/01/2007  . HYPERLIPIDEMIA 12/14/2006  . OOPHORECTOMY, RIGHT, HX OF 12/14/2006  . OSTEOPENIA 02/07/2008  . RECTAL BLEEDING 10/01/2007  . ROTATOR CUFF REPAIR, RIGHT, HX OF 12/14/2006  . VITAMIN D DEFICIENCY 01/25/2009    PAST SURGICAL HISTORY: Past Surgical History:  Procedure Laterality Date  . ABDOMINAL HYSTERECTOMY    . IRRIGATION AND DEBRIDEMENT SEBACEOUS CYST  06/07   [name]  . LE Art Doppler   '98   normal  . Nuc Stress test   07/07   normal study  . PARTIAL MASTECTOMY WITH NEEDLE LOCALIZATION Left 05/11/2013   Procedure: PARTIAL MASTECTOMY WITH NEEDLE LOCALIZATION;  Surgeon: [name], MD;  Location: [facility];  Service: General;  Laterality: Left;  . RIGHT OOPHORECTOMY    . ROTATOR CUFF REPAIR     right  . TUBAL LIGATION      FAMILY HISTORY Family History  Problem Relation Age of Onset  . Hypertension Father   . Dementia Father   . Cancer Neg Hx     Negative for breast or colon cancer  . Heart disease Neg Hx    the patient's father was murdered at the age of 63. The patient's mother died in her sleep at the age of 79. The patient has 3 brothers, 6 sisters.  She has one cousin with breast cancer, but does not know at what age she was diagnosed. She also has a cousin with ovarian cancer, but again does not know at what age it was diagnosed.  GYNECOLOGIC HISTORY:  Menarche age 68, first live birth age 15, the patient is [place] P5. She went through the change of life at age 5. She did not use hormone replacement.   SOCIAL HISTORY:  [name] used to work at [employer], but is now retired. Her husband "Buddie" is also blind. He suffers from significant Alzheimer's disease. The patient lives with her husband and 2 grandchildren, Denny Peon, 26, and 66, 32. The patient's children are Sunday Spillers, who works in [place] in Programmer, applications for a Theatre manager; Trilby Drummer who works in [place] as a Curator; Surveyor, mining, who works in [place] in Psychologist, educational; Alfonzo, who also lives in [place]; and Rossie, who is a Education officer, environmental. The patient has 6 grandchildren, four great-grandchildren. She attends a local [place]: Not in place; the patient intends to name her daughter [name] as healthcare power of attorney. [name] can be reached at [place]: Social History  Substance Use Topics  . Smoking status: Never Smoker  . Smokeless tobacco: Not on file  . Alcohol use No     Colonoscopy:  PAP:  Bone density: 04/07/2013 at [facility] showed osteopenia with a T score of -2.2 at the left femoral neck  Lipid panel:  No Known Allergies  Current Outpatient Prescriptions  Medication Sig Dispense Refill  . doxepin (SINEQUAN) 25 MG capsule Take 1 capsule (25 mg total) by mouth at bedtime. 90 capsule 3  . ibuprofen (ADVIL) 200 MG tablet Take 200 mg by mouth as needed.      . pravastatin (PRAVACHOL) 40 MG tablet TAKE 1 TABLET BY MOUTH AT BEDTIME 90 tablet 3  . ranitidine (ZANTAC) 150 MG tablet Take 150 mg by mouth daily as needed.     . tamoxifen (NOLVADEX) 20 MG tablet Take 1 tablet (20 mg total) by mouth daily. 90  tablet 5   No current facility-administered medications for this visit.     OBJECTIVE: Older African American womanIn no acute distress  Vitals:   05/27/16 1414  BP: 129/75  Pulse: 91  Resp: 18  Temp: 99.2 F (37.3 C)     Body mass index is 32.61 kg/m.    ECOG FS:1 - Symptomatic but completely ambulatory  The patient is blind, wearing dark glasses. Oropharynx clear and moist No cervical or supraclavicular adenopathy Lungs no rales or rhonchi Heart regular rate and rhythm Abd soft, nontender, positive bowel sounds MSK no focal spinal tenderness, no upper extremity lymphedema Neuro: nonfocal, well oriented, appropriate affect Breasts: The right breast is benign. The left breast has undergone surgery and radiation with no evidence of local recurrence. Both axillae are benign.  LAB RESULTS:  CMP     Component Value Date/Time   NA 141 05/09/2015 0929   K 4.0 05/09/2015 0929   CL 107 07/06/2013 1135   CO2 24 05/09/2015 0929  GLUCOSE 106 05/09/2015 0929   BUN 13.9 05/09/2015 0929   CREATININE 0.9 05/09/2015 0929   CALCIUM 10.0 05/09/2015 0929   PROT 8.1 05/09/2015 0929   ALBUMIN 4.0 05/09/2015 0929   AST 22 05/09/2015 0929   ALT 14 05/09/2015 0929   ALKPHOS 59 05/09/2015 0929   BILITOT 0.35 05/09/2015 0929   GFRNONAA 62 (L) 05/09/2013 0920   GFRAA 72 (L) 05/09/2013 0920    I No results found for: SPEP  Lab Results  Component Value Date   WBC 5.2 05/09/2015   NEUTROABS 3.1 05/09/2015   HGB 11.9 05/09/2015   HCT 36.1 05/09/2015   MCV 84.7 05/09/2015   PLT 224 05/09/2015      Chemistry      Component Value Date/Time   NA 141 05/09/2015 0929   K 4.0 05/09/2015 0929   CL 107 07/06/2013 1135   CO2 24 05/09/2015 0929   BUN 13.9 05/09/2015 0929   CREATININE 0.9 05/09/2015 0929      Component Value Date/Time   CALCIUM 10.0 05/09/2015 0929   ALKPHOS 59 05/09/2015 0929   AST 22 05/09/2015 0929   ALT 14 05/09/2015 0929   BILITOT 0.35 05/09/2015 0929        No results found for: LABCA2  No components found for: [id number]  No results for input(s): INR in the last 168 hours.  Urinalysis    Component Value Date/Time   COLORURINE YELLOW 07/06/2013 1135   APPEARANCEUR CLEAR 07/06/2013 1135   LABSPEC 1.020 07/06/2013 1135   PHURINE 5.5 07/06/2013 1135   GLUCOSEU NEGATIVE 07/06/2013 1135   HGBUR TRACE-LYSED (A) 07/06/2013 1135   BILIRUBINUR NEGATIVE 07/06/2013 1135   KETONESUR NEGATIVE 07/06/2013 1135   PROTEINUR NEGATIVE 05/09/2013 0848   UROBILINOGEN 0.2 07/06/2013 1135   NITRITE NEGATIVE 07/06/2013 1135   LEUKOCYTESUR NEGATIVE 07/06/2013 1135    STUDIES: Mammography at [facility] 04/22/2016 found a breast density to be category B. There was no evidence of disease recurrence or new cancer developing.  ASSESSMENT: 83 y.o. [place] woman status post left breast biopsy 03/28/2013 for a clinical T1c N0, stage IA invasive (lobular) carcinoma, grade 1, estrogen and progesterone receptor positive, HER-2 negative, with an MIB-1 of 6%  (1) a 1.3 cm lesion noted by MRI in the right breast was proven benign by biopsy 04/20/2013  (2) status post left lumpectomy without salpingo-oophorectomy 05/11/2013 for a pT1c cN0, stage IA invasive ductal carcinoma, grade 1, repeat HER-2 again negative, with ample margins  (3) the patient was evaluated by radiation oncology, who felt if she took anti-estrogens it would be safe for her to forego radiation  (4) tamoxifen started April 2015  (5) osteopenia, T score of -2.25 March 2013, repeat January 2017 was -1.6   PLAN: [place]ream Is now 3 years out from definitive surgery for her breast cancer, with no evidence of disease recurrence. This is very favorable.  She is tolerating tamoxifen well and the plan will be to continue that 2 more years, after which she can be released from follow-up  I am not sure that she can be much more active given her own handicaps but she is her husband's primary caregiver and  that also helps keeps her "in shape".  She will return to see me after her next mammograms a year from now. She knows to call for any problems that may develop before the next visit  MAGRINAT,GUSTAV C, MD   05/27/2016 2:19 PM

## 2016-07-16 DIAGNOSIS — Z01419 Encounter for gynecological examination (general) (routine) without abnormal findings: Secondary | ICD-10-CM | POA: Diagnosis not present

## 2016-07-16 DIAGNOSIS — Z124 Encounter for screening for malignant neoplasm of cervix: Secondary | ICD-10-CM | POA: Diagnosis not present

## 2016-07-22 DIAGNOSIS — Z01419 Encounter for gynecological examination (general) (routine) without abnormal findings: Secondary | ICD-10-CM | POA: Diagnosis not present

## 2016-07-29 ENCOUNTER — Encounter: Payer: Self-pay | Admitting: Internal Medicine

## 2016-07-29 ENCOUNTER — Ambulatory Visit (INDEPENDENT_AMBULATORY_CARE_PROVIDER_SITE_OTHER): Payer: Medicare Other | Admitting: Internal Medicine

## 2016-07-29 ENCOUNTER — Other Ambulatory Visit (INDEPENDENT_AMBULATORY_CARE_PROVIDER_SITE_OTHER): Payer: Medicare Other

## 2016-07-29 VITALS — BP 120/66 | HR 78 | Temp 98.4°F | Resp 16 | Ht 62.0 in | Wt 173.0 lb

## 2016-07-29 DIAGNOSIS — R7303 Prediabetes: Secondary | ICD-10-CM

## 2016-07-29 DIAGNOSIS — Z0000 Encounter for general adult medical examination without abnormal findings: Secondary | ICD-10-CM | POA: Diagnosis not present

## 2016-07-29 DIAGNOSIS — E785 Hyperlipidemia, unspecified: Secondary | ICD-10-CM

## 2016-07-29 DIAGNOSIS — R0609 Other forms of dyspnea: Secondary | ICD-10-CM | POA: Diagnosis not present

## 2016-07-29 DIAGNOSIS — K219 Gastro-esophageal reflux disease without esophagitis: Secondary | ICD-10-CM | POA: Diagnosis not present

## 2016-07-29 DIAGNOSIS — I10 Essential (primary) hypertension: Secondary | ICD-10-CM

## 2016-07-29 LAB — COMPREHENSIVE METABOLIC PANEL
ALT: 9 U/L (ref 0–35)
AST: 16 U/L (ref 0–37)
Albumin: 4.2 g/dL (ref 3.5–5.2)
Alkaline Phosphatase: 46 U/L (ref 39–117)
BILIRUBIN TOTAL: 0.3 mg/dL (ref 0.2–1.2)
BUN: 13 mg/dL (ref 6–23)
CALCIUM: 9.6 mg/dL (ref 8.4–10.5)
CHLORIDE: 107 meq/L (ref 96–112)
CO2: 28 meq/L (ref 19–32)
CREATININE: 0.94 mg/dL (ref 0.40–1.20)
GFR: 73.51 mL/min (ref >60.00)
GLUCOSE: 93 mg/dL (ref 70–99)
Potassium: 4.5 mEq/L (ref 3.5–5.1)
SODIUM: 140 meq/L (ref 135–145)
Total Protein: 7.4 g/dL (ref 6.0–8.3)

## 2016-07-29 LAB — CBC WITH DIFFERENTIAL/PLATELET
BASOS PCT: 1.1 % (ref 0.0–3.0)
Basophils Absolute: 0.1 10*3/uL (ref 0.0–0.1)
EOS ABS: 0.1 10*3/uL (ref 0.0–0.7)
Eosinophils Relative: 0.9 % (ref 0.0–5.0)
HEMATOCRIT: 36.6 % (ref 36.0–46.0)
Hemoglobin: 11.9 g/dL — ABNORMAL LOW (ref 12.0–15.0)
LYMPHS ABS: 2.4 10*3/uL (ref 0.7–4.0)
LYMPHS PCT: 38.1 % (ref 12.0–46.0)
MCHC: 32.5 g/dL (ref 30.0–36.0)
MCV: 85 fl (ref 78.0–100.0)
Monocytes Absolute: 0.4 10*3/uL (ref 0.1–1.0)
Monocytes Relative: 6.1 % (ref 3.0–12.0)
NEUTROS ABS: 3.4 10*3/uL (ref 1.4–7.7)
NEUTROS PCT: 53.8 % (ref 43.0–77.0)
RBC: 4.3 Mil/uL (ref 3.87–5.11)
RDW: 14.4 % (ref 11.5–15.5)
WBC: 6.3 10*3/uL (ref 4.0–10.5)

## 2016-07-29 LAB — LIPID PANEL
CHOL/HDL RATIO: 3
Cholesterol: 159 mg/dL (ref 0–200)
HDL: 58.5 mg/dL (ref >39.00)
LDL Cholesterol: 71 mg/dL (ref 0–99)
NONHDL: 100.15
Triglycerides: 147 mg/dL (ref 0.0–149.0)
VLDL: 29.4 mg/dL (ref 0.0–40.0)

## 2016-07-29 LAB — TSH: TSH: 0.94 u[IU]/mL (ref 0.35–4.50)

## 2016-07-29 NOTE — Progress Notes (Signed)
Subjective:  Patient ID: [name], female    DOB: Jun 18, 1933  Age: 83 y.o. MRN: [id number]  CC: Hypertension; Hyperlipidemia; Gastroesophageal Reflux; and Annual Exam   HPI [name] presents for a CPX.  She complains of a few episodes of dyspnea on exertion over the last few months. She's had no chest pain, diaphoresis, palpitations, syncope, edema, or fatigue. She otherwise feels well and offers no other complaints.  Past Medical History:  Diagnosis Date  . Anemia   . Arthritis   . Blind   . BLINDNESS, [place], [place] DEFINITION 12/14/2006  . Cancer ([place])    breast  . CONSTIPATION 12/14/2006  . Edema   . EXTERNAL HEMORRHOIDS 09/30/2007  . GERD 10/01/2007  . HYPERLIPIDEMIA 12/14/2006  . OOPHORECTOMY, RIGHT, HX OF 12/14/2006  . OSTEOPENIA 02/07/2008  . RECTAL BLEEDING 10/01/2007  . ROTATOR CUFF REPAIR, RIGHT, HX OF 12/14/2006  . VITAMIN D DEFICIENCY 01/25/2009   Past Surgical History:  Procedure Laterality Date  . ABDOMINAL HYSTERECTOMY    . IRRIGATION AND DEBRIDEMENT SEBACEOUS CYST  06/07   [name]  . LE Art Doppler   '98   normal  . Nuc Stress test   07/07   normal study  . PARTIAL MASTECTOMY WITH NEEDLE LOCALIZATION Left 05/11/2013   Procedure: PARTIAL MASTECTOMY WITH NEEDLE LOCALIZATION;  Surgeon: [name], MD;  Location: [facility];  Service: General;  Laterality: Left;  . RIGHT OOPHORECTOMY    . ROTATOR CUFF REPAIR     right  . TUBAL LIGATION      reports that she has never smoked. She has never used smokeless tobacco. She reports that she does not drink alcohol or use drugs. family history includes Dementia in her father; Hypertension in her father. No Known Allergies  Outpatient Medications Prior to Visit  Medication Sig Dispense Refill  . doxepin (SINEQUAN) 25 MG capsule Take 1 capsule (25 mg total) by mouth at bedtime. 90 capsule 3  . pravastatin (PRAVACHOL) 40 MG tablet TAKE 1 TABLET BY MOUTH AT BEDTIME 90 tablet 3  . ranitidine  (ZANTAC) 150 MG tablet Take 150 mg by mouth daily as needed.     . tamoxifen (NOLVADEX) 20 MG tablet Take 1 tablet (20 mg total) by mouth daily. 90 tablet 5  . ibuprofen (ADVIL) 200 MG tablet Take 200 mg by mouth as needed.       No facility-administered medications prior to visit.     ROS Review of Systems  Constitutional: Negative for appetite change, chills, diaphoresis, fatigue, fever and unexpected weight change.  HENT: Negative.  Negative for trouble swallowing.   Eyes: Positive for visual disturbance (blind).  Respiratory: Positive for shortness of breath (DOE). Negative for cough, chest tightness, wheezing and stridor.   Cardiovascular: Negative for chest pain, palpitations and leg swelling.  Gastrointestinal: Negative for abdominal pain, constipation, diarrhea, nausea and vomiting.  Endocrine: Negative.  Negative for polydipsia, polyphagia and polyuria.  Genitourinary: Negative.  Negative for decreased urine volume, difficulty urinating, dysuria, frequency and urgency.  Musculoskeletal: Negative for arthralgias, back pain and myalgias.  Skin: Negative.  Negative for color change and rash.  Allergic/Immunologic: Negative.   Neurological: Negative.  Negative for dizziness and weakness.  Hematological: Negative for adenopathy. Does not bruise/bleed easily.  Psychiatric/Behavioral: Negative.     Objective:  BP 120/66 (BP Location: Left Arm, Patient Position: Sitting, Cuff Size: Large)   Pulse 78   Temp 98.4 F (36.9 C) (Oral)   Resp 16  Ht 5\' 2"  (1.575 m)   Wt 173 lb (78.5 kg)   SpO2 99%   BMI 31.64 kg/m   BP Readings from Last 3 Encounters:  07/29/16 120/66  05/27/16 129/75  11/01/15 134/80    Wt Readings from Last 3 Encounters:  07/29/16 173 lb (78.5 kg)  05/27/16 178 lb 4.8 oz (80.9 kg)  11/01/15 180 lb 12 oz (82 kg)    Physical Exam  Constitutional: She is oriented to person, place, and time.  Non-toxic appearance. She does not have a sickly appearance. She  does not appear ill. No distress.  HENT:  Mouth/Throat: Oropharynx is clear and moist. No oropharyngeal exudate.  Eyes: Conjunctivae are normal. Right eye exhibits no discharge. Left eye exhibits no discharge. No scleral icterus.  Neck: Normal range of motion. Neck supple. No JVD present. No tracheal deviation present. No thyromegaly present.  Cardiovascular: Normal rate, regular rhythm, normal heart sounds and intact distal pulses.  Exam reveals no gallop and no friction rub.   No murmur heard. EKG-  Sinus  Rhythm   PRWP  -  Nonspecific T-abnormality.   ABNORMAL - no change from prior EKG  Pulmonary/Chest: Effort normal and breath sounds normal. No stridor. No respiratory distress. She has no wheezes. She has no rales. She exhibits no tenderness.  Abdominal: Soft. Bowel sounds are normal. She exhibits no distension and no mass. There is no tenderness. There is no rebound and no guarding.  Genitourinary:  Genitourinary Comments: Breast/GU/rectal exams were deferred at her request.  Musculoskeletal: Normal range of motion. She exhibits no edema, tenderness or deformity.  Lymphadenopathy:    She has no cervical adenopathy.  Neurological: She is oriented to person, place, and time.  Skin: Skin is warm and dry. No rash noted. She is not diaphoretic. No erythema. No pallor.  Vitals reviewed.   Lab Results  Component Value Date   WBC 6.3 07/29/2016   HGB 11.9 (L) 07/29/2016   HCT 36.6 07/29/2016   PLT 231.0 Repeated and verified X2. 07/29/2016   GLUCOSE 93 07/29/2016   CHOL 159 07/29/2016   TRIG 147.0 07/29/2016   HDL 58.50 07/29/2016   LDLDIRECT 133.2 09/04/2009   LDLCALC 71 07/29/2016   ALT 9 07/29/2016   AST 16 07/29/2016   NA 140 07/29/2016   K 4.5 07/29/2016   CL 107 07/29/2016   CREATININE 0.94 07/29/2016   BUN 13 07/29/2016   CO2 28 07/29/2016   TSH 0.94 07/29/2016   HGBA1C 6.4 07/10/2015    Dg Hip Unilat With Pelvis 2-3 Views Right  Result Date:  11/01/2015 CLINICAL DATA:  Chronic RIGHT hip pain EXAM: DG HIP (WITH OR WITHOUT PELVIS) 2-3V RIGHT COMPARISON:  Non FINDINGS: Marked osseous demineralization. Hip joint spaces preserved. Question minimal sclerosis at the SI joints bilaterally. No acute fracture, dislocation, or bone destruction. Small pelvic phleboliths. Degenerative changes at lumbosacral junction. IMPRESSION: Osseous demineralization with degenerative disc disease changes at the lumbosacral junction and question sacroiliitis. No acute RIGHT hip abnormalities. Electronically Signed   By: Lavonia Dana M.D.   On: 11/01/2015 14:50    Assessment & Plan:   Aneri was seen today for hypertension, hyperlipidemia, gastroesophageal reflux and annual exam.  Diagnoses and all orders for this visit:  Essential hypertension- her blood pressure is adequately well-controlled, lites and renal function are normal. -     Comprehensive metabolic panel; Future  Hyperlipidemia with target LDL less than 130- she has achieved her LDL goal is doing well on the statin. -  Lipid panel; Future -     TSH; Future  Prediabetes- her A1c is up to 6.4%, she has prediabetes but I do not recommend a medication at this time. -     Comprehensive metabolic panel; Future -     Hemoglobin A1c; Future  Gastroesophageal reflux disease without esophagitis- she has mildly anemic again but otherwise her symptoms are well controlled, will continue the H2 blocker -     CBC with Differential/Platelet; Future  DOE (dyspnea on exertion)- her EKG is negative for LVH or ischemia, her DOE is not c/w angina, it sounds more like it's poor conditioning and obesity. She agrees to work on her lifestyle modifications. If her symptoms worsen or if she develops chest pain she will let me know and we will pursue an ischemia workup. -     EKG 12-Lead   I have discontinued Ms. Dorion's ibuprofen. I am also having her maintain her ranitidine, tamoxifen, pravastatin, and  doxepin.  No orders of the defined types were placed in this encounter.  See AVS for instructions about healthy living and anticipatory guidance.  Follow-up: Return in about 4 months (around 11/29/2016).  Scarlette Calico, MD

## 2016-07-29 NOTE — Progress Notes (Signed)
Pre visit review using our clinic review tool, if applicable. No additional management support is needed unless otherwise documented below in the visit note. 

## 2016-07-29 NOTE — Patient Instructions (Signed)
Health Maintenance, Female Adopting a healthy lifestyle and getting preventive care can go a long way to promote health and wellness. Talk with your health care provider about what schedule of regular examinations is right for you. This is a good chance for you to check in with your provider about disease prevention and staying healthy. In between checkups, there are plenty of things you can do on your own. Experts have done a lot of research about which lifestyle changes and preventive measures are most likely to keep you healthy. Ask your health care provider for more information. Weight and diet Eat a healthy diet  Be sure to include plenty of vegetables, fruits, low-fat dairy products, and lean protein.  Do not eat a lot of foods high in solid fats, added sugars, or salt.  Get regular exercise. This is one of the most important things you can do for your health.  Most adults should exercise for at least 150 minutes each week. The exercise should increase your heart rate and make you sweat (moderate-intensity exercise).  Most adults should also do strengthening exercises at least twice a week. This is in addition to the moderate-intensity exercise. Maintain a healthy weight  Body mass index (BMI) is a measurement that can be used to identify possible weight problems. It estimates body fat based on height and weight. Your health care provider can help determine your BMI and help you achieve or maintain a healthy weight.  For females 76 years of age and older:  A BMI below 18.5 is considered underweight.  A BMI of 18.5 to 24.9 is normal.  A BMI of 25 to 29.9 is considered overweight.  A BMI of 30 and above is considered obese. Watch levels of cholesterol and blood lipids  You should start having your blood tested for lipids and cholesterol at 81 years of age, then have this test every 5 years.  You may need to have your cholesterol levels checked more often if:  Your lipid or  cholesterol levels are high.  You are older than 81 years of age.  You are at high risk for heart disease. Cancer screening Lung Cancer  Lung cancer screening is recommended for adults 64-42 years old who are at high risk for lung cancer because of a history of smoking.  A yearly low-dose CT scan of the lungs is recommended for people who:  Currently smoke.  Have quit within the past 15 years.  Have at least a 30-pack-year history of smoking. A pack year is smoking an average of one pack of cigarettes a day for 1 year.  Yearly screening should continue until it has been 15 years since you quit.  Yearly screening should stop if you develop a health problem that would prevent you from having lung cancer treatment. Breast Cancer  Practice breast self-awareness. This means understanding how your breasts normally appear and feel.  It also means doing regular breast self-exams. Let your health care provider know about any changes, no matter how small.  If you are in your 20s or 30s, you should have a clinical breast exam (CBE) by a health care provider every 1-3 years as part of a regular health exam.  If you are 34 or older, have a CBE every year. Also consider having a breast X-ray (mammogram) every year.  If you have a family history of breast cancer, talk to your health care provider about genetic screening.  If you are at high risk for breast cancer, talk  to your health care provider about having an MRI and a mammogram every year.  Breast cancer gene (BRCA) assessment is recommended for women who have family members with BRCA-related cancers. BRCA-related cancers include:  Breast.  Ovarian.  Tubal.  Peritoneal cancers.  Results of the assessment will determine the need for genetic counseling and BRCA1 and BRCA2 testing. Cervical Cancer  Your health care provider may recommend that you be screened regularly for cancer of the pelvic organs (ovaries, uterus, and vagina).  This screening involves a pelvic examination, including checking for microscopic changes to the surface of your cervix (Pap test). You may be encouraged to have this screening done every 3 years, beginning at age 24.  For women ages 66-65, health care providers may recommend pelvic exams and Pap testing every 3 years, or they may recommend the Pap and pelvic exam, combined with testing for human papilloma virus (HPV), every 5 years. Some types of HPV increase your risk of cervical cancer. Testing for HPV may also be done on women of any age with unclear Pap test results.  Other health care providers may not recommend any screening for nonpregnant women who are considered low risk for pelvic cancer and who do not have symptoms. Ask your health care provider if a screening pelvic exam is right for you.  If you have had past treatment for cervical cancer or a condition that could lead to cancer, you need Pap tests and screening for cancer for at least 20 years after your treatment. If Pap tests have been discontinued, your risk factors (such as having a new sexual partner) need to be reassessed to determine if screening should resume. Some women have medical problems that increase the chance of getting cervical cancer. In these cases, your health care provider may recommend more frequent screening and Pap tests. Colorectal Cancer  This type of cancer can be detected and often prevented.  Routine colorectal cancer screening usually begins at 81 years of age and continues through 81 years of age.  Your health care provider may recommend screening at an earlier age if you have risk factors for colon cancer.  Your health care provider may also recommend using home test kits to check for hidden blood in the stool.  A small camera at the end of a tube can be used to examine your colon directly (sigmoidoscopy or colonoscopy). This is done to check for the earliest forms of colorectal cancer.  Routine  screening usually begins at age 41.  Direct examination of the colon should be repeated every 5-10 years through 81 years of age. However, you may need to be screened more often if early forms of precancerous polyps or small growths are found. Skin Cancer  Check your skin from head to toe regularly.  Tell your health care provider about any new moles or changes in moles, especially if there is a change in a mole's shape or color.  Also tell your health care provider if you have a mole that is larger than the size of a pencil eraser.  Always use sunscreen. Apply sunscreen liberally and repeatedly throughout the day.  Protect yourself by wearing long sleeves, pants, a wide-brimmed hat, and sunglasses whenever you are outside. Heart disease, diabetes, and high blood pressure  High blood pressure causes heart disease and increases the risk of stroke. High blood pressure is more likely to develop in:  People who have blood pressure in the high end of the normal range (130-139/85-89 mm Hg).  People who are overweight or obese.  People who are African American.  If you are 59-24 years of age, have your blood pressure checked every 3-5 years. If you are 34 years of age or older, have your blood pressure checked every year. You should have your blood pressure measured twice-once when you are at a hospital or clinic, and once when you are not at a hospital or clinic. Record the average of the two measurements. To check your blood pressure when you are not at a hospital or clinic, you can use:  An automated blood pressure machine at a pharmacy.  A home blood pressure monitor.  If you are between 29 years and 60 years old, ask your health care provider if you should take aspirin to prevent strokes.  Have regular diabetes screenings. This involves taking a blood sample to check your fasting blood sugar level.  If you are at a normal weight and have a low risk for diabetes, have this test once  every three years after 81 years of age.  If you are overweight and have a high risk for diabetes, consider being tested at a younger age or more often. Preventing infection Hepatitis B  If you have a higher risk for hepatitis B, you should be screened for this virus. You are considered at high risk for hepatitis B if:  You were born in a country where hepatitis B is common. Ask your health care provider which countries are considered high risk.  Your parents were born in a high-risk country, and you have not been immunized against hepatitis B (hepatitis B vaccine).  You have HIV or AIDS.  You use needles to inject street drugs.  You live with someone who has hepatitis B.  You have had sex with someone who has hepatitis B.  You get hemodialysis treatment.  You take certain medicines for conditions, including cancer, organ transplantation, and autoimmune conditions. Hepatitis C  Blood testing is recommended for:  Everyone born from [birth year] through 1963.  Anyone with known risk factors for hepatitis C. Sexually transmitted infections (STIs)  You should be screened for sexually transmitted infections (STIs) including gonorrhea and chlamydia if:  You are sexually active and are younger than 81 years of age.  You are older than 81 years of age and your health care provider tells you that you are at risk for this type of infection.  Your sexual activity has changed since you were last screened and you are at an increased risk for chlamydia or gonorrhea. Ask your health care provider if you are at risk.  If you do not have HIV, but are at risk, it may be recommended that you take a prescription medicine daily to prevent HIV infection. This is called pre-exposure prophylaxis (PrEP). You are considered at risk if:  You are sexually active and do not regularly use condoms or know the HIV status of your partner(s).  You take drugs by injection.  You are sexually active with a partner  who has HIV. Talk with your health care provider about whether you are at high risk of being infected with HIV. If you choose to begin PrEP, you should first be tested for HIV. You should then be tested every 3 months for as long as you are taking PrEP. Pregnancy  If you are premenopausal and you may become pregnant, ask your health care provider about preconception counseling.  If you may become pregnant, take 400 to 800 micrograms (mcg) of folic acid  every day.  If you want to prevent pregnancy, talk to your health care provider about birth control (contraception). Osteoporosis and menopause  Osteoporosis is a disease in which the bones lose minerals and strength with aging. This can result in serious bone fractures. Your risk for osteoporosis can be identified using a bone density scan.  If you are 4 years of age or older, or if you are at risk for osteoporosis and fractures, ask your health care provider if you should be screened.  Ask your health care provider whether you should take a calcium or vitamin D supplement to lower your risk for osteoporosis.  Menopause may have certain physical symptoms and risks.  Hormone replacement therapy may reduce some of these symptoms and risks. Talk to your health care provider about whether hormone replacement therapy is right for you. Follow these instructions at home:  Schedule regular health, dental, and eye exams.  Stay current with your immunizations.  Do not use any tobacco products including cigarettes, chewing tobacco, or electronic cigarettes.  If you are pregnant, do not drink alcohol.  If you are breastfeeding, limit how much and how often you drink alcohol.  Limit alcohol intake to no more than 1 drink per day for nonpregnant women. One drink equals 12 ounces of beer, 5 ounces of wine, or 1 ounces of hard liquor.  Do not use street drugs.  Do not share needles.  Ask your health care provider for help if you need support  or information about quitting drugs.  Tell your health care provider if you often feel depressed.  Tell your health care provider if you have ever been abused or do not feel safe at home. This information is not intended to replace advice given to you by your health care provider. Make sure you discuss any questions you have with your health care provider. Document Released: 09/23/2010 Document Revised: 08/16/2015 Document Reviewed: 12/12/2014 Elsevier Interactive Patient Education  2017 [employer].

## 2016-07-30 ENCOUNTER — Encounter: Payer: Self-pay | Admitting: Internal Medicine

## 2016-07-30 NOTE — Assessment & Plan Note (Signed)

## 2016-08-08 ENCOUNTER — Other Ambulatory Visit: Payer: Self-pay | Admitting: Internal Medicine

## 2016-08-12 ENCOUNTER — Telehealth: Payer: Self-pay | Admitting: *Deleted

## 2016-08-12 MED ORDER — TAMOXIFEN CITRATE 20 MG PO TABS: 20.0000 mg | ORAL_TABLET | Freq: Every day | ORAL | 3 refills | Status: DC

## 2016-08-12 NOTE — Telephone Encounter (Signed)
Refilled medication

## 2016-08-14 ENCOUNTER — Other Ambulatory Visit: Payer: Self-pay | Admitting: *Deleted

## 2016-08-19 ENCOUNTER — Other Ambulatory Visit: Payer: Self-pay

## 2016-08-19 MED ORDER — TAMOXIFEN CITRATE 20 MG PO TABS: 20.0000 mg | ORAL_TABLET | Freq: Every day | ORAL | 3 refills | Status: DC

## 2016-12-08 ENCOUNTER — Other Ambulatory Visit: Payer: Self-pay | Admitting: Internal Medicine

## 2017-02-04 ENCOUNTER — Ambulatory Visit (INDEPENDENT_AMBULATORY_CARE_PROVIDER_SITE_OTHER): Payer: Medicare Other

## 2017-02-04 ENCOUNTER — Encounter: Payer: Self-pay | Admitting: Endocrinology

## 2017-02-04 DIAGNOSIS — Z23 Encounter for immunization: Secondary | ICD-10-CM | POA: Diagnosis not present

## 2017-05-14 DIAGNOSIS — Z853 Personal history of malignant neoplasm of breast: Secondary | ICD-10-CM | POA: Diagnosis not present

## 2017-05-14 DIAGNOSIS — R928 Other abnormal and inconclusive findings on diagnostic imaging of breast: Secondary | ICD-10-CM | POA: Diagnosis not present

## 2017-05-27 ENCOUNTER — Other Ambulatory Visit: Payer: Self-pay | Admitting: *Deleted

## 2017-05-27 DIAGNOSIS — Z170 Estrogen receptor positive status [ER+]: Principal | ICD-10-CM

## 2017-05-27 DIAGNOSIS — C50312 Malignant neoplasm of lower-inner quadrant of left female breast: Secondary | ICD-10-CM

## 2017-05-27 NOTE — Progress Notes (Signed)
[place]  [phone] [phone]     ID: [name] OB: [birth year]  MR#: [id number]  PZW#:[id number]  PCP: Janith Lima, MD GYN:  Newton Pigg SU: Fanny Skates OTHER MD: Gery Pray  CHIEF COMPLAINT: estrogen receptor positive breast cancer  CURRENT TREATMENT: Tamoxifen  HISTORY OF PRESENT ILLNESS: From the original intake note:  [name] had routine screening mammography at [facility] 03/19/2012 showing a potential abnormality in the left breast. Additional views 03/26/2012 showed no discrete mass or architectural distortion. Left breast ultrasonography was negative.  Short interval followup was suggested and done 09/30/2012 the patient had left diagnostic mammography which showed no concerning change.  Bilateral diagnostic mammography 03/22/2013 showed the patient's breast density to be category B. There was an irregular focal asymmetry in the left breast at 8:00, however ultrasound of this area showed no corresponding abnormality. Accordingly a stereotactic biopsy of the area in question in the left breast was performed 03/28/2013, and showed ([id number] [id number]) an invasive ductal carcinoma, with lobular features, grade 1, estrogen receptor 99% positive, progesterone receptor 100% positive, both with strong staining intensity, with an MIB-1 of 6%, and no HER-2 amplification, the signals ratio being 1.35 and the copy number per cell 1.75.  MRI of the breast was obtained 04/05/2013. This confirmed a 2 cm irregular spiculated mass in the upper inner quadrant of the left breast with no additional areas of concern in that breast. In the right breast however at the 12:00 position there was a 1.3 cm irregular spiculated mass. There were 2 oval enhancing masses in the right breast felt consistent with intramammary lymph nodes. There were no abnormal appearing lymph nodes otherwise.  The patient's subsequent history is as detailed below  INTERVAL  HISTORY: [name] returns today for follow-up of her estrogen receptor positive breast cancer accompanied by her daughter. She continues on tamoxifen, with good tolerance. She has occasional hot flashes that haven't changed since being on tamoxifen. She denies issues with increased vaginal discharge.   Since her last visit, she underwent diagnostic bilateral mammography with CAD and tomography on 05/14/2017 at [facility] showing: breast density category B. There was no evidence of malignancy.    REVIEW OF SYSTEMS: [name] reports that everything is going well. She says that her husband is doing well. She notes that she has to urinate often after taking tamoxifen. She says that the water that she drinks goes right through her. She notes that she does a lot of walking around her house and she has been taking care of her husband due to his medical issues. She denies unusual headaches, visual changes, nausea, vomiting, or dizziness. There has been no unusual cough, phlegm production, or pleurisy. This been no change in bowel or bladder habits. She denies unexplained fatigue or unexplained weight loss, bleeding, rash, or fever. A detailed review of systems was otherwise stable.    PAST MEDICAL HISTORY: Past Medical History:  Diagnosis Date  . Anemia   . Arthritis   . Blind   . BLINDNESS, [place], [place] DEFINITION 12/14/2006  . Cancer ([place])    breast  . CONSTIPATION 12/14/2006  . Edema   . EXTERNAL HEMORRHOIDS 09/30/2007  . GERD 10/01/2007  . HYPERLIPIDEMIA 12/14/2006  . OOPHORECTOMY, RIGHT, HX OF 12/14/2006  . OSTEOPENIA 02/07/2008  . RECTAL BLEEDING 10/01/2007  . ROTATOR CUFF REPAIR, RIGHT, HX OF 12/14/2006  . VITAMIN D DEFICIENCY 01/25/2009    PAST SURGICAL HISTORY: Past Surgical History:  Procedure Laterality Date  . ABDOMINAL HYSTERECTOMY    .  IRRIGATION AND DEBRIDEMENT SEBACEOUS CYST  06/07   [name]  . LE Art Doppler   '98   normal  . Nuc Stress test   07/07   normal study  . PARTIAL MASTECTOMY WITH  NEEDLE LOCALIZATION Left 05/11/2013   Procedure: PARTIAL MASTECTOMY WITH NEEDLE LOCALIZATION;  Surgeon: [name], MD;  Location: [facility];  Service: General;  Laterality: Left;  . RIGHT OOPHORECTOMY    . ROTATOR CUFF REPAIR     right  . TUBAL LIGATION      FAMILY HISTORY Family History  Problem Relation Age of Onset  . Hypertension Father   . Dementia Father   . Cancer Neg Hx        Negative for breast or colon cancer  . Heart disease Neg Hx    the patient's father was murdered at the age of 41. The patient's mother died in her sleep at the age of 52. The patient has 3 brothers, 6 sisters. She has one cousin with breast cancer, but does not know at what age she was diagnosed. She also has a cousin with ovarian cancer, but again does not know at what age it was diagnosed.  GYNECOLOGIC HISTORY:  Menarche age 67, first live birth age 24, the patient is [name]. She went through the change of life at age 64. She did not use hormone replacement.   SOCIAL HISTORY:  [name] used to work at [employer], but is now retired. Her husband "Buddie" is also blind. He suffers from significant Alzheimer's disease. The patient lives with her husband and 2 grandchildren, Denny Peon, 71, and 43, 32. The patient's children are [name], who works in [place] in Programmer, applications for a Theatre manager; [name] who works in [place] as a Curator; Surveyor, mining, who works in [place] in Psychologist, educational; Alfonzo, who also lives in [place]; and [place], who is a Education officer, environmental. The patient has 6 grandchildren, four great-grandchildren. She attends a local [place]: Not in place; the patient intends to name her daughter Peter Congo as healthcare power of attorney. Peter Congo can be reached at [place]: Social History   Tobacco Use  . Smoking status: Never Smoker  . Smokeless tobacco: Never Used  Substance Use Topics  . Alcohol use: No  . Drug  use: No     Colonoscopy:  PAP:  Bone density: 04/07/2013 at [facility] showed osteopenia with a T score of -2.2 at the left femoral neck  Lipid panel:  No Known Allergies  Current Outpatient Medications  Medication Sig Dispense Refill  . doxepin (SINEQUAN) 25 MG capsule Take 1 capsule (25 mg total) by mouth at bedtime. 90 capsule 3  . pravastatin (PRAVACHOL) 40 MG tablet TAKE 1 TABLET BY MOUTH AT BEDTIME 90 tablet 3  . pravastatin (PRAVACHOL) 40 MG tablet TAKE 1 TABLET BY MOUTH AT  BEDTIME 90 tablet 1  . pravastatin (PRAVACHOL) 40 MG tablet TAKE 1 TABLET BY MOUTH AT  BEDTIME 90 tablet 1  . ranitidine (ZANTAC) 150 MG tablet Take 150 mg by mouth daily as needed.     . tamoxifen (NOLVADEX) 20 MG tablet Take 1 tablet (20 mg total) by mouth daily. 90 tablet 3   No current facility-administered medications for this visit.     OBJECTIVE: Older African American woman in no acute distress  Vitals:   05/28/17 1309  BP: 109/76  Pulse: 87  Resp: 18  Temp: 98.6 F (37 C)  SpO2: 100%     Body mass index is 31.35 kg/m.    ECOG FS:0 - Asymptomatic  The patient is blind.  She wears dark glasses. No cervical or supraclavicular adenopathy Lungs no rales or rhonchi Heart regular rate and rhythm Abd soft, nontender, positive bowel sounds MSK no focal spinal tenderness, no upper extremity lymphedema Neuro: nonfocal, well oriented, appropriate affect Breasts: The right breast is unremarkable.  The left breast is status post lumpectomy followed by radiation.  There is no evidence of local recurrence.  Both axillae are benign.  LAB RESULTS:  CMP     Component Value Date/Time   NA 140 07/29/2016 1429   NA 141 05/09/2015 0929   K 4.5 07/29/2016 1429   K 4.0 05/09/2015 0929   CL 107 07/29/2016 1429   CO2 28 07/29/2016 1429   CO2 24 05/09/2015 0929   GLUCOSE 93 07/29/2016 1429   GLUCOSE 106 05/09/2015 0929   BUN 13 07/29/2016 1429   BUN 13.9 05/09/2015 0929   CREATININE 0.94 07/29/2016 1429    CREATININE 0.9 05/09/2015 0929   CALCIUM 9.6 07/29/2016 1429   CALCIUM 10.0 05/09/2015 0929   PROT 7.4 07/29/2016 1429   PROT 8.1 05/09/2015 0929   ALBUMIN 4.2 07/29/2016 1429   ALBUMIN 4.0 05/09/2015 0929   AST 16 07/29/2016 1429   AST 22 05/09/2015 0929   ALT 9 07/29/2016 1429   ALT 14 05/09/2015 0929   ALKPHOS 46 07/29/2016 1429   ALKPHOS 59 05/09/2015 0929   BILITOT 0.3 07/29/2016 1429   BILITOT 0.35 05/09/2015 0929   GFRNONAA 62 (L) 05/09/2013 0920   GFRAA 72 (L) 05/09/2013 0920    I No results found for: SPEP  Lab Results  Component Value Date   WBC 5.2 05/28/2017   NEUTROABS 3.3 05/28/2017   HGB 11.9 (L) 07/29/2016   HCT 36.1 05/28/2017   MCV 85.8 05/28/2017   PLT 226 05/28/2017      Chemistry      Component Value Date/Time   NA 140 07/29/2016 1429   NA 141 05/09/2015 0929   K 4.5 07/29/2016 1429   K 4.0 05/09/2015 0929   CL 107 07/29/2016 1429   CO2 28 07/29/2016 1429   CO2 24 05/09/2015 0929   BUN 13 07/29/2016 1429   BUN 13.9 05/09/2015 0929   CREATININE 0.94 07/29/2016 1429   CREATININE 0.9 05/09/2015 0929      Component Value Date/Time   CALCIUM 9.6 07/29/2016 1429   CALCIUM 10.0 05/09/2015 0929   ALKPHOS 46 07/29/2016 1429   ALKPHOS 59 05/09/2015 0929   AST 16 07/29/2016 1429   AST 22 05/09/2015 0929   ALT 9 07/29/2016 1429   ALT 14 05/09/2015 0929   BILITOT 0.3 07/29/2016 1429   BILITOT 0.35 05/09/2015 0929       No results found for: LABCA2  No components found for: LABCA125  No results for input(s): INR in the last 168 hours.  Urinalysis    Component Value Date/Time   COLORURINE YELLOW 07/06/2013 1135   APPEARANCEUR CLEAR 07/06/2013 1135   LABSPEC 1.020 07/06/2013 1135   PHURINE 5.5 07/06/2013 1135   GLUCOSEU NEGATIVE 07/06/2013 1135   HGBUR TRACE-LYSED (A) 07/06/2013 1135   BILIRUBINUR NEGATIVE 07/06/2013 1135   KETONESUR NEGATIVE 07/06/2013 1135   PROTEINUR NEGATIVE 05/09/2013 0848   UROBILINOGEN 0.2 07/06/2013 1135    NITRITE NEGATIVE 07/06/2013 1135   LEUKOCYTESUR NEGATIVE 07/06/2013 1135    STUDIES: Since her last visit, she underwent diagnostic  bilateral mammography with CAD and tomography on 05/14/2017 at [facility] showing: breast density category B. There was no evidence of malignancy.   ASSESSMENT: 84 y.o. [place] woman status post left breast biopsy 03/28/2013 for a clinical T1c N0, stage IA invasive (lobular) carcinoma, grade 1, estrogen and progesterone receptor positive, HER-2 negative, with an MIB-1 of 6%  (1) a 1.3 cm lesion noted by MRI in the right breast was proven benign by biopsy 04/20/2013  (2) status post left lumpectomy without salpingo-oophorectomy 05/11/2013 for a pT1c cN0, stage IA invasive ductal carcinoma, grade 1, repeat HER-2 again negative, with ample margins  (3) the patient was evaluated by radiation oncology, who felt if she took anti-estrogens it would be safe for her to forego radiation  (4) tamoxifen started April 2015  (5) osteopenia, T score of -2.25 March 2013, repeat January 2017 was -1.6   PLAN: Roneisha is now 4 years out from definitive surgery for her breast cancer with no evidence of disease recurrence.  This is very favorable.  She is tolerating tamoxifen well.  Plan will be to continue that for a total of 1 more year.  I wrote her a prescription for additional bra since she is having some persistent discomfort in the left breast associated with her prior surgery and radiation and her current bra apparently does not fit well  She will see me one last time a year from now at which point she will be ready to "graduate".  She knows to call for any problems that may develop before that visit.     Magrinat, Virgie Dad, MD  05/28/17 1:39 PM Medical Oncology and Hematology [facility] [address] [place], [place] [address] Tel. [phone]    Fax. [phone]  This document serves as a record of services personally performed by Lurline Del, MD. It was created on his behalf by Sheron Nightingale, a trained medical scribe. The creation of this record is based on the scribe's personal observations and the provider's statements to them.   I have reviewed the above documentation for accuracy and completeness, and I agree with the above.

## 2017-05-28 ENCOUNTER — Inpatient Hospital Stay: Payer: Medicare Other

## 2017-05-28 ENCOUNTER — Inpatient Hospital Stay: Payer: Medicare Other | Attending: Oncology | Admitting: Oncology

## 2017-05-28 ENCOUNTER — Telehealth: Payer: Self-pay | Admitting: Oncology

## 2017-05-28 VITALS — BP 109/76 | HR 87 | Temp 98.6°F | Resp 18 | Ht 62.0 in | Wt 171.4 lb

## 2017-05-28 DIAGNOSIS — Z170 Estrogen receptor positive status [ER+]: Secondary | ICD-10-CM | POA: Diagnosis not present

## 2017-05-28 DIAGNOSIS — C50312 Malignant neoplasm of lower-inner quadrant of left female breast: Secondary | ICD-10-CM

## 2017-05-28 DIAGNOSIS — M8580 Other specified disorders of bone density and structure, unspecified site: Secondary | ICD-10-CM

## 2017-05-28 LAB — CMP (CANCER CENTER ONLY)
ALBUMIN: 4 g/dL (ref 3.5–5.0)
ALK PHOS: 58 U/L (ref 40–150)
ALT: 10 U/L (ref 0–55)
ANION GAP: 9 (ref 3–11)
AST: 18 U/L (ref 5–34)
BILIRUBIN TOTAL: 0.4 mg/dL (ref 0.2–1.2)
BUN: 12 mg/dL (ref 7–26)
CALCIUM: 9.6 mg/dL (ref 8.4–10.4)
CO2: 26 mmol/L (ref 22–29)
Chloride: 107 mmol/L (ref 98–109)
Creatinine: 0.91 mg/dL (ref 0.60–1.10)
GFR, EST NON AFRICAN AMERICAN: 58 mL/min — AB (ref >60)
Glucose, Bld: 103 mg/dL (ref 70–140)
POTASSIUM: 4.3 mmol/L (ref 3.5–5.1)
Sodium: 142 mmol/L (ref 136–145)
TOTAL PROTEIN: 7.9 g/dL (ref 6.4–8.3)

## 2017-05-28 LAB — CBC WITH DIFFERENTIAL (CANCER CENTER ONLY)
BASOS ABS: 0 10*3/uL (ref 0.0–0.1)
BASOS PCT: 1 %
Eosinophils Absolute: 0 10*3/uL (ref 0.0–0.5)
Eosinophils Relative: 1 %
HEMATOCRIT: 36.1 % (ref 34.8–46.6)
Hemoglobin: 11.5 g/dL — ABNORMAL LOW (ref 11.6–15.9)
LYMPHS PCT: 30 %
Lymphs Abs: 1.5 10*3/uL (ref 0.9–3.3)
MCH: 27.4 pg (ref 25.1–34.0)
MCHC: 32 g/dL (ref 31.5–36.0)
MCV: 85.8 fL (ref 79.5–101.0)
Monocytes Absolute: 0.3 10*3/uL (ref 0.1–0.9)
Monocytes Relative: 5 %
NEUTROS ABS: 3.3 10*3/uL (ref 1.5–6.5)
NEUTROS PCT: 63 %
Platelet Count: 226 10*3/uL (ref 145–400)
RBC: 4.2 MIL/uL (ref 3.70–5.45)
RDW: 14.3 % (ref 11.2–14.5)
WBC: 5.2 10*3/uL (ref 3.9–10.3)

## 2017-05-28 NOTE — Telephone Encounter (Signed)
Gave avs and calendar for march 2020 °

## 2017-06-10 ENCOUNTER — Other Ambulatory Visit: Payer: Self-pay | Admitting: Internal Medicine

## 2017-06-12 ENCOUNTER — Telehealth: Payer: Self-pay | Admitting: Internal Medicine

## 2017-06-12 ENCOUNTER — Other Ambulatory Visit: Payer: Self-pay | Admitting: Internal Medicine

## 2017-06-12 NOTE — Telephone Encounter (Signed)
Filled in another encounter

## 2017-06-12 NOTE — Telephone Encounter (Signed)
Copied from [place] [phone]. Topic: Quick Communication - Rx Refill/Question >> Jun 12, 2017  3:16 PM Oliver Pila B wrote: Medication: pravastatin (PRAVACHOL) 40 MG tablet [[id number]] Has the patient contacted their pharmacy? Yes (Agent: If no, request that the patient contact the pharmacy for the refill.) Preferred Pharmacy (with phone number or street name): [facility] Agent: Please be advised that RX refills may take up to 3 business days. We ask that you follow-up with your pharmacy.

## 2017-06-17 MED ORDER — PRAVASTATIN SODIUM 40 MG PO TABS: 40.0000 mg | ORAL_TABLET | Freq: Every day | ORAL | 1 refills | Status: DC

## 2017-06-17 NOTE — Addendum Note (Signed)
Addended by: Karle Barr on: 06/17/2017 05:01 PM   Modules accepted: Orders

## 2017-06-17 NOTE — Telephone Encounter (Signed)
Pt called and states the pharmacy has not received the request, and if you check the receipt confirmations they both have failed, pharmacist told pt to have the office to call the pharmacy to get the Rx filled

## 2017-06-18 NOTE — Telephone Encounter (Signed)
Recent yesterday and pharmacy confirmation received.

## 2017-07-20 DIAGNOSIS — Z01419 Encounter for gynecological examination (general) (routine) without abnormal findings: Secondary | ICD-10-CM | POA: Diagnosis not present

## 2017-07-20 DIAGNOSIS — K623 Rectal prolapse: Secondary | ICD-10-CM | POA: Diagnosis not present

## 2017-07-30 DIAGNOSIS — C50912 Malignant neoplasm of unspecified site of left female breast: Secondary | ICD-10-CM | POA: Diagnosis not present

## 2017-08-03 ENCOUNTER — Encounter: Payer: Medicare Other | Admitting: Internal Medicine

## 2017-08-06 DIAGNOSIS — C50912 Malignant neoplasm of unspecified site of left female breast: Secondary | ICD-10-CM | POA: Diagnosis not present

## 2017-08-11 ENCOUNTER — Encounter: Payer: Self-pay | Admitting: Internal Medicine

## 2017-08-11 ENCOUNTER — Ambulatory Visit (INDEPENDENT_AMBULATORY_CARE_PROVIDER_SITE_OTHER): Payer: Medicare Other | Admitting: Internal Medicine

## 2017-08-11 ENCOUNTER — Other Ambulatory Visit (INDEPENDENT_AMBULATORY_CARE_PROVIDER_SITE_OTHER): Payer: Medicare Other

## 2017-08-11 VITALS — BP 112/78 | HR 92 | Temp 97.8°F | Resp 16 | Ht 62.0 in | Wt 168.0 lb

## 2017-08-11 DIAGNOSIS — I10 Essential (primary) hypertension: Secondary | ICD-10-CM | POA: Diagnosis not present

## 2017-08-11 DIAGNOSIS — H6121 Impacted cerumen, right ear: Secondary | ICD-10-CM

## 2017-08-11 DIAGNOSIS — E785 Hyperlipidemia, unspecified: Secondary | ICD-10-CM | POA: Diagnosis not present

## 2017-08-11 DIAGNOSIS — Z0000 Encounter for general adult medical examination without abnormal findings: Secondary | ICD-10-CM | POA: Diagnosis not present

## 2017-08-11 DIAGNOSIS — R7303 Prediabetes: Secondary | ICD-10-CM | POA: Diagnosis not present

## 2017-08-11 DIAGNOSIS — E559 Vitamin D deficiency, unspecified: Secondary | ICD-10-CM

## 2017-08-11 LAB — COMPREHENSIVE METABOLIC PANEL
ALK PHOS: 50 U/L (ref 39–117)
ALT: 8 U/L (ref 0–35)
AST: 14 U/L (ref 0–37)
Albumin: 4.1 g/dL (ref 3.5–5.2)
BUN: 13 mg/dL (ref 6–23)
CHLORIDE: 107 meq/L (ref 96–112)
CO2: 26 mEq/L (ref 19–32)
Calcium: 9.4 mg/dL (ref 8.4–10.5)
Creatinine, Ser: 0.75 mg/dL (ref 0.40–1.20)
GFR: 95.14 mL/min (ref >60.00)
Glucose, Bld: 119 mg/dL — ABNORMAL HIGH (ref 70–99)
POTASSIUM: 4.1 meq/L (ref 3.5–5.1)
SODIUM: 142 meq/L (ref 135–145)
Total Bilirubin: 0.3 mg/dL (ref 0.2–1.2)
Total Protein: 7.4 g/dL (ref 6.0–8.3)

## 2017-08-11 LAB — HEMOGLOBIN A1C: HEMOGLOBIN A1C: 6.3 % (ref 4.6–6.5)

## 2017-08-11 LAB — CBC WITH DIFFERENTIAL/PLATELET
BASOS PCT: 1.3 % (ref 0.0–3.0)
Basophils Absolute: 0.1 10*3/uL (ref 0.0–0.1)
EOS PCT: 1.1 % (ref 0.0–5.0)
Eosinophils Absolute: 0.1 10*3/uL (ref 0.0–0.7)
HCT: 35.4 % — ABNORMAL LOW (ref 36.0–46.0)
Hemoglobin: 11.5 g/dL — ABNORMAL LOW (ref 12.0–15.0)
LYMPHS ABS: 2 10*3/uL (ref 0.7–4.0)
Lymphocytes Relative: 35.6 % (ref 12.0–46.0)
MCHC: 32.5 g/dL (ref 30.0–36.0)
MCV: 86.2 fl (ref 78.0–100.0)
MONO ABS: 0.4 10*3/uL (ref 0.1–1.0)
Monocytes Relative: 7.7 % (ref 3.0–12.0)
NEUTROS ABS: 3 10*3/uL (ref 1.4–7.7)
NEUTROS PCT: 54.3 % (ref 43.0–77.0)
Platelets: 213 10*3/uL (ref 150.0–400.0)
RBC: 4.1 Mil/uL (ref 3.87–5.11)
RDW: 14.8 % (ref 11.5–15.5)
WBC: 5.6 10*3/uL (ref 4.0–10.5)

## 2017-08-11 LAB — LDL CHOLESTEROL, DIRECT: LDL DIRECT: 82 mg/dL

## 2017-08-11 LAB — LIPID PANEL
CHOLESTEROL: 177 mg/dL (ref 0–200)
HDL: 63.7 mg/dL (ref >39.00)
NonHDL: 112.99
TRIGLYCERIDES: 217 mg/dL — AB (ref 0.0–149.0)
Total CHOL/HDL Ratio: 3
VLDL: 43.4 mg/dL — ABNORMAL HIGH (ref 0.0–40.0)

## 2017-08-11 LAB — VITAMIN D 25 HYDROXY (VIT D DEFICIENCY, FRACTURES): VITD: 9.81 ng/mL — AB (ref 30.00–100.00)

## 2017-08-11 LAB — TSH: TSH: 1.19 u[IU]/mL (ref 0.35–4.50)

## 2017-08-11 MED ORDER — CHOLECALCIFEROL 50 MCG (2000 UT) PO TABS: 2.0000 | ORAL_TABLET | Freq: Every day | ORAL | 1 refills | Status: DC

## 2017-08-11 NOTE — Progress Notes (Signed)
Subjective:  Patient ID: [name], female    DOB: [birth year]  Age: 84 y.o. MRN: [id number]  CC: Hypertension; Hyperlipidemia; and Annual Exam   HPI [name] presents for a CPX.  She complains of an occasional joint pain for which she takes Advil and gets symptom relief.  She denies any recent episodes of headache, chest pain, shortness of breath, DOE, palpitations, edema, or fatigue.  She complains of a muffled sensation in her right ear with a mild decrease in hearing on the right side.  Past Medical History:  Diagnosis Date  . Anemia   . Arthritis   . Blind   . BLINDNESS, [place], [place] DEFINITION 12/14/2006  . Cancer ([place])    breast  . CONSTIPATION 12/14/2006  . Edema   . EXTERNAL HEMORRHOIDS 09/30/2007  . GERD 10/01/2007  . HYPERLIPIDEMIA 12/14/2006  . OOPHORECTOMY, RIGHT, HX OF 12/14/2006  . OSTEOPENIA 02/07/2008  . RECTAL BLEEDING 10/01/2007  . ROTATOR CUFF REPAIR, RIGHT, HX OF 12/14/2006  . VITAMIN D DEFICIENCY 01/25/2009   Past Surgical History:  Procedure Laterality Date  . ABDOMINAL HYSTERECTOMY    . IRRIGATION AND DEBRIDEMENT SEBACEOUS CYST  06/07   [name]  . LE Art Doppler   '98   normal  . Nuc Stress test   07/07   normal study  . PARTIAL MASTECTOMY WITH NEEDLE LOCALIZATION Left 05/11/2013   Procedure: PARTIAL MASTECTOMY WITH NEEDLE LOCALIZATION;  Surgeon: [name], MD;  Location: [place];  Service: General;  Laterality: Left;  . RIGHT OOPHORECTOMY    . ROTATOR CUFF REPAIR     right  . TUBAL LIGATION      reports that she has never smoked. She has never used smokeless tobacco. She reports that she does not drink alcohol or use drugs. family history includes Dementia in her father; Hypertension in her father. Allergies  Allergen Reactions  . Sulfa Antibiotics     Outpatient Medications Prior to Visit  Medication Sig Dispense Refill  . pravastatin (PRAVACHOL) 40 MG tablet Take 1 tablet (40 mg total) by mouth at  bedtime. 90 tablet 1  . tamoxifen (NOLVADEX) 20 MG tablet Take 1 tablet (20 mg total) by mouth daily. 90 tablet 3  . doxepin (SINEQUAN) 25 MG capsule Take 1 capsule (25 mg total) by mouth at bedtime. 90 capsule 3  . ranitidine (ZANTAC) 150 MG tablet Take 150 mg by mouth daily as needed.      No facility-administered medications prior to visit.     ROS Review of Systems  Constitutional: Negative.  Negative for activity change, appetite change, diaphoresis, fatigue and unexpected weight change.  HENT: Positive for ear pain and hearing loss.   Respiratory: Negative.  Negative for cough, chest tightness, shortness of breath and wheezing.   Cardiovascular: Negative.  Negative for chest pain, palpitations and leg swelling.  Gastrointestinal: Negative for abdominal pain, constipation, diarrhea, nausea and vomiting.  Endocrine: Negative.   Genitourinary: Negative.  Negative for difficulty urinating.  Musculoskeletal: Positive for arthralgias. Negative for back pain and myalgias.  Skin: Negative.  Negative for color change, pallor and rash.  Allergic/Immunologic: Negative.   Neurological: Negative.  Negative for dizziness, weakness, light-headedness and headaches.  Hematological: Negative for adenopathy. Does not bruise/bleed easily.  Psychiatric/Behavioral: Negative.     Objective:  BP 112/78 (BP Location: Left Arm, Patient Position: Sitting, Cuff Size: Normal)   Pulse 92   Temp 97.8 F (36.6 C) (Oral)   Resp 16  Ht 5\' 2"  (1.575 m)   Wt 168 lb (76.2 kg)   SpO2 97%   BMI 30.73 kg/m   BP Readings from Last 3 Encounters:  08/11/17 112/78  05/28/17 109/76  07/29/16 120/66    Wt Readings from Last 3 Encounters:  08/11/17 168 lb (76.2 kg)  05/28/17 171 lb 6.4 oz (77.7 kg)  07/29/16 173 lb (78.5 kg)    Physical Exam  Constitutional: She is oriented to person, place, and time. No distress.  HENT:  Right Ear: No tenderness. A foreign body (cerumen impaction) is present. Tympanic  membrane is not injected. Decreased hearing is noted.  Left Ear: Hearing, tympanic membrane, external ear and ear canal normal.  Mouth/Throat: Oropharynx is clear and moist. No oropharyngeal exudate.  I put Colace in the right EAC and then I irrigated it with water and used an ear pick to remove the cerumen.  After this her ear symptoms have resolved and the examination of the EAC is within normal limits.  Eyes: Conjunctivae are normal. No scleral icterus.  Neck: Normal range of motion. Neck supple. No JVD present. No thyromegaly present.  Cardiovascular: Normal rate, regular rhythm and normal heart sounds. Exam reveals no gallop.  No murmur heard. Pulmonary/Chest: Effort normal and breath sounds normal. No respiratory distress. She has no wheezes. She has no rales.  Abdominal: Soft. Bowel sounds are normal. She exhibits no mass. There is no hepatosplenomegaly. There is no tenderness.  Genitourinary:  Genitourinary Comments: Breast/GU/rectal exams were deferred at her request.  Musculoskeletal: Normal range of motion. She exhibits no edema, tenderness or deformity.  Lymphadenopathy:    She has no cervical adenopathy.  Neurological: She is alert and oriented to person, place, and time.  Skin: Skin is warm and dry. She is not diaphoretic.  Psychiatric: She has a normal mood and affect. Her behavior is normal. Judgment and thought content normal.  Vitals reviewed.   Lab Results  Component Value Date   WBC 5.6 08/11/2017   HGB 11.5 (L) 08/11/2017   HCT 35.4 (L) 08/11/2017   PLT 213.0 08/11/2017   GLUCOSE 119 (H) 08/11/2017   CHOL 177 08/11/2017   TRIG 217.0 (H) 08/11/2017   HDL 63.70 08/11/2017   LDLDIRECT 82.0 08/11/2017   LDLCALC 71 07/29/2016   ALT 8 08/11/2017   AST 14 08/11/2017   NA 142 08/11/2017   K 4.1 08/11/2017   CL 107 08/11/2017   CREATININE 0.75 08/11/2017   BUN 13 08/11/2017   CO2 26 08/11/2017   TSH 1.19 08/11/2017   HGBA1C 6.3 08/11/2017    Dg Hip Unilat With  Pelvis 2-3 Views Right  Result Date: 11/01/2015 CLINICAL DATA:  Chronic RIGHT hip pain EXAM: DG HIP (WITH OR WITHOUT PELVIS) 2-3V RIGHT COMPARISON:  Non FINDINGS: Marked osseous demineralization. Hip joint spaces preserved. Question minimal sclerosis at the SI joints bilaterally. No acute fracture, dislocation, or bone destruction. Small pelvic phleboliths. Degenerative changes at lumbosacral junction. IMPRESSION: Osseous demineralization with degenerative disc disease changes at the lumbosacral junction and question sacroiliitis. No acute RIGHT hip abnormalities. Electronically Signed   By: Lavonia Dana M.D.   On: 11/01/2015 14:50    Assessment & Plan:   Rie was seen today for hypertension, hyperlipidemia and annual exam.  Diagnoses and all orders for this visit:  Essential hypertension- Her blood pressure is adequately well controlled.  Electrolytes and renal function are normal.  Antihypertensive therapy is not indicated. -     Comprehensive metabolic panel; Future -  CBC with Differential/Platelet; Future -     TSH; Future  Hyperlipidemia with target LDL less than 130- She has achieved her LDL goal and is doing well on the statin. -     Lipid panel; Future  Prediabetes- Her A1c is at 6.3%.  Medical therapy is not indicated. -     Hemoglobin A1c; Future  Vitamin D deficiency -     VITAMIN D 25 Hydroxy (Vit-D Deficiency, Fractures); Future -     Cholecalciferol 2000 units TABS; Take 2 tablets (4,000 Units total) by mouth daily.   I have discontinued Mirissa L. Bueno's ranitidine and doxepin. I am also having her start on Cholecalciferol. Additionally, I am having her maintain her tamoxifen and pravastatin.  Meds ordered this encounter  Medications  . Cholecalciferol 2000 units TABS    Sig: Take 2 tablets (4,000 Units total) by mouth daily.    Dispense:  180 tablet    Refill:  1     Follow-up: Return in about 1 year (around 08/12/2018).  Scarlette Calico, MD

## 2017-08-11 NOTE — Patient Instructions (Signed)

## 2017-08-12 DIAGNOSIS — H6121 Impacted cerumen, right ear: Secondary | ICD-10-CM | POA: Insufficient documentation

## 2017-08-12 NOTE — Assessment & Plan Note (Signed)

## 2017-08-16 ENCOUNTER — Other Ambulatory Visit: Payer: Self-pay | Admitting: Oncology

## 2017-08-27 ENCOUNTER — Ambulatory Visit: Payer: Self-pay | Admitting: *Deleted

## 2017-08-27 NOTE — Telephone Encounter (Signed)
Pt states drowsy during days after starting Vit. D as ordered by Dr. Ronnald Ramp 08/11/17.  No other symptoms. Pt questioning if she can take at night. Pt informed Vit D best absorbed with food, especially dairy, fat; eggs, cheese, nuts. Discussed appropriate food choices. Pt states she will take with evening meal which is usually very light, and have snack at bedtime. Instructed pt to call back if any other questions arise; if taking at HS interferes with sleep.  Reason for Disposition . Caller has medication question only, adult not sick, and triager answers question  Answer Assessment - Initial Assessment Questions 1. SYMPTOMS: "Do you have any symptoms?"     Drowsy during day after taking Vit. D 2. SEVERITY: If symptoms are present, ask "Are they mild, moderate or severe?"     Varies  Protocols used: MEDICATION QUESTION CALL-A-AH

## 2017-10-06 ENCOUNTER — Other Ambulatory Visit (INDEPENDENT_AMBULATORY_CARE_PROVIDER_SITE_OTHER): Payer: Medicare Other

## 2017-10-06 ENCOUNTER — Ambulatory Visit (INDEPENDENT_AMBULATORY_CARE_PROVIDER_SITE_OTHER): Payer: Medicare Other | Admitting: Internal Medicine

## 2017-10-06 ENCOUNTER — Encounter: Payer: Self-pay | Admitting: Internal Medicine

## 2017-10-06 DIAGNOSIS — M109 Gout, unspecified: Secondary | ICD-10-CM

## 2017-10-06 LAB — CBC WITH DIFFERENTIAL/PLATELET
BASOS ABS: 0 10*3/uL (ref 0.0–0.1)
Basophils Relative: 0.6 % (ref 0.0–3.0)
EOS PCT: 1.1 % (ref 0.0–5.0)
Eosinophils Absolute: 0.1 10*3/uL (ref 0.0–0.7)
HCT: 32.9 % — ABNORMAL LOW (ref 36.0–46.0)
HEMOGLOBIN: 11.1 g/dL — AB (ref 12.0–15.0)
LYMPHS ABS: 1.7 10*3/uL (ref 0.7–4.0)
Lymphocytes Relative: 32.2 % (ref 12.0–46.0)
MCHC: 33.6 g/dL (ref 30.0–36.0)
MCV: 84 fl (ref 78.0–100.0)
MONO ABS: 0.3 10*3/uL (ref 0.1–1.0)
Monocytes Relative: 6.4 % (ref 3.0–12.0)
Neutro Abs: 3.2 10*3/uL (ref 1.4–7.7)
Neutrophils Relative %: 59.7 % (ref 43.0–77.0)
Platelets: 232 10*3/uL (ref 150.0–400.0)
RBC: 3.92 Mil/uL (ref 3.87–5.11)
RDW: 14.1 % (ref 11.5–15.5)
WBC: 5.4 10*3/uL (ref 4.0–10.5)

## 2017-10-06 LAB — COMPREHENSIVE METABOLIC PANEL
ALT: 10 U/L (ref 0–35)
AST: 16 U/L (ref 0–37)
Albumin: 4.1 g/dL (ref 3.5–5.2)
Alkaline Phosphatase: 44 U/L (ref 39–117)
BILIRUBIN TOTAL: 0.3 mg/dL (ref 0.2–1.2)
BUN: 15 mg/dL (ref 6–23)
CALCIUM: 9.8 mg/dL (ref 8.4–10.5)
CO2: 29 meq/L (ref 19–32)
CREATININE: 1.02 mg/dL (ref 0.40–1.20)
Chloride: 106 mEq/L (ref 96–112)
GFR: 66.7 mL/min (ref >60.00)
Glucose, Bld: 110 mg/dL — ABNORMAL HIGH (ref 70–99)
Potassium: 3.8 mEq/L (ref 3.5–5.1)
Sodium: 142 mEq/L (ref 135–145)
Total Protein: 7.4 g/dL (ref 6.0–8.3)

## 2017-10-06 LAB — URIC ACID: URIC ACID, SERUM: 6.1 mg/dL (ref 2.4–7.0)

## 2017-10-06 MED ORDER — INDOMETHACIN 50 MG PO CAPS: 50.0000 mg | ORAL_CAPSULE | Freq: Three times a day (TID) | ORAL | 0 refills | Status: DC

## 2017-10-06 NOTE — Patient Instructions (Signed)
Test(s) ordered today. Your results will be released to [place] (or called to you) after review, usually within 72hours after test completion. If any changes need to be made, you will be notified at that same time.  Medications reviewed and updated.  Changes include taking indomethacin.  Take it with food.   Your prescription(s) have been submitted to your pharmacy. Please take as directed and contact our office if you believe you are having problem(s) with the medication(s).  Call if no improvement      Gout Gout is painful swelling that can occur in some of your joints. Gout is a type of arthritis. This condition is caused by having too much uric acid in your body. Uric acid is a chemical that forms when your body breaks down substances called purines. Purines are important for building body proteins. When your body has too much uric acid, sharp crystals can form and build up inside your joints. This causes pain and swelling. Gout attacks can happen quickly and be very painful (acute gout). Over time, the attacks can affect more joints and become more frequent (chronic gout). Gout can also cause uric acid to build up under your skin and inside your kidneys. What are the causes? This condition is caused by too much uric acid in your blood. This can occur because:  Your kidneys do not remove enough uric acid from your blood. This is the most common cause.  Your body makes too much uric acid. This can occur with some cancers and cancer treatments. It can also occur if your body is breaking down too many red blood cells (hemolytic anemia).  You eat too many foods that are high in purines. These foods include organ meats and some seafood. Alcohol, especially beer, is also high in purines.  A gout attack may be triggered by trauma or stress. What increases the risk? This condition is more likely to develop in people who:  Have a family history of gout.  Are female and middle-aged.  Are  female and have gone through menopause.  Are obese.  Frequently drink alcohol, especially beer.  Are dehydrated.  Lose weight too quickly.  Have an organ transplant.  Have lead poisoning.  Take certain medicines, including aspirin, cyclosporine, diuretics, levodopa, and niacin.  Have kidney disease or psoriasis.  What are the signs or symptoms? An attack of acute gout happens quickly. It usually occurs in just one joint. The most common place is the big toe. Attacks often start at night. Other joints that may be affected include joints of the feet, ankle, knee, fingers, wrist, or elbow. Symptoms may include:  Severe pain.  Warmth.  Swelling.  Stiffness.  Tenderness. The affected joint may be very painful to touch.  Shiny, red, or purple skin.  Chills and fever.  Chronic gout may cause symptoms more frequently. More joints may be involved. You may also have white or yellow lumps (tophi) on your hands or feet or in other areas near your joints. How is this diagnosed? This condition is diagnosed based on your symptoms, medical history, and physical exam. You may have tests, such as:  Blood tests to measure uric acid levels.  Removal of joint fluid with a needle (aspiration) to look for uric acid crystals.  X-rays to look for joint damage.  How is this treated? Treatment for this condition has two phases: treating an acute attack and preventing future attacks. Acute gout treatment may include medicines to reduce pain and swelling, including:  NSAIDs.  Steroids. These are strong anti-inflammatory medicines that can be taken by mouth (orally) or injected into a joint.  Colchicine. This medicine relieves pain and swelling when it is taken soon after an attack. It can be given orally or through an IV tube.  Preventive treatment may include:  Daily use of smaller doses of NSAIDs or colchicine.  Use of a medicine that reduces uric acid levels in your blood.  Changes  to your diet. You may need to see a specialist about healthy eating (dietitian).  Follow these instructions at home: During a Gout Attack  If directed, apply ice to the affected area: ? Put ice in a plastic bag. ? Place a towel between your skin and the bag. ? Leave the ice on for 20 minutes, 2-3 times a day.  Rest the joint as much as possible. If the affected joint is in your leg, you may be given crutches to use.  Raise (elevate) the affected joint above the level of your heart as often as possible.  Drink enough fluids to keep your urine clear or pale yellow.  Take over-the-counter and prescription medicines only as told by your health care provider.  Do not drive or operate heavy machinery while taking prescription pain medicine.  Follow instructions from your health care provider about eating or drinking restrictions.  Return to your normal activities as told by your health care provider. Ask your health care provider what activities are safe for you. Avoiding Future Gout Attacks  Follow a low-purine diet as told by your dietitian or health care provider. Avoid foods and drinks that are high in purines, including liver, kidney, anchovies, asparagus, herring, mushrooms, mussels, and beer.  Limit alcohol intake to no more than 1 drink a day for nonpregnant women and 2 drinks a day for men. One drink equals 12 oz of beer, 5 oz of wine, or 1 oz of hard liquor.  Maintain a healthy weight or lose weight if you are overweight. If you want to lose weight, talk with your health care provider. It is important that you do not lose weight too quickly.  Start or maintain an exercise program as told by your health care provider.  Drink enough fluids to keep your urine clear or pale yellow.  Take over-the-counter and prescription medicines only as told by your health care provider.  Keep all follow-up visits as told by your health care provider. This is important. Contact a health care  provider if:  You have another gout attack.  You continue to have symptoms of a gout attack after10 days of treatment.  You have side effects from your medicines.  You have chills or a fever.  You have burning pain when you urinate.  You have pain in your lower back or belly. Get help right away if:  You have severe or uncontrolled pain.  You cannot urinate. This information is not intended to replace advice given to you by your health care provider. Make sure you discuss any questions you have with your health care provider. Document Released: 03/07/2000 Document Revised: 08/16/2015 Document Reviewed: 12/21/2014 Elsevier Interactive Patient Education  [employer].

## 2017-10-06 NOTE — Assessment & Plan Note (Signed)
Symptoms consistent with gout-this would be her first episode We will treat with indomethacin since her kidney function is normal and she does take Advil and does well with it Advised her to take with food and not to take any other NSAIDs while taking this Stop if she experiences stomach upset Take for 1 day after symptoms stop Check CBC, CMP and uric acid level Discussed that she may have another episode and may require further evaluation at that time Call if no improvement or with any questions

## 2017-10-06 NOTE — Progress Notes (Signed)
Subjective:    Patient ID: [name], female    DOB: Feb 17, 1934, 84 y.o.   MRN: [id number]  HPI The patient is here for an acute visit.   Right foot/ankle swelling for one week:  She woke up with it last Wednesday.  The medial ankle was swollen and sore.  The next day she could hardly put any weight on it. She denies any injuries.  It is warm to touch.  She has taken advil and soaking it in epsom salts.  It is slightly better, but still swollen and sore.  She denies any prior episodes that are similar.  She denies any fevers or chills.  There is no numbness or tingling that is new, but she has had some mild numbness in her feet for a while.  She is blind and unable to tell if there is any redness, but no one told her that the area was red.    Medications and allergies reviewed with patient and updated if appropriate.  Patient Active Problem List   Diagnosis Date Noted  . Hearing loss due to cerumen impaction, right 08/12/2017  . Essential hypertension 07/10/2015  . Prediabetes 07/06/2013  . Murmur, cardiac 07/06/2013  . Malignant neoplasm of lower-inner quadrant of left breast in female, estrogen receptor positive ([place]) 03/31/2013  . Routine health maintenance 04/12/2011  . Vitamin D deficiency 01/25/2009  . Disorder of bone and cartilage 02/07/2008  . GERD 10/01/2007  . Hyperlipidemia with target LDL less than 130 12/14/2006  . BLINDNESS, LEGAL, [place] DEFINITION 12/14/2006    Current Outpatient Medications on File Prior to Visit  Medication Sig Dispense Refill  . Cholecalciferol 2000 units TABS Take 2 tablets (4,000 Units total) by mouth daily. 180 tablet 1  . pravastatin (PRAVACHOL) 40 MG tablet Take 1 tablet (40 mg total) by mouth at bedtime. 90 tablet 1  . tamoxifen (NOLVADEX) 20 MG tablet TAKE 1 TABLET BY MOUTH EVERY DAY 90 tablet 0   No current facility-administered medications on file prior to visit.     Past Medical History:  Diagnosis Date  . Anemia   .  Arthritis   . Blind   . BLINDNESS, [place], [place] DEFINITION 12/14/2006  . Cancer (Ages)    breast  . CONSTIPATION 12/14/2006  . Edema   . EXTERNAL HEMORRHOIDS 09/30/2007  . GERD 10/01/2007  . HYPERLIPIDEMIA 12/14/2006  . OOPHORECTOMY, RIGHT, HX OF 12/14/2006  . OSTEOPENIA 02/07/2008  . RECTAL BLEEDING 10/01/2007  . ROTATOR CUFF REPAIR, RIGHT, HX OF 12/14/2006  . VITAMIN D DEFICIENCY 01/25/2009    Past Surgical History:  Procedure Laterality Date  . ABDOMINAL HYSTERECTOMY    . IRRIGATION AND DEBRIDEMENT SEBACEOUS CYST  06/07   [name]  . LE Art Doppler   '98   normal  . Nuc Stress test   07/07   normal study  . PARTIAL MASTECTOMY WITH NEEDLE LOCALIZATION Left 05/11/2013   Procedure: PARTIAL MASTECTOMY WITH NEEDLE LOCALIZATION;  Surgeon: [name], MD;  Location: [facility];  Service: General;  Laterality: Left;  . RIGHT OOPHORECTOMY    . ROTATOR CUFF REPAIR     right  . TUBAL LIGATION      Social History   Socioeconomic History  . Marital status: Married    Spouse name: Not on file  . Number of children: 5  . Years of education: 37  . Highest education level: Not on file  Occupational History  . Not on file  Social Needs  .  Financial resource strain: Not on file  . Food insecurity:    Worry: Not on file    Inability: Not on file  . Transportation needs:    Medical: Not on file    Non-medical: Not on file  Tobacco Use  . Smoking status: Never Smoker  . Smokeless tobacco: Never Used  Substance and Sexual Activity  . Alcohol use: No  . Drug use: No  . Sexual activity: Not on file  Lifestyle  . Physical activity:    Days per week: Not on file    Minutes per session: Not on file  . Stress: Not on file  Relationships  . Social connections:    Talks on phone: Not on file    Gets together: Not on file    Attends religious service: Not on file    Active member of club or organization: Not on file    Attends meetings of clubs or organizations: Not on  file    Relationship status: Not on file  Other Topics Concern  . Not on file  Social History Narrative   HSG   Married '61   3 sons- '62, '64, '65; 2 daughters- '60, '66: 5 grandchildren   Lives with husband   Support family-close at hand          Family History  Problem Relation Age of Onset  . Hypertension Father   . Dementia Father   . Cancer Neg Hx        Negative for breast or colon cancer  . Heart disease Neg Hx     Review of Systems  Constitutional: Negative for chills and fever.  Skin: Negative for color change and wound.  Neurological: Positive for numbness (numbness in feet - mild and chronic, no change recently).       Objective:   Vitals:   10/06/17 1331  BP: 118/70  Pulse: (!) 101  Resp: 16  Temp: 98.2 F (36.8 C)  SpO2: 97%   BP Readings from Last 3 Encounters:  10/06/17 118/70  08/11/17 112/78  05/28/17 109/76   Wt Readings from Last 3 Encounters:  10/06/17 173 lb (78.5 kg)  08/11/17 168 lb (76.2 kg)  05/28/17 171 lb 6.4 oz (77.7 kg)   Body mass index is 31.64 kg/m.   Physical Exam  Constitutional: She appears well-developed and well-nourished. No distress.  HENT:  Head: Normocephalic and atraumatic.  Musculoskeletal:  Right ankle swollen with mild swelling in the right foot and lower leg, tenderness with palpation throughout ankle, foot and lower leg.  No obvious erythema.  No ulcers, wounds or lacerations.  Ankle is warm to touch.  Skin: Skin is warm and dry. No rash noted. She is not diaphoretic. No erythema.           Assessment & Plan:    See Problem List for Assessment and Plan of chronic medical problems.

## 2017-10-16 ENCOUNTER — Encounter (HOSPITAL_COMMUNITY): Payer: Self-pay | Admitting: Emergency Medicine

## 2017-10-16 ENCOUNTER — Emergency Department (HOSPITAL_COMMUNITY): Payer: Medicare Other

## 2017-10-16 ENCOUNTER — Emergency Department (HOSPITAL_BASED_OUTPATIENT_CLINIC_OR_DEPARTMENT_OTHER): Payer: Medicare Other

## 2017-10-16 ENCOUNTER — Emergency Department (HOSPITAL_COMMUNITY)
Admission: EM | Admit: 2017-10-16 | Discharge: 2017-10-16 | Disposition: A | Discharge status: Home / Self Care | Payer: Medicare Other | Attending: Emergency Medicine | Admitting: Emergency Medicine

## 2017-10-16 DIAGNOSIS — H548 Legal blindness, as defined in USA: Secondary | ICD-10-CM | POA: Diagnosis not present

## 2017-10-16 DIAGNOSIS — Z79899 Other long term (current) drug therapy: Secondary | ICD-10-CM | POA: Insufficient documentation

## 2017-10-16 DIAGNOSIS — R609 Edema, unspecified: Secondary | ICD-10-CM | POA: Diagnosis not present

## 2017-10-16 DIAGNOSIS — M7989 Other specified soft tissue disorders: Secondary | ICD-10-CM

## 2017-10-16 DIAGNOSIS — M79609 Pain in unspecified limb: Secondary | ICD-10-CM | POA: Diagnosis not present

## 2017-10-16 DIAGNOSIS — M25571 Pain in right ankle and joints of right foot: Secondary | ICD-10-CM | POA: Diagnosis not present

## 2017-10-16 DIAGNOSIS — I10 Essential (primary) hypertension: Secondary | ICD-10-CM | POA: Insufficient documentation

## 2017-10-16 DIAGNOSIS — R2243 Localized swelling, mass and lump, lower limb, bilateral: Secondary | ICD-10-CM | POA: Diagnosis present

## 2017-10-16 DIAGNOSIS — R600 Localized edema: Secondary | ICD-10-CM | POA: Diagnosis not present

## 2017-10-16 LAB — CBC WITH DIFFERENTIAL/PLATELET
BASOS ABS: 0 10*3/uL (ref 0.0–0.1)
BASOS PCT: 0 %
Eosinophils Absolute: 0.1 10*3/uL (ref 0.0–0.7)
Eosinophils Relative: 1 %
HCT: 32.9 % — ABNORMAL LOW (ref 36.0–46.0)
Hemoglobin: 10.7 g/dL — ABNORMAL LOW (ref 12.0–15.0)
LYMPHS PCT: 25 %
Lymphs Abs: 1.3 10*3/uL (ref 0.7–4.0)
MCH: 27.9 pg (ref 26.0–34.0)
MCHC: 32.5 g/dL (ref 30.0–36.0)
MCV: 85.9 fL (ref 78.0–100.0)
MONO ABS: 0.4 10*3/uL (ref 0.1–1.0)
Monocytes Relative: 7 %
NEUTROS ABS: 3.6 10*3/uL (ref 1.7–7.7)
Neutrophils Relative %: 67 %
PLATELETS: 242 10*3/uL (ref 150–400)
RBC: 3.83 MIL/uL — AB (ref 3.87–5.11)
RDW: 14.6 % (ref 11.5–15.5)
WBC: 5.5 10*3/uL (ref 4.0–10.5)

## 2017-10-16 LAB — COMPREHENSIVE METABOLIC PANEL
ALBUMIN: 3.7 g/dL (ref 3.5–5.0)
ALT: 13 U/L (ref 0–44)
AST: 21 U/L (ref 15–41)
Alkaline Phosphatase: 50 U/L (ref 38–126)
Anion gap: 7 (ref 5–15)
BUN: 15 mg/dL (ref 8–23)
CO2: 28 mmol/L (ref 22–32)
CREATININE: 0.91 mg/dL (ref 0.44–1.00)
Calcium: 9.5 mg/dL (ref 8.9–10.3)
Chloride: 111 mmol/L (ref 98–111)
GFR calc Af Amer: >60 mL/min (ref >60)
GFR, EST NON AFRICAN AMERICAN: 57 mL/min — AB (ref >60)
GLUCOSE: 101 mg/dL — AB (ref 70–99)
POTASSIUM: 3.7 mmol/L (ref 3.5–5.1)
Sodium: 146 mmol/L — ABNORMAL HIGH (ref 135–145)
Total Bilirubin: 0.5 mg/dL (ref 0.3–1.2)
Total Protein: 7.1 g/dL (ref 6.5–8.1)

## 2017-10-16 MED ORDER — DOXYCYCLINE HYCLATE 100 MG PO CAPS: 100.0000 mg | ORAL_CAPSULE | Freq: Two times a day (BID) | ORAL | 0 refills | Status: DC

## 2017-10-16 NOTE — ED Provider Notes (Signed)
[facility] DEPT Provider Note   CSN: [id number] Arrival date & time: 10/16/17  0913     History   Chief Complaint Chief Complaint  Patient presents with  . Leg Swelling    HPI [name] is a 84 y.o. female.  The history is provided by the patient. No language interpreter was used.   [name] is a 84 y.o. female who presents to the Emergency Department complaining of edema. She presents to the emergency department complaining of bilateral lower extremity edema that began about three weeks ago. Her symptoms initially began in her right foot and ankle with redness and pain. She saw her PCP and was started on indomethacin for presumed gout. She states that the pain has resolved with standing and walking but she has experienced progressive lower extremity edema. She denies any fevers, chest pain, shortness of breath, nausea, vomiting, abdominal pain, change and urination. She has a history of hyperlipidemia, breast cancer on tamoxifen. No history of kidney disease, heart disease, lung disease, DVT/PE. Past Medical History:  Diagnosis Date  . Anemia   . Arthritis   . Blind   . BLINDNESS, [place], [place] DEFINITION 12/14/2006  . Cancer ([place])    breast  . CONSTIPATION 12/14/2006  . Edema   . EXTERNAL HEMORRHOIDS 09/30/2007  . GERD 10/01/2007  . HYPERLIPIDEMIA 12/14/2006  . OOPHORECTOMY, RIGHT, HX OF 12/14/2006  . OSTEOPENIA 02/07/2008  . RECTAL BLEEDING 10/01/2007  . ROTATOR CUFF REPAIR, RIGHT, HX OF 12/14/2006  . VITAMIN D DEFICIENCY 01/25/2009    Patient Active Problem List   Diagnosis Date Noted  . Gout attack 10/06/2017  . Hearing loss due to cerumen impaction, right 08/12/2017  . Essential hypertension 07/10/2015  . Prediabetes 07/06/2013  . Murmur, cardiac 07/06/2013  . Malignant neoplasm of lower-inner quadrant of left breast in female, estrogen receptor positive ([place]) 03/31/2013  . Routine health maintenance 04/12/2011  . Vitamin D  deficiency 01/25/2009  . Disorder of bone and cartilage 02/07/2008  . GERD 10/01/2007  . Hyperlipidemia with target LDL less than 130 12/14/2006  . BLINDNESS, LEGAL, [place] DEFINITION 12/14/2006    Past Surgical History:  Procedure Laterality Date  . ABDOMINAL HYSTERECTOMY    . IRRIGATION AND DEBRIDEMENT SEBACEOUS CYST  06/07   [name]  . LE Art Doppler   '98   normal  . Nuc Stress test   07/07   normal study  . PARTIAL MASTECTOMY WITH NEEDLE LOCALIZATION Left 05/11/2013   Procedure: PARTIAL MASTECTOMY WITH NEEDLE LOCALIZATION;  Surgeon: [name], MD;  Location: [facility];  Service: General;  Laterality: Left;  . RIGHT OOPHORECTOMY    . ROTATOR CUFF REPAIR     right  . TUBAL LIGATION       OB History   None      Home Medications    Prior to Admission medications   Medication Sig Start Date End Date Taking? Authorizing Provider  Cholecalciferol 2000 units TABS Take 2 tablets (4,000 Units total) by mouth daily. 08/11/17  Yes Janith Lima, MD  indomethacin (INDOCIN) 50 MG capsule Take 1 capsule (50 mg total) by mouth 3 (three) times daily with meals. 10/06/17  Yes Burns, Claudina Lick, MD  pravastatin (PRAVACHOL) 40 MG tablet Take 1 tablet (40 mg total) by mouth at bedtime. 06/17/17  Yes Janith Lima, MD  tamoxifen (NOLVADEX) 20 MG tablet TAKE 1 TABLET BY MOUTH EVERY DAY 08/18/17  Yes Magrinat, Virgie Dad, MD  doxycycline (  VIBRAMYCIN) 100 MG capsule Take 1 capsule (100 mg total) by mouth 2 (two) times daily. 10/16/17   Quintella Reichert, MD    Family History Family History  Problem Relation Age of Onset  . Hypertension Father   . Dementia Father   . Cancer Neg Hx        Negative for breast or colon cancer  . Heart disease Neg Hx     Social History Social History   Tobacco Use  . Smoking status: Never Smoker  . Smokeless tobacco: Never Used  Substance Use Topics  . Alcohol use: No  . Drug use: No     Allergies   Sulfa antibiotics   Review of  Systems Review of Systems  All other systems reviewed and are negative.    Physical Exam Updated Vital Signs BP (!) 155/82 (BP Location: Right Arm)   Pulse 82   Temp 98.5 F (36.9 C) (Oral)   Resp 18   SpO2 100%   Physical Exam  Constitutional: She is oriented to person, place, and time. She appears well-developed and well-nourished.  HENT:  Head: Normocephalic and atraumatic.  Cardiovascular: Normal rate and regular rhythm.  No murmur heard. Pulmonary/Chest: Effort normal and breath sounds normal. No respiratory distress.  Abdominal: Soft. There is no tenderness. There is no rebound and no guarding.  Musculoskeletal: She exhibits no tenderness.  2+ DP pulses bilaterally. 2 to 3+ pitting edema to bilateral lower extremities from the foot to the knee. There is mild erythema and tenderness to palpation over the right to mid foot and ankle.  Neurological: She is alert and oriented to person, place, and time.  Skin: Skin is warm and dry.  Psychiatric: She has a normal mood and affect. Her behavior is normal.  Nursing note and vitals reviewed.    ED Treatments / Results  Labs (all labs ordered are listed, but only abnormal results are displayed) Labs Reviewed  CBC WITH DIFFERENTIAL/PLATELET - Abnormal; Notable for the following components:      Result Value   RBC 3.83 (*)    Hemoglobin 10.7 (*)    HCT 32.9 (*)    All other components within normal limits  COMPREHENSIVE METABOLIC PANEL - Abnormal; Notable for the following components:   Sodium 146 (*)    Glucose, Bld 101 (*)    GFR calc non Af Amer 57 (*)    All other components within normal limits  CBC WITH DIFFERENTIAL/PLATELET    EKG None  Radiology Dg Ankle Complete Right  Result Date: 10/16/2017 CLINICAL DATA:  Pt potentially has a blood clot in her right lower leg. Pt having pain in medial and anterior right ankle with right lower leg swelling. Doppler in room following x-ray. EXAM: RIGHT ANKLE - COMPLETE 3+  VIEW COMPARISON:  None. FINDINGS: No fracture.  No bone lesion. Ankle joint normally spaced and aligned.  No arthropathic changes. There is diffuse soft tissue swelling. IMPRESSION: 1. No skeletal abnormality.  No ankle joint abnormality. 2. Diffuse soft tissue swelling. Electronically Signed   By: Lajean Manes M.D.   On: 10/16/2017 10:32    Procedures Procedures (including critical care time)  Medications Ordered in ED Medications - No data to display   Initial Impression / Assessment and Plan / ED Course  I have reviewed the triage vital signs and the nursing notes.  Pertinent labs & imaging results that were available during my care of the patient were reviewed by me and considered in my medical decision  making (see chart for details).     Patient here for evaluation of edema to bilateral lower extremities with erythema to the right foot. She has no respiratory complaints, no shortness of breath and clear lungs. Presentation is not consistent with acute CHF. DVT study is negative. No concerning features for septic arthritis. She is currently undergoing treatment for gout, recommend continuing treatment and will treat empirically for possible cellulitis as well. Discussed with patient home care, outpatient follow-up and return precautions.  Final Clinical Impressions(s) / ED Diagnoses   Final diagnoses:  Peripheral edema    ED Discharge Orders        Ordered    doxycycline (VIBRAMYCIN) 100 MG capsule  2 times daily     10/16/17 1318       Quintella Reichert, MD 10/16/17 1725

## 2017-10-16 NOTE — Progress Notes (Signed)
Right lower extremity venous duplex completed. Preliminary results. There is no evidence of a DVT or Baker's cyst. Toma Copier, RVS 10/16/2017, 10:58 AM

## 2017-10-16 NOTE — ED Triage Notes (Signed)
Patient here from home with complaints of bilateral leg and feet swelling that started 3 weeks ago. Seen by primary and given gout meds with no relief.

## 2017-10-16 NOTE — ED Notes (Signed)
Ultrasound at patient bedside. 

## 2017-10-27 ENCOUNTER — Ambulatory Visit (INDEPENDENT_AMBULATORY_CARE_PROVIDER_SITE_OTHER): Payer: Medicare Other | Admitting: Internal Medicine

## 2017-10-27 ENCOUNTER — Encounter: Payer: Self-pay | Admitting: Internal Medicine

## 2017-10-27 ENCOUNTER — Other Ambulatory Visit (INDEPENDENT_AMBULATORY_CARE_PROVIDER_SITE_OTHER): Payer: Medicare Other

## 2017-10-27 VITALS — BP 144/74 | HR 76 | Temp 98.2°F | Ht 62.0 in | Wt 174.8 lb

## 2017-10-27 DIAGNOSIS — R600 Localized edema: Secondary | ICD-10-CM | POA: Diagnosis not present

## 2017-10-27 DIAGNOSIS — E559 Vitamin D deficiency, unspecified: Secondary | ICD-10-CM

## 2017-10-27 DIAGNOSIS — I10 Essential (primary) hypertension: Secondary | ICD-10-CM

## 2017-10-27 DIAGNOSIS — D510 Vitamin B12 deficiency anemia due to intrinsic factor deficiency: Secondary | ICD-10-CM

## 2017-10-27 DIAGNOSIS — R011 Cardiac murmur, unspecified: Secondary | ICD-10-CM | POA: Diagnosis not present

## 2017-10-27 DIAGNOSIS — I5032 Chronic diastolic (congestive) heart failure: Secondary | ICD-10-CM | POA: Insufficient documentation

## 2017-10-27 DIAGNOSIS — D539 Nutritional anemia, unspecified: Secondary | ICD-10-CM | POA: Insufficient documentation

## 2017-10-27 LAB — CBC WITH DIFFERENTIAL/PLATELET
Basophils Absolute: 0 10*3/uL (ref 0.0–0.1)
Basophils Relative: 0.5 % (ref 0.0–3.0)
EOS PCT: 1 % (ref 0.0–5.0)
Eosinophils Absolute: 0.1 10*3/uL (ref 0.0–0.7)
HCT: 31.7 % — ABNORMAL LOW (ref 36.0–46.0)
Hemoglobin: 10.6 g/dL — ABNORMAL LOW (ref 12.0–15.0)
LYMPHS PCT: 31.6 % (ref 12.0–46.0)
Lymphs Abs: 2 10*3/uL (ref 0.7–4.0)
MCHC: 33.4 g/dL (ref 30.0–36.0)
MCV: 84.2 fl (ref 78.0–100.0)
MONOS PCT: 8.5 % (ref 3.0–12.0)
Monocytes Absolute: 0.5 10*3/uL (ref 0.1–1.0)
NEUTROS ABS: 3.6 10*3/uL (ref 1.4–7.7)
NEUTROS PCT: 58.4 % (ref 43.0–77.0)
Platelets: 259 10*3/uL (ref 150.0–400.0)
RBC: 3.76 Mil/uL — AB (ref 3.87–5.11)
RDW: 14.2 % (ref 11.5–15.5)
WBC: 6.2 10*3/uL (ref 4.0–10.5)

## 2017-10-27 LAB — VITAMIN B12: Vitamin B-12: 196 pg/mL — ABNORMAL LOW (ref 211–911)

## 2017-10-27 LAB — IBC PANEL
IRON: 61 ug/dL (ref 42–145)
Saturation Ratios: 20.4 % (ref 20.0–50.0)
Transferrin: 214 mg/dL (ref 212.0–360.0)

## 2017-10-27 LAB — BRAIN NATRIURETIC PEPTIDE: PRO B NATRI PEPTIDE: 36 pg/mL (ref 0.0–100.0)

## 2017-10-27 LAB — FOLATE: Folate: 10.5 ng/mL (ref >5.9)

## 2017-10-27 LAB — VITAMIN D 25 HYDROXY (VIT D DEFICIENCY, FRACTURES): VITD: 28.3 ng/mL — ABNORMAL LOW (ref 30.00–100.00)

## 2017-10-27 LAB — FERRITIN: Ferritin: 155.1 ng/mL (ref 10.0–291.0)

## 2017-10-27 MED ORDER — TORSEMIDE 10 MG PO TABS: 10.0000 mg | ORAL_TABLET | Freq: Every day | ORAL | 1 refills | Status: DC

## 2017-10-27 NOTE — Patient Instructions (Signed)
Edema Edema is an abnormal buildup of fluids in your bodytissues. Edema is somewhatdependent on gravity to pull the fluid to the lowest place in your body. That makes the condition more common in the legs and thighs (lower extremities). Painless swelling of the feet and ankles is common and becomes more likely as you get older. It is also common in looser tissues, like around your eyes. When the affected area is squeezed, the fluid may move out of that spot and leave a dent for a few moments. This dent is called pitting. What are the causes? There are many possible causes of edema. Eating too much salt and being on your feet or sitting for a long time can cause edema in your legs and ankles. Hot weather may make edema worse. Common medical causes of edema include:  Heart failure.  Liver disease.  Kidney disease.  Weak blood vessels in your legs.  Cancer.  An injury.  Pregnancy.  Some medications.  Obesity.  What are the signs or symptoms? Edema is usually painless.Your skin may look swollen or shiny. How is this diagnosed? Your health care provider may be able to diagnose edema by asking about your medical history and doing a physical exam. You may need to have tests such as X-rays, an electrocardiogram, or blood tests to check for medical conditions that may cause edema. How is this treated? Edema treatment depends on the cause. If you have heart, liver, or kidney disease, you need the treatment appropriate for these conditions. General treatment may include:  Elevation of the affected body part above the level of your heart.  Compression of the affected body part. Pressure from elastic bandages or support stockings squeezes the tissues and forces fluid back into the blood vessels. This keeps fluid from entering the tissues.  Restriction of fluid and salt intake.  Use of a water pill (diuretic). These medications are appropriate only for some types of edema. They pull fluid  out of your body and make you urinate more often. This gets rid of fluid and reduces swelling, but diuretics can have side effects. Only use diuretics as directed by your health care provider.  Follow these instructions at home:  Keep the affected body part above the level of your heart when you are lying down.  Do not sit still or stand for prolonged periods.  Do not put anything directly under your knees when lying down.  Do not wear constricting clothing or garters on your upper legs.  Exercise your legs to work the fluid back into your blood vessels. This may help the swelling go down.  Wear elastic bandages or support stockings to reduce ankle swelling as directed by your health care provider.  Eat a low-salt diet to reduce fluid if your health care provider recommends it.  Only take medicines as directed by your health care provider. Contact a health care provider if:  Your edema is not responding to treatment.  You have heart, liver, or kidney disease and notice symptoms of edema.  You have edema in your legs that does not improve after elevating them.  You have sudden and unexplained weight gain. Get help right away if:  You develop shortness of breath or chest pain.  You cannot breathe when you lie down.  You develop pain, redness, or warmth in the swollen areas.  You have heart, liver, or kidney disease and suddenly get edema.  You have a fever and your symptoms suddenly get worse. This information is   not intended to replace advice given to you by your health care provider. Make sure you discuss any questions you have with your health care provider. Document Released: 03/10/2005 Document Revised: 08/16/2015 Document Reviewed: 12/31/2012 Elsevier Interactive Patient Education  2017 [employer]  

## 2017-10-27 NOTE — Progress Notes (Signed)
Subjective:  Patient ID: [name], female    DOB: 01-21-1934  Age: 84 y.o. MRN: [id number]  CC: Hypertension   HPI DASHONNA CHAGNON presents for f/up - she was recently seen in the ED for lower extremity edema.  A medication was not prescribed to treat this.  She denies CP, DOE, palpitations, or fatigue.  She tells me she is not very compliant with her vitamin D supplement.  She has had no pain lately and has stopped taking the NSAID.  She has a history of murmur and underwent an echocardiogram 3 years ago that showed grade 1 diastolic dysfunction.  Outpatient Medications Prior to Visit  Medication Sig Dispense Refill  . Cholecalciferol 2000 units TABS Take 2 tablets (4,000 Units total) by mouth daily. 180 tablet 1  . pravastatin (PRAVACHOL) 40 MG tablet Take 1 tablet (40 mg total) by mouth at bedtime. 90 tablet 1  . tamoxifen (NOLVADEX) 20 MG tablet TAKE 1 TABLET BY MOUTH EVERY DAY 90 tablet 0  . indomethacin (INDOCIN) 50 MG capsule Take 1 capsule (50 mg total) by mouth 3 (three) times daily with meals. 30 capsule 0  . doxycycline (VIBRAMYCIN) 100 MG capsule Take 1 capsule (100 mg total) by mouth 2 (two) times daily. 14 capsule 0   No facility-administered medications prior to visit.     ROS Review of Systems  Constitutional: Negative for diaphoresis and fatigue.  HENT: Negative.   Eyes: Negative for visual disturbance.  Respiratory: Negative for cough, chest tightness, shortness of breath and wheezing.   Cardiovascular: Positive for leg swelling. Negative for chest pain and palpitations.  Gastrointestinal: Negative for abdominal pain, constipation, diarrhea and nausea.  Endocrine: Negative.   Genitourinary: Negative.  Negative for difficulty urinating and dysuria.  Musculoskeletal: Negative.  Negative for arthralgias, myalgias and neck pain.  Skin: Negative.  Negative for color change.  Neurological: Negative.  Negative for dizziness, weakness, light-headedness and  numbness.  Hematological: Negative for adenopathy. Does not bruise/bleed easily.  Psychiatric/Behavioral: Negative.     Objective:  BP (!) 144/74 (BP Location: Right Arm, Patient Position: Sitting, Cuff Size: Large)   Pulse 76   Temp 98.2 F (36.8 C) (Oral)   Ht 5\' 2"  (1.575 m)   Wt 174 lb 12 oz (79.3 kg)   SpO2 98%   BMI 31.96 kg/m   BP Readings from Last 3 Encounters:  10/27/17 (!) 144/74  10/16/17 (!) 155/82  10/06/17 118/70    Wt Readings from Last 3 Encounters:  10/27/17 174 lb 12 oz (79.3 kg)  10/06/17 173 lb (78.5 kg)  08/11/17 168 lb (76.2 kg)    Physical Exam  Constitutional: She is oriented to person, place, and time. No distress.  HENT:  Mouth/Throat: Oropharynx is clear and moist. No oropharyngeal exudate.  Eyes: Conjunctivae are normal. No scleral icterus.  Neck: Normal range of motion. Neck supple. No JVD present. No thyromegaly present.  Cardiovascular: Normal rate, regular rhythm and intact distal pulses. Exam reveals no gallop.  Murmur heard.  Systolic murmur is present with a grade of 1/6.  No diastolic murmur is present. 1/6 SEM RUSB  Pulmonary/Chest: Effort normal and breath sounds normal. No respiratory distress. She has no wheezes. She has no rales.  Abdominal: Soft. Normal appearance and bowel sounds are normal. She exhibits no mass. There is no hepatosplenomegaly. There is no tenderness.  Musculoskeletal: Normal range of motion. She exhibits edema. She exhibits no tenderness or deformity.  Trace BLE ankle edema  Lymphadenopathy:  She has no cervical adenopathy.  Neurological: She is alert and oriented to person, place, and time.  Skin: Skin is warm and dry. She is not diaphoretic. No pallor.  Vitals reviewed.   Lab Results  Component Value Date   WBC 6.2 10/27/2017   HGB 10.6 (L) 10/27/2017   HCT 31.7 (L) 10/27/2017   PLT 259.0 10/27/2017   GLUCOSE 101 (H) 10/16/2017   CHOL 177 08/11/2017   TRIG 217.0 (H) 08/11/2017   HDL 63.70  08/11/2017   LDLDIRECT 82.0 08/11/2017   LDLCALC 71 07/29/2016   ALT 13 10/16/2017   AST 21 10/16/2017   NA 146 (H) 10/16/2017   K 3.7 10/16/2017   CL 111 10/16/2017   CREATININE 0.91 10/16/2017   BUN 15 10/16/2017   CO2 28 10/16/2017   TSH 1.19 08/11/2017   HGBA1C 6.3 08/11/2017    Dg Ankle Complete Right  Result Date: 10/16/2017 CLINICAL DATA:  Pt potentially has a blood clot in her right lower leg. Pt having pain in medial and anterior right ankle with right lower leg swelling. Doppler in room following x-ray. EXAM: RIGHT ANKLE - COMPLETE 3+ VIEW COMPARISON:  None. FINDINGS: No fracture.  No bone lesion. Ankle joint normally spaced and aligned.  No arthropathic changes. There is diffuse soft tissue swelling. IMPRESSION: 1. No skeletal abnormality.  No ankle joint abnormality. 2. Diffuse soft tissue swelling. Electronically Signed   By: Lajean Manes M.D.   On: 10/16/2017 10:32    Assessment & Plan:   Xylina was seen today for hypertension.  Diagnoses and all orders for this visit:  Essential hypertension -     torsemide (DEMADEX) 10 MG tablet; Take 1 tablet (10 mg total) by mouth daily.  Localized edema- She will elevate her lower extremities throughout the day and will decrease her sodium intake.  I have also asked her to start taking a loop diuretic. -     torsemide (DEMADEX) 10 MG tablet; Take 1 tablet (10 mg total) by mouth daily.  Vitamin D deficiency- Her vitamin D level is better but is still low.  She agrees to be more compliant with the vitamin D supplement. -     VITAMIN D 25 Hydroxy (Vit-D Deficiency, Fractures); Future  Murmur, cardiac - she does not have valvular disease.  Chronic heart failure with preserved ejection fraction ([place])- Her BNP is low which is reassuring that she does not have a significant amount of fluid overload. -     Brain natriuretic peptide; Future  Deficiency anemia- She remains mildly anemic and has developed a low B12 level.  She will  start monthly B12 supplements.  I will also screen her for other vitamin deficiencies. -     CBC with Differential/Platelet; Future -     Vitamin B12; Future -     IBC panel; Future -     Folate; Future -     Ferritin; Future -     Vitamin B1; Future  Vitamin B12 deficiency anemia due to intrinsic factor deficiency   I have discontinued Harolyn L. Kampa's indomethacin and doxycycline. I am also having her start on torsemide. Additionally, I am having her maintain her pravastatin, Cholecalciferol, and tamoxifen.  Meds ordered this encounter  Medications  . torsemide (DEMADEX) 10 MG tablet    Sig: Take 1 tablet (10 mg total) by mouth daily.    Dispense:  90 tablet    Refill:  1     Follow-up: Return in about 3 months (  around 01/27/2018).  Scarlette Calico, MD

## 2017-10-30 LAB — VITAMIN B1: VITAMIN B1 (THIAMINE): 12 nmol/L (ref 8–30)

## 2017-11-10 ENCOUNTER — Other Ambulatory Visit: Payer: Self-pay | Admitting: Oncology

## 2017-12-08 DIAGNOSIS — M109 Gout, unspecified: Secondary | ICD-10-CM | POA: Diagnosis not present

## 2017-12-08 DIAGNOSIS — E782 Mixed hyperlipidemia: Secondary | ICD-10-CM | POA: Diagnosis not present

## 2017-12-08 DIAGNOSIS — I509 Heart failure, unspecified: Secondary | ICD-10-CM | POA: Diagnosis not present

## 2017-12-08 DIAGNOSIS — M6281 Muscle weakness (generalized): Secondary | ICD-10-CM | POA: Diagnosis not present

## 2017-12-08 DIAGNOSIS — Z0000 Encounter for general adult medical examination without abnormal findings: Secondary | ICD-10-CM | POA: Diagnosis not present

## 2017-12-14 ENCOUNTER — Other Ambulatory Visit: Payer: Self-pay | Admitting: Internal Medicine

## 2018-01-23 DIAGNOSIS — R7303 Prediabetes: Secondary | ICD-10-CM | POA: Diagnosis not present

## 2018-01-23 DIAGNOSIS — I509 Heart failure, unspecified: Secondary | ICD-10-CM | POA: Diagnosis not present

## 2018-01-23 DIAGNOSIS — E785 Hyperlipidemia, unspecified: Secondary | ICD-10-CM | POA: Diagnosis not present

## 2018-01-23 DIAGNOSIS — I10 Essential (primary) hypertension: Secondary | ICD-10-CM | POA: Diagnosis not present

## 2018-02-08 ENCOUNTER — Other Ambulatory Visit: Payer: Self-pay | Admitting: Oncology

## 2018-02-11 ENCOUNTER — Other Ambulatory Visit: Payer: Self-pay | Admitting: Internal Medicine

## 2018-02-11 ENCOUNTER — Ambulatory Visit: Payer: Medicare Other

## 2018-02-11 DIAGNOSIS — E559 Vitamin D deficiency, unspecified: Secondary | ICD-10-CM

## 2018-02-22 ENCOUNTER — Ambulatory Visit (INDEPENDENT_AMBULATORY_CARE_PROVIDER_SITE_OTHER): Payer: Medicare Other | Admitting: *Deleted

## 2018-02-22 ENCOUNTER — Other Ambulatory Visit: Payer: Self-pay | Admitting: Internal Medicine

## 2018-02-22 ENCOUNTER — Telehealth: Payer: Self-pay | Admitting: *Deleted

## 2018-02-22 VITALS — BP 143/82 | HR 85 | Resp 18 | Ht 62.0 in | Wt 177.0 lb

## 2018-02-22 DIAGNOSIS — Z23 Encounter for immunization: Secondary | ICD-10-CM | POA: Diagnosis not present

## 2018-02-22 DIAGNOSIS — Z0000 Encounter for general adult medical examination without abnormal findings: Secondary | ICD-10-CM

## 2018-02-22 DIAGNOSIS — H9201 Otalgia, right ear: Principal | ICD-10-CM

## 2018-02-22 DIAGNOSIS — G8929 Other chronic pain: Secondary | ICD-10-CM | POA: Insufficient documentation

## 2018-02-22 NOTE — Progress Notes (Addendum)
Subjective:   [name] is a 84 y.o. female who presents for Medicare Annual (Subsequent) preventive examination.  Review of Systems:  No ROS.  Medicare Wellness Visit. Additional risk factors are reflected in the social history.  Cardiac Risk Factors include: advanced age (>33men, >61 women);dyslipidemia Sleep patterns: gets up 1 times nightly to void and sleeps 7-8 hours nightly.    Home Safety/Smoke Alarms: Feels safe in home. Smoke alarms in place.  Living environment; residence and Firearm Safety: 1-story house/ trailer, no firearms. Lives with husband of which she is primary caretaker, no needs for DME, good support system Seat Belt Safety/Bike Helmet: Wears seat belt.      Objective:     Vitals: BP (!) 143/82   Pulse 85   Resp 18   Ht 5\' 2"  (1.575 m)   Wt 177 lb (80.3 kg)   SpO2 99%   BMI 32.37 kg/m   Body mass index is 32.37 kg/m.  Advanced Directives 02/22/2018 07/30/2016 07/10/2015 05/09/2015 05/01/2014 05/01/2014 05/05/2013  Does Patient Have a Medical Advance Directive? No Yes Yes No No No Patient does not have advance directive;Patient would not like information  Type of Advance Directive - [place];Living will [place];Living will - - - -  Does patient want to make changes to medical advance directive? - - No - Patient declined - - - -  Copy of [place] in Chart? - No - copy requested Yes - - - -  Would patient like information on creating a medical advance directive? Yes (ED - Information included in AVS) - - - - - -    Tobacco Social History   Tobacco Use  Smoking Status Never Smoker  Smokeless Tobacco Never Used     Counseling given: Not Answered  Past Medical History:  Diagnosis Date  . Anemia   . Arthritis   . Blind   . BLINDNESS, [place], [place] DEFINITION 12/14/2006  . Cancer ([place])    breast  . CONSTIPATION 12/14/2006  . Edema   . EXTERNAL HEMORRHOIDS 09/30/2007  . GERD 10/01/2007  .  HYPERLIPIDEMIA 12/14/2006  . OOPHORECTOMY, RIGHT, HX OF 12/14/2006  . OSTEOPENIA 02/07/2008  . RECTAL BLEEDING 10/01/2007  . ROTATOR CUFF REPAIR, RIGHT, HX OF 12/14/2006  . VITAMIN D DEFICIENCY 01/25/2009   Past Surgical History:  Procedure Laterality Date  . ABDOMINAL HYSTERECTOMY    . IRRIGATION AND DEBRIDEMENT SEBACEOUS CYST  06/07   [name]  . LE Art Doppler   '98   normal  . Nuc Stress test   07/07   normal study  . PARTIAL MASTECTOMY WITH NEEDLE LOCALIZATION Left 05/11/2013   Procedure: PARTIAL MASTECTOMY WITH NEEDLE LOCALIZATION;  Surgeon: [name], MD;  Location: [place];  Service: General;  Laterality: Left;  . RIGHT OOPHORECTOMY    . ROTATOR CUFF REPAIR     right  . TUBAL LIGATION     Family History  Problem Relation Age of Onset  . Hypertension Father   . Dementia Father   . Cancer Neg Hx        Negative for breast or colon cancer  . Heart disease Neg Hx    Social History   Socioeconomic History  . Marital status: Married    Spouse name: Not on file  . Number of children: 5  . Years of education: 40  . Highest education level: Not on file  Occupational History  . Not on file  Social Needs  . Financial resource strain: Not hard at all  . Food insecurity:    Worry: Never true    Inability: Never true  . Transportation needs:    Medical: No    Non-medical: No  Tobacco Use  . Smoking status: Never Smoker  . Smokeless tobacco: Never Used  Substance and Sexual Activity  . Alcohol use: No  . Drug use: No  . Sexual activity: Not Currently  Lifestyle  . Physical activity:    Days per week: 0 days    Minutes per session: 0 min  . Stress: To some extent  Relationships  . Social connections:    Talks on phone: More than three times a week    Gets together: More than three times a week    Attends religious service: 1 to 4 times per year    Active member of club or organization: Yes    Attends meetings of clubs or organizations: 1 to  4 times per year    Relationship status: Married  Other Topics Concern  . Not on file  Social History Narrative   HSG   Married '61   3 sons- '62, '64, '65; 2 daughters- '60, '66: 5 grandchildren   Lives with husband   Support family-close at hand          Outpatient Encounter Medications as of 02/22/2018  Medication Sig  . Cholecalciferol (VITAMIN D) 50 MCG (2000 UT) tablet TAKE 2 TABLETS (4,000 UNITS TOTAL) BY MOUTH DAILY. (Patient taking differently: 1 Units daily. )  . ibuprofen (ADVIL,MOTRIN) 200 MG tablet Take 200 mg by mouth every 6 (six) hours as needed.  . pravastatin (PRAVACHOL) 40 MG tablet TAKE 1 TABLET BY MOUTH EVERYDAY AT BEDTIME  . tamoxifen (NOLVADEX) 20 MG tablet TAKE 1 TABLET BY MOUTH EVERY DAY  . torsemide (DEMADEX) 10 MG tablet Take 1 tablet (10 mg total) by mouth daily. (Patient taking differently: Take 10 mg by mouth as needed. )   No facility-administered encounter medications on file as of 02/22/2018.     Activities of Daily Living In your present state of health, do you have any difficulty performing the following activities: 02/22/2018  Hearing? N  Vision? N  Difficulty concentrating or making decisions? N  Walking or climbing stairs? N  Dressing or bathing? N  Doing errands, shopping? N  Preparing Food and eating ? N  Using the Toilet? N  In the past six months, have you accidently leaked urine? N  Do you have problems with loss of bowel control? N  Managing your Medications? N  Managing your Finances? N  Housekeeping or managing your Housekeeping? N  Some recent data might be hidden    Patient Care Team: Janith Lima, MD as PCP - General (Internal Medicine) Newton Pigg, MD as Consulting Physician (Obstetrics and Gynecology)    Assessment:   This is a routine wellness examination for [facility]. Physical assessment deferred to PCP.   Exercise Activities and Dietary recommendations Current Exercise Habits: The patient does not participate in  regular exercise at present, Exercise limited by: orthopedic condition(s)  Diet (meal preparation, eat out, water intake, caffeinated beverages, dairy products, fruits and vegetables): in general, a "healthy" diet  , well balanced   Reviewed heart healthy diet. Encouraged patient to increase daily water and healthy fluid intake.  Goals    . Patient Stated     Maintain current health status.       Fall Risk Fall Risk  02/22/2018 08/11/2017 08/11/2017 07/30/2016 07/10/2015  Falls in the past year? 0 No No No No  Risk for fall due to : Impaired vision - - - -  Follow up Falls prevention discussed - - - -    Depression Screen PHQ 2/9 Scores 02/22/2018 08/11/2017 07/30/2016 07/10/2015  PHQ - 2 Score 1 0 0 0  PHQ- 9 Score 2 - - -     Cognitive Function MMSE - Mini Mental State Exam 02/22/2018  Not completed: Unable to complete       Ad8 score reviewed for issues:  Issues making decisions: no  Less interest in hobbies / activities: no  Repeats questions, stories (family complaining): no  Trouble using ordinary gadgets (microwave, computer, phone):no  Forgets the month or year: no  Mismanaging finances: no  Remembering appts: no  Daily problems with thinking and/or memory: no Ad8 score is= 0  Immunization History  Administered Date(s) Administered  . Influenza, High Dose Seasonal PF 02/20/2016, 02/04/2017, 02/22/2018  . Pneumococcal Conjugate-13 07/04/2014  . Pneumococcal Polysaccharide-23 02/06/2009, 07/10/2015  . Tdap 07/04/2014  . Tetanus 06/30/2012   Screening Tests Health Maintenance  Topic Date Due  . TETANUS/TDAP  07/03/2024  . INFLUENZA VACCINE  Completed  . DEXA SCAN  Completed  . PNA vac Low Risk Adult  Completed      Plan:    Continue doing brain stimulating activities (puzzles, reading, adult coloring books, staying active) to keep memory sharp.   Continue to eat heart healthy diet (full of fruits, vegetables, whole grains, lean protein, water--limit  salt, fat, and sugar intake) and increase physical activity as tolerated.   I have personally reviewed and noted the following in the patient's chart:   . Medical and social history . Use of alcohol, tobacco or illicit drugs  . Current medications and supplements . Functional ability and status . Nutritional status . Physical activity . Advanced directives . List of other physicians . Vitals . Screenings to include cognitive, depression, and falls . Referrals and appointments  In addition, I have reviewed and discussed with patient certain preventive protocols, quality metrics, and best practice recommendations. A written personalized care plan for preventive services as well as general preventive health recommendations were provided to patient.    Medical screening examination/treatment/procedure(s) were performed by non-physician practitioner and as supervising physician I was immediately available for consultation/collaboration. I agree with above. Scarlette Calico, MD    Michiel Cowboy, RN  02/22/2018

## 2018-02-22 NOTE — Patient Instructions (Addendum)
Continue doing brain stimulating activities (puzzles, reading, adult coloring books, staying active) to keep memory sharp.   Continue to eat heart healthy diet (full of fruits, vegetables, whole grains, lean protein, water--limit salt, fat, and sugar intake) and increase physical activity as tolerated.   [name] , Thank you for taking time to come for your Medicare Wellness Visit. I appreciate your ongoing commitment to your health goals. Please review the following plan we discussed and let me know if I can assist you in the future.   These are the goals we discussed: Goals    . Patient Stated     Maintain current health status.       This is a list of the screening recommended for you and due dates:  Health Maintenance  Topic Date Due  . Flu Shot  10/22/2017  . Tetanus Vaccine  07/03/2024  . DEXA scan (bone density measurement)  Completed  . Pneumonia vaccines  Completed   Influenza Virus Vaccine injection What is this medicine? INFLUENZA VIRUS VACCINE (in floo EN zuh VAHY ruhs vak SEEN) helps to reduce the risk of getting influenza also known as the flu. The vaccine only helps protect you against some strains of the flu. This medicine may be used for other purposes; ask your health care provider or pharmacist if you have questions. COMMON BRAND NAME(S): Afluria, Agriflu, Alfuria, FLUAD, Fluarix, Fluarix Quadrivalent, Flublok, Flublok Quadrivalent, FLUCELVAX, Flulaval, Fluvirin, Fluzone, Fluzone High-Dose, Fluzone Intradermal What should I tell my health care provider before I take this medicine? They need to know if you have any of these conditions: -bleeding disorder like hemophilia -fever or infection -Guillain-Barre syndrome or other neurological problems -immune system problems -infection with the human immunodeficiency virus (HIV) or AIDS -low blood platelet counts -multiple sclerosis -an unusual or allergic reaction to influenza virus vaccine, latex, other  medicines, foods, dyes, or preservatives. Different brands of vaccines contain different allergens. Some may contain latex or eggs. Talk to your doctor about your allergies to make sure that you get the right vaccine. -pregnant or trying to get pregnant -breast-feeding How should I use this medicine? This vaccine is for injection into a muscle or under the skin. It is given by a health care professional. A copy of Vaccine Information Statements will be given before each vaccination. Read this sheet carefully each time. The sheet may change frequently. Talk to your healthcare provider to see which vaccines are right for you. Some vaccines should not be used in all age groups. Overdosage: If you think you have taken too much of this medicine contact a poison control center or emergency room at once. NOTE: This medicine is only for you. Do not share this medicine with others. What if I miss a dose? This does not apply. What may interact with this medicine? -chemotherapy or radiation therapy -medicines that lower your immune system like etanercept, anakinra, infliximab, and adalimumab -medicines that treat or prevent blood clots like warfarin -phenytoin -steroid medicines like prednisone or cortisone -theophylline -vaccines This list may not describe all possible interactions. Give your health care provider a list of all the medicines, herbs, non-prescription drugs, or dietary supplements you use. Also tell them if you smoke, drink alcohol, or use illegal drugs. Some items may interact with your medicine. What should I watch for while using this medicine? Report any side effects that do not go away within 3 days to your doctor or health care professional. Call your health care provider if any unusual symptoms  occur within 6 weeks of receiving this vaccine. You may still catch the flu, but the illness is not usually as bad. You cannot get the flu from the vaccine. The vaccine will not protect  against colds or other illnesses that may cause fever. The vaccine is needed every year. What side effects may I notice from receiving this medicine? Side effects that you should report to your doctor or health care professional as soon as possible: -allergic reactions like skin rash, itching or hives, swelling of the face, lips, or tongue Side effects that usually do not require medical attention (report to your doctor or health care professional if they continue or are bothersome): -fever -headache -muscle aches and pains -pain, tenderness, redness, or swelling at the injection site -tiredness This list may not describe all possible side effects. Call your doctor for medical advice about side effects. You may report side effects to FDA at 1-800-FDA-1088. Where should I keep my medicine? The vaccine will be given by a health care professional in a clinic, pharmacy, doctor's office, or other health care setting. You will not be given vaccine doses to store at home. NOTE: This sheet is a summary. It may not cover all possible information. If you have questions about this medicine, talk to your doctor, pharmacist, or health care provider.  2018 Elsevier/Gold Standard (2014-09-29 10:07:28)

## 2018-02-22 NOTE — Telephone Encounter (Signed)
During AWV, patient stated that her right ear frequently gets stopped up and asked if PCP could refer her to a ENT physician to be evaluated.

## 2018-03-25 DIAGNOSIS — Z86000 Personal history of in-situ neoplasm of breast: Secondary | ICD-10-CM | POA: Diagnosis not present

## 2018-03-25 DIAGNOSIS — E785 Hyperlipidemia, unspecified: Secondary | ICD-10-CM | POA: Diagnosis not present

## 2018-03-25 DIAGNOSIS — I10 Essential (primary) hypertension: Secondary | ICD-10-CM | POA: Diagnosis not present

## 2018-03-25 DIAGNOSIS — I509 Heart failure, unspecified: Secondary | ICD-10-CM | POA: Diagnosis not present

## 2018-03-26 ENCOUNTER — Encounter: Payer: Self-pay | Admitting: Internal Medicine

## 2018-05-01 ENCOUNTER — Other Ambulatory Visit: Payer: Self-pay | Admitting: Internal Medicine

## 2018-05-01 DIAGNOSIS — R600 Localized edema: Secondary | ICD-10-CM

## 2018-05-01 DIAGNOSIS — I10 Essential (primary) hypertension: Secondary | ICD-10-CM

## 2018-05-18 DIAGNOSIS — Z853 Personal history of malignant neoplasm of breast: Secondary | ICD-10-CM | POA: Diagnosis not present

## 2018-05-18 LAB — HM MAMMOGRAPHY

## 2018-05-26 ENCOUNTER — Inpatient Hospital Stay: Payer: Medicare Other

## 2018-05-26 ENCOUNTER — Inpatient Hospital Stay: Payer: Medicare Other | Admitting: Oncology

## 2018-06-15 ENCOUNTER — Other Ambulatory Visit: Payer: Self-pay | Admitting: Internal Medicine

## 2018-07-12 ENCOUNTER — Telehealth: Payer: Self-pay | Admitting: Oncology

## 2018-07-12 NOTE — Progress Notes (Signed)
[name]  [phone] Fax:([phone]) [phone]     ID: KALYSTA KNEISLEY OB: [birth year]  MR#: [id number]  QJF#:[id number]  Patient Care Team: Janith Lima, MD as PCP - General (Internal Medicine) Newton Pigg, MD as Consulting Physician (Obstetrics and Gynecology) Broly Hatfield, Virgie Dad, MD as Consulting Physician (Oncology) Fanny Skates, MD as Consulting Physician (General Surgery) Gery Pray, MD as Consulting Physician (Radiation Oncology) OTHER MD:   CHIEF COMPLAINT: estrogen receptor positive breast cancer  CURRENT TREATMENT: Completed 5 years of tamoxifen   I connected with [name] on 07/13/18 at  2:00 PM EDT by telephone visit and verified that I am speaking with the correct person using two identifiers.   I discussed the limitations, risks, security and privacy concerns of performing an evaluation and management service by telemedicine and the availability of in-person appointments. I also discussed with the patient that there may be a patient responsible charge related to this service. The patient expressed understanding and agreed to proceed.   Other persons participating in the visit and their role in the encounter:   - Biomedical scientist, Medical Scribe   Patient's location: home  Provider's location: [facility]   Chief Complaint: estrogen receptor positive breast cancer    HISTORY OF PRESENT ILLNESS: From the original intake note:  [name] had routine screening mammography at [facility] 03/19/2012 showing a potential abnormality in the left breast. Additional views 03/26/2012 showed no discrete mass or architectural distortion. Left breast ultrasonography was negative.  Short interval followup was suggested and done 09/30/2012 the patient had left diagnostic mammography which showed no concerning change.  Bilateral diagnostic mammography 03/22/2013 showed the patient's breast density to be category B. There was an irregular  focal asymmetry in the left breast at 8:00, however ultrasound of this area showed no corresponding abnormality. Accordingly a stereotactic biopsy of the area in question in the left breast was performed 03/28/2013, and showed ([id number]) an invasive ductal carcinoma, with lobular features, grade 1, estrogen receptor 99% positive, progesterone receptor 100% positive, both with strong staining intensity, with an MIB-1 of 6%, and no HER-2 amplification, the signals ratio being 1.35 and the copy number per cell 1.75.  MRI of the breast was obtained 04/05/2013. This confirmed a 2 cm irregular spiculated mass in the upper inner quadrant of the left breast with no additional areas of concern in that breast. In the right breast however at the 12:00 position there was a 1.3 cm irregular spiculated mass. There were 2 oval enhancing masses in the right breast felt consistent with intramammary lymph nodes. There were no abnormal appearing lymph nodes otherwise.  The patient's subsequent history is as detailed below   INTERVAL HISTORY: I contacted [name] day for follow-up and treatment of her estrogen receptor positive breast cancer.   She ran out of tamoxifen in December and did not have it renewed since she knew we were going to be completing 5 years with today's visit.  She did not notice any change going off the medication.  She had tolerated it well, without significant problems with hot flashes or vaginal discharge.  Since her last visit here, she underwent a digital diagnostic bilateral mammogram on 05/18/2018 showing: Breast Density Category B. There is no mammographic evidence of malignancy.   REVIEW OF SYSTEMS: [name] is being very careful following COVID precautions and pretty much staying at home with her husband.  Their daughter lives with them as well, she works from home, and she does  the grocery shopping.  [name] denies unusual headaches, visual changes, nausea, vomiting, or dizziness. There has been  no unusual cough, phlegm production, or pleurisy. This been no change in bowel or bladder habits. The patient denies unexplained fatigue or unexplained weight loss, bleeding, rash, or fever. A detailed review of systems was otherwise noncontributory.   PAST MEDICAL HISTORY: Past Medical History:  Diagnosis Date  . Anemia   . Arthritis   . Blind   . BLINDNESS, [place], [place] DEFINITION 12/14/2006  . Cancer ([place])    breast  . CONSTIPATION 12/14/2006  . Edema   . EXTERNAL HEMORRHOIDS 09/30/2007  . GERD 10/01/2007  . HYPERLIPIDEMIA 12/14/2006  . OOPHORECTOMY, RIGHT, HX OF 12/14/2006  . OSTEOPENIA 02/07/2008  . RECTAL BLEEDING 10/01/2007  . ROTATOR CUFF REPAIR, RIGHT, HX OF 12/14/2006  . VITAMIN D DEFICIENCY 01/25/2009    PAST SURGICAL HISTORY: Past Surgical History:  Procedure Laterality Date  . ABDOMINAL HYSTERECTOMY    . IRRIGATION AND DEBRIDEMENT SEBACEOUS CYST  06/07   [name]  . LE Art Doppler   '98   normal  . Nuc Stress test   07/07   normal study  . PARTIAL MASTECTOMY WITH NEEDLE LOCALIZATION Left 05/11/2013   Procedure: PARTIAL MASTECTOMY WITH NEEDLE LOCALIZATION;  Surgeon: [name], MD;  Location: [facility];  Service: General;  Laterality: Left;  . RIGHT OOPHORECTOMY    . ROTATOR CUFF REPAIR     right  . TUBAL LIGATION      FAMILY HISTORY Family History  Problem Relation Age of Onset  . Hypertension Father   . Dementia Father   . Cancer Neg Hx        Negative for breast or colon cancer  . Heart disease Neg Hx    The patient's father was murdered at the age of 5. The patient's mother died in her sleep at the age of 39. The patient has 3 brothers, 6 sisters. She has one cousin with breast cancer, but does not know at what age she was diagnosed. She also has a cousin with ovarian cancer, but again does not know at what age it was diagnosed.   GYNECOLOGIC HISTORY:  Menarche age 41, first live birth age 77, the patient is [place] P5. She went through the  change of life at age 68. She did not use hormone replacement.    SOCIAL HISTORY:  [name] used to work at [employer], but is now retired. Her husband "[name]" is also blind. He suffers from significant Alzheimer's disease. The patient lives with her husband and 2 grandchildren, Denny Peon, 53, and 32, 2. The patient's children are Sunday Spillers, who works in [place] in Programmer, applications for a Theatre manager; Trilby Drummer who works in [place] as a Curator; Surveyor, mining, who works in [place] in Psychologist, educational; Alfonzo, who also lives in [place]; and Chester, who is a Education officer, environmental. The patient has 6 grandchildren, four great-grandchildren. She attends a local [place]: Not in place; the patient intends to name her daughter Peter Congo as healthcare power of attorney. Peter Congo can be reached at [place]: Social History   Tobacco Use  . Smoking status: Never Smoker  . Smokeless tobacco: Never Used  Substance Use Topics  . Alcohol use: No  . Drug use: No    Colonoscopy:  PAP:  Bone density: 04/07/2013 at [facility] showed osteopenia with a T score of -2.2 at the left femoral neck  Lipid panel:  Allergies  Allergen Reactions  . Sulfa Antibiotics Other (See Comments)    headaches    Current Outpatient Medications  Medication Sig Dispense Refill  . Cholecalciferol (VITAMIN D) 50 MCG (2000 UT) tablet TAKE 2 TABLETS (4,000 UNITS TOTAL) BY MOUTH DAILY. (Patient taking differently: 1 Units daily. ) 180 tablet 1  . ibuprofen (ADVIL,MOTRIN) 200 MG tablet Take 200 mg by mouth every 6 (six) hours as needed.    . pravastatin (PRAVACHOL) 40 MG tablet TAKE 1 TABLET BY MOUTH EVERYDAY AT BEDTIME 90 tablet 1  . tamoxifen (NOLVADEX) 20 MG tablet TAKE 1 TABLET BY MOUTH EVERY DAY 90 tablet 0  . torsemide (DEMADEX) 10 MG tablet Take 1 tablet (10 mg total) by mouth as needed. 90 tablet 1   No current facility-administered medications for this visit.      OBJECTIVE: Older African American woman   There were no vitals filed for this visit.   There is no height or weight on file to calculate BMI.    ECOG FS:1 - Symptomatic but completely ambulatory  LAB RESULTS:  CMP     Component Value Date/Time   NA 146 (H) 10/16/2017 1100   NA 141 05/09/2015 0929   K 3.7 10/16/2017 1100   K 4.0 05/09/2015 0929   CL 111 10/16/2017 1100   CO2 28 10/16/2017 1100   CO2 24 05/09/2015 0929   GLUCOSE 101 (H) 10/16/2017 1100   GLUCOSE 106 05/09/2015 0929   BUN 15 10/16/2017 1100   BUN 13.9 05/09/2015 0929   CREATININE 0.91 10/16/2017 1100   CREATININE 0.91 05/28/2017 1253   CREATININE 0.9 05/09/2015 0929   CALCIUM 9.5 10/16/2017 1100   CALCIUM 10.0 05/09/2015 0929   PROT 7.1 10/16/2017 1100   PROT 8.1 05/09/2015 0929   ALBUMIN 3.7 10/16/2017 1100   ALBUMIN 4.0 05/09/2015 0929   AST 21 10/16/2017 1100   AST 18 05/28/2017 1253   AST 22 05/09/2015 0929   ALT 13 10/16/2017 1100   ALT 10 05/28/2017 1253   ALT 14 05/09/2015 0929   ALKPHOS 50 10/16/2017 1100   ALKPHOS 59 05/09/2015 0929   BILITOT 0.5 10/16/2017 1100   BILITOT 0.4 05/28/2017 1253   BILITOT 0.35 05/09/2015 0929   GFRNONAA 57 (L) 10/16/2017 1100   GFRNONAA 58 (L) 05/28/2017 1253   GFRAA >60 10/16/2017 1100   GFRAA >60 05/28/2017 1253    I No results found for: SPEP  Lab Results  Component Value Date   WBC 6.2 10/27/2017   NEUTROABS 3.6 10/27/2017   HGB 10.6 (L) 10/27/2017   HCT 31.7 (L) 10/27/2017   MCV 84.2 10/27/2017   PLT 259.0 10/27/2017      Chemistry      Component Value Date/Time   NA 146 (H) 10/16/2017 1100   NA 141 05/09/2015 0929   K 3.7 10/16/2017 1100   K 4.0 05/09/2015 0929   CL 111 10/16/2017 1100   CO2 28 10/16/2017 1100   CO2 24 05/09/2015 0929   BUN 15 10/16/2017 1100   BUN 13.9 05/09/2015 0929   CREATININE 0.91 10/16/2017 1100   CREATININE 0.91 05/28/2017 1253   CREATININE 0.9 05/09/2015 0929      Component Value Date/Time   CALCIUM 9.5  10/16/2017 1100   CALCIUM 10.0 05/09/2015 0929   ALKPHOS 50 10/16/2017 1100   ALKPHOS 59 05/09/2015 0929   AST 21 10/16/2017 1100   AST 18 05/28/2017 1253   AST 22 05/09/2015 0929   ALT 13 10/16/2017 1100  ALT 10 05/28/2017 1253   ALT 14 05/09/2015 0929   BILITOT 0.5 10/16/2017 1100   BILITOT 0.4 05/28/2017 1253   BILITOT 0.35 05/09/2015 0929       No results found for: LABCA2  No components found for: LABCA125  No results for input(s): INR in the last 168 hours.  Urinalysis    Component Value Date/Time   COLORURINE YELLOW 07/06/2013 1135   APPEARANCEUR CLEAR 07/06/2013 1135   LABSPEC 1.020 07/06/2013 1135   PHURINE 5.5 07/06/2013 1135   GLUCOSEU NEGATIVE 07/06/2013 1135   HGBUR TRACE-LYSED (A) 07/06/2013 1135   BILIRUBINUR NEGATIVE 07/06/2013 1135   KETONESUR NEGATIVE 07/06/2013 1135   PROTEINUR NEGATIVE 05/09/2013 0848   UROBILINOGEN 0.2 07/06/2013 1135   NITRITE NEGATIVE 07/06/2013 1135   LEUKOCYTESUR NEGATIVE 07/06/2013 1135    STUDIES: No results found.     ASSESSMENT: 85 y.o. [place] woman status post left breast biopsy 03/28/2013 for a clinical T1c N0, stage IA invasive (lobular) carcinoma, grade 1, estrogen and progesterone receptor positive, HER-2 negative, with an MIB-1 of 6%  (1) a 1.3 cm lesion noted by MRI in the right breast was proven benign by biopsy 04/20/2013  (2) status post left lumpectomy without salpingo-oophorectomy 05/11/2013 for a pT1c cN0, stage IA invasive ductal carcinoma, grade 1, repeat HER-2 again negative, with ample margins  (3) the patient was evaluated by radiation oncology, who felt if she took anti-estrogens it would be safe for her to forego radiation  (4) tamoxifen started April 2015, completing nearly 5 years December 2019  (5) osteopenia, T score of -2.25 March 2013, repeat January 2017 was -1.6   PLAN: Arturo is now just over 5 years out from definitive surgery for her breast cancer with no evidence of disease  recurrence.  This is very favorable.    She tolerated tamoxifen well and had pretty much 5 years on that medication.  The net effect of that is to cut in half her risk of breast cancer recurrence and also her risk of developing a new breast cancer in the future.  At this point I feel comfortable releasing her to her primary care physician.  All she will need in terms of breast cancer follow-up is a yearly mammogram and her yearly physician breast exam.  We discussed survivorship issues and she is not interested in participating in the program here  I will be glad to see Oliviana again at any point in the future if and when the need arises but as of now are making no further routine appointments for her here    Chauncey Cruel, MD  07/13/18 2:11 PM Medical Oncology and Hematology [facility] [place], [place] [address] Tel. [phone]    Fax. [phone]  I, Jacqualyn Posey am acting as a Education administrator for Chauncey Cruel, MD.   I, Lurline Del MD, have reviewed the above documentation for accuracy and completeness, and I agree with the above.

## 2018-07-12 NOTE — Telephone Encounter (Signed)
Spoke with patient and she does not have the capabilities to [employer]. Patient is fine with a phone call visit.

## 2018-07-13 ENCOUNTER — Inpatient Hospital Stay: Payer: Medicare Other | Attending: Oncology | Admitting: Oncology

## 2018-07-13 ENCOUNTER — Encounter: Payer: Self-pay | Admitting: Oncology

## 2018-07-13 ENCOUNTER — Other Ambulatory Visit: Payer: Medicare Other

## 2018-07-13 DIAGNOSIS — Z170 Estrogen receptor positive status [ER+]: Secondary | ICD-10-CM

## 2018-07-13 DIAGNOSIS — M8580 Other specified disorders of bone density and structure, unspecified site: Secondary | ICD-10-CM | POA: Diagnosis not present

## 2018-07-13 DIAGNOSIS — C50312 Malignant neoplasm of lower-inner quadrant of left female breast: Secondary | ICD-10-CM | POA: Diagnosis not present

## 2018-11-02 ENCOUNTER — Encounter: Payer: Self-pay | Admitting: Internal Medicine

## 2018-11-02 ENCOUNTER — Ambulatory Visit (INDEPENDENT_AMBULATORY_CARE_PROVIDER_SITE_OTHER): Payer: Medicare Other | Admitting: Internal Medicine

## 2018-11-02 ENCOUNTER — Other Ambulatory Visit (INDEPENDENT_AMBULATORY_CARE_PROVIDER_SITE_OTHER): Payer: Medicare Other

## 2018-11-02 ENCOUNTER — Other Ambulatory Visit: Payer: Self-pay

## 2018-11-02 VITALS — BP 140/70 | HR 97 | Temp 98.7°F | Ht 62.0 in | Wt 171.0 lb

## 2018-11-02 DIAGNOSIS — R109 Unspecified abdominal pain: Secondary | ICD-10-CM

## 2018-11-02 DIAGNOSIS — E785 Hyperlipidemia, unspecified: Secondary | ICD-10-CM

## 2018-11-02 DIAGNOSIS — E559 Vitamin D deficiency, unspecified: Secondary | ICD-10-CM

## 2018-11-02 DIAGNOSIS — R7303 Prediabetes: Secondary | ICD-10-CM

## 2018-11-02 DIAGNOSIS — I10 Essential (primary) hypertension: Secondary | ICD-10-CM

## 2018-11-02 DIAGNOSIS — G8929 Other chronic pain: Secondary | ICD-10-CM | POA: Insufficient documentation

## 2018-11-02 DIAGNOSIS — D510 Vitamin B12 deficiency anemia due to intrinsic factor deficiency: Secondary | ICD-10-CM

## 2018-11-02 DIAGNOSIS — Z0000 Encounter for general adult medical examination without abnormal findings: Secondary | ICD-10-CM | POA: Diagnosis not present

## 2018-11-02 DIAGNOSIS — D539 Nutritional anemia, unspecified: Secondary | ICD-10-CM | POA: Diagnosis not present

## 2018-11-02 LAB — BASIC METABOLIC PANEL
BUN: 12 mg/dL (ref 6–23)
CO2: 29 mEq/L (ref 19–32)
Calcium: 9.6 mg/dL (ref 8.4–10.5)
Chloride: 106 mEq/L (ref 96–112)
Creatinine, Ser: 0.81 mg/dL (ref 0.40–1.20)
GFR: 81.66 mL/min (ref >60.00)
Glucose, Bld: 98 mg/dL (ref 70–99)
Potassium: 3.6 mEq/L (ref 3.5–5.1)
Sodium: 142 mEq/L (ref 135–145)

## 2018-11-02 LAB — TSH: TSH: 1.49 u[IU]/mL (ref 0.35–4.50)

## 2018-11-02 LAB — HEPATIC FUNCTION PANEL
ALT: 12 U/L (ref 0–35)
AST: 18 U/L (ref 0–37)
Albumin: 4.3 g/dL (ref 3.5–5.2)
Alkaline Phosphatase: 70 U/L (ref 39–117)
Bilirubin, Direct: 0.1 mg/dL (ref 0.0–0.3)
Total Bilirubin: 0.4 mg/dL (ref 0.2–1.2)
Total Protein: 7.4 g/dL (ref 6.0–8.3)

## 2018-11-02 LAB — CBC WITH DIFFERENTIAL/PLATELET
Basophils Absolute: 0 10*3/uL (ref 0.0–0.1)
Basophils Relative: 0.5 % (ref 0.0–3.0)
Eosinophils Absolute: 0 10*3/uL (ref 0.0–0.7)
Eosinophils Relative: 0.7 % (ref 0.0–5.0)
HCT: 34.7 % — ABNORMAL LOW (ref 36.0–46.0)
Hemoglobin: 11.4 g/dL — ABNORMAL LOW (ref 12.0–15.0)
Lymphocytes Relative: 33.1 % (ref 12.0–46.0)
Lymphs Abs: 1.6 10*3/uL (ref 0.7–4.0)
MCHC: 32.8 g/dL (ref 30.0–36.0)
MCV: 84.9 fl (ref 78.0–100.0)
Monocytes Absolute: 0.4 10*3/uL (ref 0.1–1.0)
Monocytes Relative: 7.4 % (ref 3.0–12.0)
Neutro Abs: 2.7 10*3/uL (ref 1.4–7.7)
Neutrophils Relative %: 58.3 % (ref 43.0–77.0)
Platelets: 223 10*3/uL (ref 150.0–400.0)
RBC: 4.09 Mil/uL (ref 3.87–5.11)
RDW: 15.2 % (ref 11.5–15.5)
WBC: 4.7 10*3/uL (ref 4.0–10.5)

## 2018-11-02 LAB — LIPID PANEL
Cholesterol: 189 mg/dL (ref 0–200)
HDL: 74.1 mg/dL (ref >39.00)
LDL Cholesterol: 92 mg/dL (ref 0–99)
NonHDL: 115.25
Total CHOL/HDL Ratio: 3
Triglycerides: 118 mg/dL (ref 0.0–149.0)
VLDL: 23.6 mg/dL (ref 0.0–40.0)

## 2018-11-02 LAB — IBC PANEL
Iron: 75 ug/dL (ref 42–145)
Saturation Ratios: 23.7 % (ref 20.0–50.0)
Transferrin: 226 mg/dL (ref 212.0–360.0)

## 2018-11-02 LAB — VITAMIN B12: Vitamin B-12: 179 pg/mL — ABNORMAL LOW (ref 211–911)

## 2018-11-02 LAB — URINALYSIS, ROUTINE W REFLEX MICROSCOPIC
Bilirubin Urine: NEGATIVE
Hgb urine dipstick: NEGATIVE
Ketones, ur: NEGATIVE
Nitrite: NEGATIVE
Specific Gravity, Urine: 1.025 (ref 1.000–1.030)
Total Protein, Urine: NEGATIVE
Urine Glucose: NEGATIVE
Urobilinogen, UA: 0.2 (ref 0.0–1.0)
pH: 6 (ref 5.0–8.0)

## 2018-11-02 LAB — FERRITIN: Ferritin: 133.8 ng/mL (ref 10.0–291.0)

## 2018-11-02 LAB — HEMOGLOBIN A1C: Hgb A1c MFr Bld: 6.1 % (ref 4.6–6.5)

## 2018-11-02 LAB — VITAMIN D 25 HYDROXY (VIT D DEFICIENCY, FRACTURES): VITD: 33.68 ng/mL (ref 30.00–100.00)

## 2018-11-02 LAB — FOLATE: Folate: 9.8 ng/mL (ref >5.9)

## 2018-11-02 NOTE — Patient Instructions (Signed)
Flank Pain, Adult Flank pain is pain that is located on the side of the body between the upper abdomen and the back. This area is called the flank. The pain may occur over a short period of time (acute), or it may be long-term or recurring (chronic). It may be mild or severe. Flank pain can be caused by many things, including:  Muscle soreness or injury.  Kidney stones or kidney disease.  Stress.  A disease of the spine (vertebral disk disease).  A lung infection (pneumonia).  Fluid around the lungs (pulmonary edema).  A skin rash caused by the chickenpox virus (shingles).  Tumors that affect the back of the abdomen.  Gallbladder disease. Follow these instructions at home:   Drink enough fluid to keep your urine clear or pale yellow.  Rest as told by your health care provider.  Take over-the-counter and prescription medicines only as told by your health care provider.  Keep a journal to track what has caused your flank pain and what has made it feel better.  Keep all follow-up visits as told by your health care provider. This is important. Contact a health care provider if:  Your pain is not controlled with medicine.  You have new symptoms.  Your pain gets worse.  You have a fever.  Your symptoms last longer than 2-3 days.  You have trouble urinating or you are urinating very frequently. Get help right away if:  You have trouble breathing or you are short of breath.  Your abdomen hurts or it is swollen or red.  You have nausea or vomiting.  You feel faint or you pass out.  You have blood in your urine. Summary  Flank pain is pain that is located on the side of the body between the upper abdomen and the back.  The pain may occur over a short period of time (acute), or it may be long-term or recurring (chronic). It may be mild or severe.  Flank pain can be caused by many things.  Contact your health care provider if your symptoms get worse or they last  longer than 2-3 days. This information is not intended to replace advice given to you by your health care provider. Make sure you discuss any questions you have with your health care provider. Document Released: 05/01/2005 Document Revised: 02/20/2017 Document Reviewed: 05/23/2016 Elsevier Patient Education  2020 [employer].

## 2018-11-02 NOTE — Progress Notes (Signed)
Subjective:  Patient ID: [name], female    DOB: [birth year]  Age: 85 y.o. MRN: [id number]  CC: Annual Exam   HPI [name] presents for a CPX.  She continues to complain of a vague sensation in her right flank that radiates into her right thigh.  She describes it as a discomfort but also says it is a cold sensation.  She only notices this when she is standing on her feet for long periods of time.  She rarely has numbness in her right lower extremity.  She otherwise feels well and offers no other complaints.  She has a history of B12 deficiency but is not currently receiving a B12 supplement.  Outpatient Medications Prior to Visit  Medication Sig Dispense Refill  . Cholecalciferol (VITAMIN D) 50 MCG (2000 UT) tablet TAKE 2 TABLETS (4,000 UNITS TOTAL) BY MOUTH DAILY. (Patient taking differently: 1 Units daily. ) 180 tablet 1  . ibuprofen (ADVIL,MOTRIN) 200 MG tablet Take 200 mg by mouth every 6 (six) hours as needed.    . pravastatin (PRAVACHOL) 40 MG tablet TAKE 1 TABLET BY MOUTH EVERYDAY AT BEDTIME 90 tablet 1  . torsemide (DEMADEX) 10 MG tablet Take 1 tablet (10 mg total) by mouth as needed. 90 tablet 1  . tamoxifen (NOLVADEX) 20 MG tablet TAKE 1 TABLET BY MOUTH EVERY DAY 90 tablet 0   No facility-administered medications prior to visit.     ROS Review of Systems  Constitutional: Negative for appetite change, chills, diaphoresis, fatigue and fever.  HENT: Negative.  Negative for trouble swallowing.   Eyes: Positive for visual disturbance.  Respiratory: Negative for cough, chest tightness, shortness of breath and wheezing.   Cardiovascular: Negative for chest pain, palpitations and leg swelling.  Gastrointestinal: Negative for abdominal pain, blood in stool, constipation, diarrhea, nausea and vomiting.  Endocrine: Negative.   Genitourinary: Positive for flank pain. Negative for decreased urine volume, difficulty urinating, frequency, hematuria and urgency.   Musculoskeletal: Negative for arthralgias.  Skin: Negative.  Negative for rash.  Neurological: Positive for numbness. Negative for dizziness and weakness.  Hematological: Negative for adenopathy. Does not bruise/bleed easily.  Psychiatric/Behavioral: Negative.     Objective:  BP 140/70 (BP Location: Left Arm, Patient Position: Sitting, Cuff Size: Normal)   Pulse 97   Temp 98.7 F (37.1 C) (Oral)   Ht 5\' 2"  (1.575 m)   Wt 171 lb (77.6 kg)   SpO2 97%   BMI 31.28 kg/m   BP Readings from Last 3 Encounters:  11/02/18 140/70  02/22/18 (!) 143/82  10/27/17 (!) 144/74    Wt Readings from Last 3 Encounters:  11/02/18 171 lb (77.6 kg)  02/22/18 177 lb (80.3 kg)  10/27/17 174 lb 12 oz (79.3 kg)    Physical Exam Constitutional:      General: She is not in acute distress.    Appearance: She is obese. She is not ill-appearing, toxic-appearing or diaphoretic.  HENT:     Nose: Nose normal.     Mouth/Throat:     Mouth: Mucous membranes are moist.  Eyes:     General: No scleral icterus.    Conjunctiva/sclera: Conjunctivae normal.  Neck:     Musculoskeletal: Normal range of motion and neck supple. No neck rigidity.  Cardiovascular:     Rate and Rhythm: Normal rate and regular rhythm.     Heart sounds: No murmur.  Pulmonary:     Effort: Pulmonary effort is normal.     Breath sounds: No  wheezing, rhonchi or rales.  Abdominal:     General: Abdomen is flat. Bowel sounds are normal. There is no distension.     Palpations: Abdomen is soft. There is no hepatomegaly or splenomegaly.     Tenderness: There is no abdominal tenderness.  Genitourinary:    Comments: Breast, GU, rectal exams were deferred at her request. Musculoskeletal: Normal range of motion.     Right lower leg: No edema.     Left lower leg: No edema.  Lymphadenopathy:     Cervical: No cervical adenopathy.  Skin:    General: Skin is warm and dry.     Coloration: Skin is not pale.  Neurological:     General: No focal  deficit present.     Mental Status: She is alert and oriented to person, place, and time. Mental status is at baseline.  Psychiatric:        Mood and Affect: Mood normal.        Behavior: Behavior normal.     Lab Results  Component Value Date   WBC 4.7 11/02/2018   HGB 11.4 (L) 11/02/2018   HCT 34.7 (L) 11/02/2018   PLT 223.0 11/02/2018   GLUCOSE 98 11/02/2018   CHOL 189 11/02/2018   TRIG 118.0 11/02/2018   HDL 74.10 11/02/2018   LDLDIRECT 82.0 08/11/2017   LDLCALC 92 11/02/2018   ALT 12 11/02/2018   AST 18 11/02/2018   NA 142 11/02/2018   K 3.6 11/02/2018   CL 106 11/02/2018   CREATININE 0.81 11/02/2018   BUN 12 11/02/2018   CO2 29 11/02/2018   TSH 1.49 11/02/2018   HGBA1C 6.1 11/02/2018    Dg Ankle Complete Right  Result Date: 10/16/2017 CLINICAL DATA:  Pt potentially has a blood clot in her right lower leg. Pt having pain in medial and anterior right ankle with right lower leg swelling. Doppler in room following x-ray. EXAM: RIGHT ANKLE - COMPLETE 3+ VIEW COMPARISON:  None. FINDINGS: No fracture.  No bone lesion. Ankle joint normally spaced and aligned.  No arthropathic changes. There is diffuse soft tissue swelling. IMPRESSION: 1. No skeletal abnormality.  No ankle joint abnormality. 2. Diffuse soft tissue swelling. Electronically Signed   By: Lajean Manes M.D.   On: 10/16/2017 10:32   Vas [place] Lower Extremity Venous (dvt) (only [facility])  Result Date: 10/16/2017  Lower Venous DVT Study Indications: Pain, and Swelling for the past three weeks  Performing Technologist: Toma Copier RVS  Examination Guidelines: A complete evaluation includes B-mode imaging, spectral Doppler, color Doppler, and power Doppler as needed of all accessible portions of each vessel. Bilateral testing is considered an integral part of a complete examination. Limited examinations for reoccurring indications may be performed as noted. The reflux portion of the exam is performed with the patient in  reverse Trendelenburg.  Right Venous Findings: +---------+---------------+---------+-----------+----------+-------+          CompressibilityPhasicitySpontaneityPropertiesSummary +---------+---------------+---------+-----------+----------+-------+ CFV      Full           Yes      Yes                          +---------+---------------+---------+-----------+----------+-------+ SFJ      Full                                                 +---------+---------------+---------+-----------+----------+-------+  FV Prox  Full           Yes      Yes                          +---------+---------------+---------+-----------+----------+-------+ FV Mid   Full                                                 +---------+---------------+---------+-----------+----------+-------+ FV DistalFull           Yes      Yes                          +---------+---------------+---------+-----------+----------+-------+ PFV      Full           Yes      Yes                          +---------+---------------+---------+-----------+----------+-------+ POP      Full           Yes      Yes                          +---------+---------------+---------+-----------+----------+-------+ PTV      Full                                                 +---------+---------------+---------+-----------+----------+-------+ PERO     Full                                                 +---------+---------------+---------+-----------+----------+-------+  Left Venous Findings: +---+---------------+---------+-----------+----------+-------+    CompressibilityPhasicitySpontaneityPropertiesSummary +---+---------------+---------+-----------+----------+-------+ CFVFull           Yes      Yes                          +---+---------------+---------+-----------+----------+-------+ SFJFull                                                  +---+---------------+---------+-----------+----------+-------+    Final Interpretation: Right: There is no evidence of deep vein thrombosis in the lower extremity. No cystic structure found in the popliteal fossa. Left: There is no evidence of a common femoral vein obstruction.  *See table(s) above for measurements and observations. Electronically signed by Deitra Mayo MD on 10/16/2017 at 6:50:26 PM.    Final     Assessment & Plan:   Yarden was seen today for annual exam.  Diagnoses and all orders for this visit:  Essential hypertension- Her BP is adequately well controlled. -     Basic metabolic panel; Future -     TSH; Future  Deficiency anemia- She is persistently anemic.  I will screen her for vitamin deficiencies and will treat the B12 deficiency. -     IBC panel; Future -  CBC with Differential/Platelet; Future -     Vitamin B12; Future -     Ferritin; Future -     Folate; Future -     Vitamin B1; Future -     Reticulocytes; Future  Hyperlipidemia with target LDL less than 130- She has achieved her LDL goal and is doing well on the statin. -     Lipid panel; Future -     TSH; Future -     Hepatic function panel; Future  Prediabetes- Her A1c is at 6.1%.  Medical therapy is not indicated. -     Basic metabolic panel; Future -     Hemoglobin A1c; Future  Routine health maintenance- Exam completed, labs reviewed, vaccines reviewed, mammogram is up-to-date, Pap and colon cancer screening are not indicated, patient education material was given.  Vitamin B12 deficiency anemia due to intrinsic factor deficiency- I have asked her to return for monthly B12 injections. -     CBC with Differential/Platelet; Future -     Vitamin B12; Future -     Folate; Future  Vitamin D deficiency -     VITAMIN D 25 Hydroxy (Vit-D Deficiency, Fractures); Future  Chronic right flank pain- This is a strange symptom.  Her exam is unremarkable, lab work is unremarkable, and plain films do not  reveal any pathology.  She will let me know if she develops any new or worsening symptoms. -     Urinalysis, Routine w reflex microscopic; Future -     DG ABD ACUTE 2+V W 1V CHEST; Future   I have discontinued Verlena L. Tourville's tamoxifen. I am also having her maintain her Vitamin D, ibuprofen, torsemide, and pravastatin.  No orders of the defined types were placed in this encounter.    Follow-up: Return in about 3 months (around 02/02/2019).  Scarlette Calico, MD

## 2018-11-04 ENCOUNTER — Encounter: Payer: Self-pay | Admitting: Internal Medicine

## 2018-11-05 ENCOUNTER — Ambulatory Visit (INDEPENDENT_AMBULATORY_CARE_PROVIDER_SITE_OTHER): Payer: Medicare Other

## 2018-11-05 DIAGNOSIS — E538 Deficiency of other specified B group vitamins: Secondary | ICD-10-CM | POA: Diagnosis not present

## 2018-11-05 MED ORDER — CYANOCOBALAMIN 1000 MCG/ML IJ SOLN
1000.0000 ug | Freq: Once | INTRAMUSCULAR | Status: AC
  Administered 2018-11-05: 09:00:00 1000 ug via INTRAMUSCULAR

## 2018-11-05 NOTE — Progress Notes (Signed)
Medical screening examination/treatment/procedure(s) were performed by non-physician practitioner and as supervising physician I was immediately available for consultation/collaboration. I agree with above. James John, MD   

## 2018-11-09 LAB — VITAMIN B1

## 2018-11-09 LAB — RETICULOCYTES
ABS Retic: 41600 cells/uL (ref 20000–8000)
Retic Ct Pct: 1 %

## 2018-11-25 ENCOUNTER — Other Ambulatory Visit: Payer: Medicare Other

## 2018-12-02 ENCOUNTER — Ambulatory Visit: Payer: Medicare Other

## 2018-12-02 ENCOUNTER — Ambulatory Visit (INDEPENDENT_AMBULATORY_CARE_PROVIDER_SITE_OTHER): Payer: Medicare Other

## 2018-12-02 DIAGNOSIS — E538 Deficiency of other specified B group vitamins: Secondary | ICD-10-CM | POA: Diagnosis not present

## 2018-12-02 MED ORDER — CYANOCOBALAMIN 1000 MCG/ML IJ SOLN
1000.0000 ug | Freq: Once | INTRAMUSCULAR | Status: AC
  Administered 2018-12-02: 11:00:00 1000 ug via INTRAMUSCULAR

## 2018-12-02 NOTE — Progress Notes (Signed)
I have reviewed and agree.

## 2018-12-07 ENCOUNTER — Other Ambulatory Visit: Payer: Self-pay | Admitting: Internal Medicine

## 2018-12-07 DIAGNOSIS — I10 Essential (primary) hypertension: Secondary | ICD-10-CM

## 2018-12-07 DIAGNOSIS — R600 Localized edema: Secondary | ICD-10-CM

## 2018-12-31 ENCOUNTER — Ambulatory Visit (INDEPENDENT_AMBULATORY_CARE_PROVIDER_SITE_OTHER): Payer: Medicare Other

## 2018-12-31 DIAGNOSIS — Z23 Encounter for immunization: Secondary | ICD-10-CM

## 2018-12-31 DIAGNOSIS — E538 Deficiency of other specified B group vitamins: Secondary | ICD-10-CM | POA: Diagnosis not present

## 2018-12-31 MED ORDER — CYANOCOBALAMIN 1000 MCG/ML IJ SOLN
1000.0000 ug | Freq: Once | INTRAMUSCULAR | Status: AC
  Administered 2018-12-31: 14:00:00 1000 ug via INTRAMUSCULAR

## 2019-01-12 ENCOUNTER — Other Ambulatory Visit: Payer: Self-pay | Admitting: Internal Medicine

## 2019-01-12 DIAGNOSIS — E559 Vitamin D deficiency, unspecified: Secondary | ICD-10-CM

## 2019-01-12 NOTE — Telephone Encounter (Signed)
Requested medication (s) are due for refill today: yes  Requested medication (s) are on the active medication list: yes  Last refill:  01/2018  Future visit scheduled: no  Notes to clinic:  Refill cannot be delegated    Requested Prescriptions  Pending Prescriptions Disp Refills   Cholecalciferol (VITAMIN D) 50 MCG (2000 UT) tablet 180 tablet 1    Sig: TAKE 2 TABLETS (4,000 UNITS TOTAL) BY MOUTH DAILY.     Endocrinology:  Vitamins - Vitamin D Supplementation Failed - 01/12/2019  3:01 PM      Failed - 50,000 IU strengths are not delegated      Failed - Phosphate in normal range and within 360 days    No results found for: PHOS       Failed - Vitamin D in normal range and within 360 days    VITD  Date Value Ref Range Status  11/02/2018 33.68 30.00 - 100.00 ng/mL Final         Passed - Ca in normal range and within 360 days    Calcium  Date Value Ref Range Status  11/02/2018 9.6 8.4 - 10.5 mg/dL Final  05/09/2015 10.0 8.4 - 10.4 mg/dL Final         Passed - Valid encounter within last 12 months    Recent Outpatient Visits          2 months ago Essential hypertension   [facility] -Mayer Camel, MD   1 year ago Essential hypertension   [facility] -Mayer Camel, MD   1 year ago Acute gout of right ankle, unspecified cause   [place], Claudina Lick, MD   1 year ago Essential hypertension   [facility] -Mayer Camel, MD   2 years ago Essential hypertension   [facility] -Mayer Camel, MD

## 2019-01-12 NOTE — Telephone Encounter (Signed)
Medication: Cholecalciferol (VITAMIN D) 50 MCG (2000 UT) tablet [id number]    Pharmacy:  [id number] [address], [place] [phone] (Phone) [phone] (Fax)    Pt aware of turn around time

## 2019-01-14 MED ORDER — VITAMIN D 50 MCG (2000 UT) PO TABS: ORAL_TABLET | ORAL | 1 refills | Status: DC

## 2019-02-11 ENCOUNTER — Ambulatory Visit (INDEPENDENT_AMBULATORY_CARE_PROVIDER_SITE_OTHER): Payer: Medicare Other

## 2019-02-11 ENCOUNTER — Other Ambulatory Visit: Payer: Self-pay

## 2019-02-11 DIAGNOSIS — E538 Deficiency of other specified B group vitamins: Secondary | ICD-10-CM | POA: Diagnosis not present

## 2019-02-11 MED ORDER — CYANOCOBALAMIN 1000 MCG/ML IJ SOLN
1000.0000 ug | Freq: Once | INTRAMUSCULAR | Status: AC
  Administered 2019-02-11: 1000 ug via INTRAMUSCULAR

## 2019-02-11 NOTE — Progress Notes (Signed)
Medical screening examination/treatment/procedure(s) were performed by non-physician practitioner and as supervising physician I was immediately available for consultation/collaboration. I agree with above. James John, MD   

## 2019-03-11 ENCOUNTER — Ambulatory Visit: Payer: Medicare Other

## 2019-05-15 ENCOUNTER — Ambulatory Visit: Payer: Medicare Other | Attending: Internal Medicine

## 2019-05-15 NOTE — Progress Notes (Signed)
   Covid-19 Vaccination Clinic  Name:  AMRY FINGERHUT    MRN: [id number] DOB: 30-May-1933  05/15/2019  Ms. Thielen was observed post Covid-19 immunization for 15 minutes without incidence. She was provided with Vaccine Information Sheet and instruction to access the V-Safe system.   Ms. Glazier was instructed to call 911 with any severe reactions post vaccine: Marland Kitchen Difficulty breathing  . Swelling of your face and throat  . A fast heartbeat  . A bad rash all over your body  . Dizziness and weakness

## 2019-06-01 NOTE — Progress Notes (Signed)
Subjective:    Patient ID: [name], female    DOB: 1933-04-06, 86 y.o.   MRN: [id number]  HPI The patient is here for an acute visit.   Right leg pain after fall:  2/21 she fell in a parking lot and fell on her left knee.  She denies knee pain, but has pain from the posterior left knee to the ankle.  The ankle has been swelling since the fall.  She has pain with walking, but no pain with rest.  Her lower leg feels stiff.  She denies bruising, but is blind.   She denies N/T in the leg.  She denies fever/chills.        Her right hip hurts and she has a cold sensation down the lateral side of her right upper leg to her knee.  She has some numbness and pain down the leg too, but nothing goes below the knee.  The pain is worse when she is on her feet to long - rest helps.  She denies lower back pain.  This pain started sometime in the past year.       Medications and allergies reviewed with patient and updated if appropriate.  Patient Active Problem List   Diagnosis Date Noted  . Chronic right flank pain 11/02/2018  . Chronic pain in right ear 02/22/2018  . Localized edema 10/27/2017  . Chronic heart failure with preserved ejection fraction ([place]) 10/27/2017  . Deficiency anemia 10/27/2017  . Vitamin B12 deficiency anemia due to intrinsic factor deficiency 10/27/2017  . Gout attack 10/06/2017  . Essential hypertension 07/10/2015  . Prediabetes 07/06/2013  . Murmur, cardiac 07/06/2013  . Malignant neoplasm of lower-inner quadrant of left breast in female, estrogen receptor positive ([place]) 03/31/2013  . Routine health maintenance 04/12/2011  . Vitamin D deficiency 01/25/2009  . Disorder of bone and cartilage 02/07/2008  . GERD 10/01/2007  . Hyperlipidemia with target LDL less than 130 12/14/2006  . BLINDNESS, LEGAL, [place] DEFINITION 12/14/2006    Current Outpatient Medications on File Prior to Visit  Medication Sig Dispense Refill  . Cholecalciferol (VITAMIN D) 50 MCG (2000  UT) tablet TAKE 2 TABLETS (4,000 UNITS TOTAL) BY MOUTH DAILY. 180 tablet 1  . ibuprofen (ADVIL,MOTRIN) 200 MG tablet Take 200 mg by mouth every 6 (six) hours as needed.    . pravastatin (PRAVACHOL) 40 MG tablet TAKE 1 TABLET BY MOUTH EVERYDAY AT BEDTIME 90 tablet 1  . torsemide (DEMADEX) 10 MG tablet TAKE 1 TABLET (10 MG TOTAL) BY MOUTH AS NEEDED. 90 tablet 1   No current facility-administered medications on file prior to visit.    Past Medical History:  Diagnosis Date  . Anemia   . Arthritis   . Blind   . BLINDNESS, [place], [place] DEFINITION 12/14/2006  . Cancer ([place])    breast  . CONSTIPATION 12/14/2006  . Edema   . EXTERNAL HEMORRHOIDS 09/30/2007  . GERD 10/01/2007  . HYPERLIPIDEMIA 12/14/2006  . OOPHORECTOMY, RIGHT, HX OF 12/14/2006  . OSTEOPENIA 02/07/2008  . RECTAL BLEEDING 10/01/2007  . ROTATOR CUFF REPAIR, RIGHT, HX OF 12/14/2006  . VITAMIN D DEFICIENCY 01/25/2009    Past Surgical History:  Procedure Laterality Date  . ABDOMINAL HYSTERECTOMY    . IRRIGATION AND DEBRIDEMENT SEBACEOUS CYST  06/07   [name]  . LE Art Doppler   '98   normal  . Nuc Stress test   07/07   normal study  . PARTIAL MASTECTOMY WITH NEEDLE LOCALIZATION Left 05/11/2013  Procedure: PARTIAL MASTECTOMY WITH NEEDLE LOCALIZATION;  Surgeon: [name], MD;  Location: [facility];  Service: General;  Laterality: Left;  . RIGHT OOPHORECTOMY    . ROTATOR CUFF REPAIR     right  . TUBAL LIGATION      Social History   Socioeconomic History  . Marital status: Married    Spouse name: Not on file  . Number of children: 5  . Years of education: 30  . Highest education level: Not on file  Occupational History  . Not on file  Tobacco Use  . Smoking status: Never Smoker  . Smokeless tobacco: Never Used  Substance and Sexual Activity  . Alcohol use: No  . Drug use: No  . Sexual activity: Not Currently  Other Topics Concern  . Not on file  Social History Narrative   HSG   Married '61   3  sons- '62, '64, '65; 2 daughters- '60, '66: 5 grandchildren   Lives with husband   Support family-close at hand         Social Determinants of Radio broadcast assistant Strain:   . Difficulty of Paying Living Expenses:   Food Insecurity:   . Worried About Charity fundraiser in the Last Year:   . Arboriculturist in the Last Year:   Transportation Needs:   . Film/video editor (Medical):   Marland Kitchen Lack of Transportation (Non-Medical):   Physical Activity:   . Days of Exercise per Week:   . Minutes of Exercise per Session:   Stress:   . Feeling of Stress :   Social Connections:   . Frequency of Communication with Friends and Family:   . Frequency of Social Gatherings with Friends and Family:   . Attends Religious Services:   . Active Member of Clubs or Organizations:   . Attends Archivist Meetings:   Marland Kitchen Marital Status:     Family History  Problem Relation Age of Onset  . Hypertension Father   . Dementia Father   . Cancer Neg Hx        Negative for breast or colon cancer  . Heart disease Neg Hx     Review of Systems Per HPI    Objective:   Vitals:   06/02/19 0940  BP: 140/78  Pulse: 85  Resp: 16  Temp: 99.2 F (37.3 C)  SpO2: 99%   BP Readings from Last 3 Encounters:  06/02/19 140/78  11/02/18 140/70  02/22/18 (!) 143/82   Wt Readings from Last 3 Encounters:  06/02/19 161 lb (73 kg)  11/02/18 171 lb (77.6 kg)  02/22/18 177 lb (80.3 kg)   Body mass index is 29.45 kg/m.   Physical Exam Constitutional:      General: She is not in acute distress.    Appearance: Normal appearance. She is not ill-appearing.  HENT:     Head: Normocephalic and atraumatic.  Musculoskeletal:     Comments: No tenderness lumbar spine or lower back.  No tenderness right lateral hip.  Tenderness in left calf and posterior knee.  No pain anterior left knee.  Minimal LLE edema  Skin:    General: Skin is warm and dry.     Findings: No bruising or erythema.    Neurological:     Mental Status: She is alert.            Assessment & Plan:    See Problem List for Assessment and Plan of  chronic medical problems.    This visit occurred during the SARS-CoV-2 public health emergency.  Safety protocols were in place, including screening questions prior to the visit, additional usage of staff PPE, and extensive cleaning of exam room while observing appropriate contact time as indicated for disinfecting solutions.

## 2019-06-02 ENCOUNTER — Ambulatory Visit (HOSPITAL_COMMUNITY)
Admission: RE | Admit: 2019-06-02 | Discharge: 2019-06-02 | Disposition: A | Discharge status: Home / Self Care | Payer: Medicare Other | Source: Ambulatory Visit | Attending: Cardiology | Admitting: Cardiology

## 2019-06-02 ENCOUNTER — Encounter: Payer: Self-pay | Admitting: Internal Medicine

## 2019-06-02 ENCOUNTER — Ambulatory Visit (INDEPENDENT_AMBULATORY_CARE_PROVIDER_SITE_OTHER): Payer: Medicare Other | Admitting: Internal Medicine

## 2019-06-02 ENCOUNTER — Other Ambulatory Visit: Payer: Self-pay

## 2019-06-02 VITALS — BP 140/78 | HR 85 | Temp 99.2°F | Resp 16 | Ht 62.0 in | Wt 161.0 lb

## 2019-06-02 DIAGNOSIS — M79662 Pain in left lower leg: Secondary | ICD-10-CM

## 2019-06-02 DIAGNOSIS — M25551 Pain in right hip: Secondary | ICD-10-CM | POA: Diagnosis not present

## 2019-06-02 NOTE — Patient Instructions (Signed)
  A left leg ultrasound was ordered to rule out a blood clot.    Set up an appointment with sports medicine downstairs for right hip/leg pain and left lower leg pain.

## 2019-06-02 NOTE — Assessment & Plan Note (Signed)
After fall onto left knee 2/21 Will get an [place] today to r/o DVT Referred to sports medicine for further evaluation / treatment

## 2019-06-02 NOTE — Assessment & Plan Note (Signed)
Right hip pain with pain,numbness, cold sensation down lateral aspect of right upper leg and into groin No lower back or lateral hip pain Possible lumbar radiculopathy Will refer to sports medicine for further evaluation or treatment

## 2019-06-06 ENCOUNTER — Ambulatory Visit: Payer: Self-pay

## 2019-06-06 ENCOUNTER — Other Ambulatory Visit: Payer: Self-pay

## 2019-06-06 ENCOUNTER — Encounter: Payer: Self-pay | Admitting: Family Medicine

## 2019-06-06 ENCOUNTER — Ambulatory Visit: Payer: Medicare Other | Admitting: Family Medicine

## 2019-06-06 ENCOUNTER — Ambulatory Visit (INDEPENDENT_AMBULATORY_CARE_PROVIDER_SITE_OTHER): Payer: Medicare Other

## 2019-06-06 VITALS — BP 130/78 | HR 79 | Ht 62.0 in | Wt 164.0 lb

## 2019-06-06 DIAGNOSIS — G5711 Meralgia paresthetica, right lower limb: Secondary | ICD-10-CM | POA: Diagnosis not present

## 2019-06-06 DIAGNOSIS — S8992XA Unspecified injury of left lower leg, initial encounter: Secondary | ICD-10-CM | POA: Diagnosis not present

## 2019-06-06 DIAGNOSIS — M25552 Pain in left hip: Secondary | ICD-10-CM | POA: Diagnosis not present

## 2019-06-06 DIAGNOSIS — M79662 Pain in left lower leg: Secondary | ICD-10-CM | POA: Diagnosis not present

## 2019-06-06 DIAGNOSIS — D510 Vitamin B12 deficiency anemia due to intrinsic factor deficiency: Secondary | ICD-10-CM | POA: Diagnosis not present

## 2019-06-06 DIAGNOSIS — M25551 Pain in right hip: Secondary | ICD-10-CM | POA: Diagnosis not present

## 2019-06-06 MED ORDER — GABAPENTIN 300 MG PO CAPS: 300.0000 mg | ORAL_CAPSULE | Freq: Three times a day (TID) | ORAL | 1 refills | Status: DC | PRN

## 2019-06-06 NOTE — Progress Notes (Signed)
X-ray hip normal-appearing

## 2019-06-06 NOTE — Progress Notes (Signed)
Subjective:    CC: R hip/thigh pain and L calf pain  I, [name], LAT, ATC, am serving as scribe for Dr. Lynne Leader.  HPI: Pt is a 86 y/o female presenting R hip/thigh pain and numbness and L calf pain.  R lateral abdomen/lower back x 2 years that radiates into her R lateral hip/thigh: -Radiating pain: Yes into the R lateral hip  And thigh -Numbness/tingling: Yes -Low back pain: No -R LE weakness: No -Aggravating factors: Prolonged standing and walking -Treatments tried: Advil  L calf pain: Pt reports having fallen on 05/15/19, landing on her L knee.  She denies any L knee pain but has been having L calf pain and L ankle swelling since.  She saw Dr. Quay Burow on 06/02/19 and a LE venous doppler study was ordered that was negative for DVT.  No x-ray obtained yet.  Pertinent review of Systems: No fevers or chills.  Relevant historical information: Blind.  Hypertension, heart failure, history of breast cancer History B12 deficiency.  Patient notes that she has been not able to get her B12 shots in about 3 months.  Objective:    Vitals:   06/06/19 0946  BP: 130/78  Pulse: 79  SpO2: 98%   General: Well Developed, well nourished, and in no acute distress.   MSK: Left lower leg: Swollen from knee to calf and ankle. Left knee: Mild effusion.  Not particularly tender palpation.  Normal motion. Left calf: Moderate edema mildly tender to palpation.  No palpable cords. Left ankle: Again moderate effusion versus edema. Mildly tender palpation both medial and lateral malleoli. Range of motion: Normal  Right hip: Normal-appearing tender palpation mildly at lateral hip at ileum. Not particular tender at greater trochanter. Normal hip motion. Unable to reproduce numbness or tingling at anterior hip  Lab and Radiology Results:  X-ray images right hip and left tib-fib obtained today personally and independently reviewed  Right hip: Mild degenerative changes no acute fractures  or severe changes otherwise.  Left tib-fib: Mild to moderate knee DJD.  Mild ankle DJD.  No acute fractures or significant abnormalities.  Soft tissue swelling in calf.  Await formal radiology review   No results found for this or any previous visit (from the past 72 hour(s)). VAS [place] LOWER EXTREMITY VENOUS (DVT)  Result Date: 06/02/2019  Lower Venous DVTStudy Other Indications: Patient complains of left calf pain for one week. She denies                    any shortness of breath. Risk Factors: None identified. Performing Technologist: Wilkie Aye RVT  Examination Guidelines: A complete evaluation includes B-mode imaging, spectral Doppler, color Doppler, and power Doppler as needed of all accessible portions of each vessel. Bilateral testing is considered an integral part of a complete examination. Limited examinations for reoccurring indications may be performed as noted. The reflux portion of the exam is performed with the patient in reverse Trendelenburg.  +-----+---------------+---------+-----------+----------+--------------+ RIGHTCompressibilityPhasicitySpontaneityPropertiesThrombus Aging +-----+---------------+---------+-----------+----------+--------------+ CFV  Full           Yes      Yes                                 +-----+---------------+---------+-----------+----------+--------------+   +---------+---------------+---------+-----------+----------+--------------+ LEFT     CompressibilityPhasicitySpontaneityPropertiesThrombus Aging +---------+---------------+---------+-----------+----------+--------------+ CFV      Full           Yes  Yes                                 +---------+---------------+---------+-----------+----------+--------------+ SFJ      Full           Yes      Yes                                 +---------+---------------+---------+-----------+----------+--------------+ FV Prox  Full           Yes      Yes                                  +---------+---------------+---------+-----------+----------+--------------+ FV Mid   Full           Yes      Yes                                 +---------+---------------+---------+-----------+----------+--------------+ FV DistalFull           Yes      Yes                                 +---------+---------------+---------+-----------+----------+--------------+ PFV      Full           Yes      Yes                                 +---------+---------------+---------+-----------+----------+--------------+ POP      Full           Yes      Yes                                 +---------+---------------+---------+-----------+----------+--------------+ PTV      Full           Yes      Yes                                 +---------+---------------+---------+-----------+----------+--------------+ PERO     Full           Yes      Yes                                 +---------+---------------+---------+-----------+----------+--------------+ Soleal   Full           Yes      Yes                                 +---------+---------------+---------+-----------+----------+--------------+ Gastroc  Full           Yes      Yes                                 +---------+---------------+---------+-----------+----------+--------------+ GSV      Full  Yes      Yes                                 +---------+---------------+---------+-----------+----------+--------------+     Summary: RIGHT: - No evidence of common femoral vein obstruction.  LEFT: - No evidence of deep vein thrombosis in the lower extremity. No indirect evidence of obstruction proximal to the inguinal ligament. - No cystic structure found in the popliteal fossa.  *See table(s) above for measurements and observations. Electronically signed by Ena Dawley MD on 06/02/2019 at 5:33:33 PM.    Final       Impression and Recommendations:    Assessment and Plan: 86 y.o. female with  .  Right  lateral thigh numbness and tingling.  Likely meralgia paresthetica.  Patient has been changing her gait a bit because of her left leg pain and swelling.  Additionally she has been missing her B12 injections which may be contributing to this problem.  Plan to restart B12 shots and trial gabapentin.  Check back in 1 month.  Would consider lateral femoral cutaneous nerve hydrodissection if not better.  Left lower leg swelling and pain after fall.  No acute fractures x-ray today.  Plan for trial of Voltaren gel and compressive stockings as tolerated.  Recheck back in 1 month if not better would consider injections.  PDMP not reviewed this encounter. Orders Placed This Encounter  Procedures  . DG HIP UNILAT WITH PELVIS 2-3 VIEWS RIGHT    Standing Status:   Future    Number of Occurrences:   1    Standing Expiration Date:   08/05/2020    Order Specific Question:   Reason for Exam (SYMPTOM  OR DIAGNOSIS REQUIRED)    Answer:   eval right hip and thigh numbness and pain    Order Specific Question:   Preferred imaging location?    Answer:   [facility]    Order Specific Question:   Radiology Contrast Protocol - do NOT remove file path    Answer:   \\[email or web address]s.[email or web address]  . DG Tibia/Fibula Left    Standing Status:   Future    Number of Occurrences:   1    Standing Expiration Date:   08/05/2020    Order Specific Question:   Reason for Exam (SYMPTOM  OR DIAGNOSIS REQUIRED)    Answer:   eval left lower leg swelling after fall    Order Specific Question:   Preferred imaging location?    Answer:   [facility]    Order Specific Question:   Radiology Contrast Protocol - do NOT remove file path    Answer:   \\[email or web address]s.[email or web address]   Meds ordered this encounter  Medications  . gabapentin (NEURONTIN) 300 MG capsule    Sig: Take 1 capsule (300 mg total) by mouth 3 (three) times daily as needed (Nerve pain). Take mostly at  night    Dispense:  180 capsule    Refill:  1    Discussed warning signs or symptoms. Please see discharge instructions. Patient expresses understanding.   The above documentation has been reviewed and is accurate and complete Lynne Leader

## 2019-06-06 NOTE — Progress Notes (Signed)
X-ray tibia and fibula left no acute changes.  No fractures.

## 2019-06-06 NOTE — Patient Instructions (Addendum)
Thank you for coming in today. Use voltaren gel on th leg up to 4x daily for pain.  Take gabapentin for nerve pain up to 3x daily but mostly at night.  Establish B12 injections with Dr Ronnald Ramp. Recheck with me in about 1 month.  If no better I can do injections.

## 2019-06-08 ENCOUNTER — Ambulatory Visit: Payer: Medicare Other | Attending: Internal Medicine

## 2019-06-08 DIAGNOSIS — Z23 Encounter for immunization: Secondary | ICD-10-CM

## 2019-06-08 NOTE — Progress Notes (Signed)
   Covid-19 Vaccination Clinic  Name:  ALYSAN HERRELL    MRN: [id number] DOB: 1934-01-28  06/08/2019  Ms. Wittler was observed post Covid-19 immunization for 15 minutes without incident. She was provided with Vaccine Information Sheet and instruction to access the V-Safe system.   Ms. Cheuvront was instructed to call 911 with any severe reactions post vaccine: Marland Kitchen Difficulty breathing  . Swelling of face and throat  . A fast heartbeat  . A bad rash all over body  . Dizziness and weakness   Immunizations Administered    Name Date Dose VIS Date Route   Pfizer COVID-19 Vaccine 06/08/2019  1:15 PM 0.3 mL 03/04/2019 Intramuscular   Manufacturer: Junior   Lot: [id number]   [place]: [id number]

## 2019-06-22 ENCOUNTER — Other Ambulatory Visit: Payer: Self-pay | Admitting: Internal Medicine

## 2019-06-22 DIAGNOSIS — I10 Essential (primary) hypertension: Secondary | ICD-10-CM

## 2019-06-22 DIAGNOSIS — R600 Localized edema: Secondary | ICD-10-CM

## 2019-07-15 ENCOUNTER — Encounter: Payer: Self-pay | Admitting: Family Medicine

## 2019-07-15 ENCOUNTER — Ambulatory Visit (INDEPENDENT_AMBULATORY_CARE_PROVIDER_SITE_OTHER): Payer: Medicare Other | Admitting: Family Medicine

## 2019-07-15 ENCOUNTER — Other Ambulatory Visit: Payer: Self-pay

## 2019-07-15 VITALS — BP 130/72 | HR 84 | Ht 62.0 in | Wt 159.2 lb

## 2019-07-15 DIAGNOSIS — M25551 Pain in right hip: Secondary | ICD-10-CM | POA: Diagnosis not present

## 2019-07-15 DIAGNOSIS — G5711 Meralgia paresthetica, right lower limb: Secondary | ICD-10-CM

## 2019-07-15 NOTE — Patient Instructions (Signed)
Thank you for coming in today. Plan for home health PT.  Recheck in 6 weeks unless better.  We can do a shot for both the hip/pelvis pain and the thigh numbness if they are bad enough to make the shot make sense.

## 2019-07-15 NOTE — Progress Notes (Signed)
   I, Wendy Poet, LAT, ATC, am serving as scribe for Dr. Lynne Leader.  [name] is a 86 y.o. female who presents to [place] at [facility] today for f/u of R hip/thigh pain and L calf pain.  She was last seen by Dr. Georgina Snell on 06/06/19 and was prescribed Gabapentin and advised to use compression stockings/socks for her L calf pain.  She had a LE venous doppler on 06/02/19 that was negative for DVT and negative for a Baker's cyst.  Since her last visit, pt reports that her L calf is better.  She states that her R hip and thigh is about the same and that she con't to have pain from her R low back/iliac crest area that radiates into her R lateral thigh.  She is also c/o constant numbness in her R thigh.   Pertinent review of systems: No fevers or chills  Relevant historical information: Hypertension, heart failure, vitamin B12 deficiency.  History rest cancer   Exam:  BP 130/72 (BP Location: Right Arm, Patient Position: Sitting, Cuff Size: Normal)   Pulse 84   Ht 5\' 2"  (1.575 m)   Wt 159 lb 3.2 oz (72.2 kg)   SpO2 97%   BMI 29.12 kg/m  General: Well Developed, well nourished, and in no acute distress.   MSK:  Right hip normal-appearing Tender palpation mildly at right lateral iliac crest. Normal gait.  Right thigh: Normal-appearing decreased sensation to light touch lateral thigh    Lab and Radiology Results  X-ray images hip from June 06, 2019 reviewed.    Assessment and Plan: 86 y.o. female with  Right lateral hip/iliac crest pain.  Likely tendinopathy at hip abductor origin.  Discussed options.  Plan for home health physical therapy.  Patient is visually impaired in caring for her husband who has dementia.  Home health necessary for this.  Right lateral thigh numbness: Meralgia paresthetica.  Discussed options.  She notes is not very bothersome.  I asked if it is bad enough to proceed with injection today and she said no.  Next step for this if  needed would be trial of lateral femoral cutaneous nerve hydrodissection.  However watchful waiting for now.   PDMP not reviewed this encounter. Orders Placed This Encounter  Procedures  . Ambulatory referral to Home Health    Referral Priority:   Routine    Referral Type:   Home Health Care    Referral Reason:   Specialty Services Required    Requested Specialty:   [place]    Number of Visits Requested:   1   No orders of the defined types were placed in this encounter.    Discussed warning signs or symptoms. Please see discharge instructions. Patient expresses understanding.   The above documentation has been reviewed and is accurate and complete Lynne Leader

## 2019-07-22 DIAGNOSIS — I5032 Chronic diastolic (congestive) heart failure: Secondary | ICD-10-CM | POA: Diagnosis not present

## 2019-07-22 DIAGNOSIS — I110 Hypertensive heart disease with heart failure: Secondary | ICD-10-CM | POA: Diagnosis not present

## 2019-07-22 DIAGNOSIS — Z1231 Encounter for screening mammogram for malignant neoplasm of breast: Secondary | ICD-10-CM | POA: Diagnosis not present

## 2019-07-22 DIAGNOSIS — M7601 Gluteal tendinitis, right hip: Secondary | ICD-10-CM | POA: Diagnosis not present

## 2019-07-22 DIAGNOSIS — H548 Legal blindness, as defined in USA: Secondary | ICD-10-CM | POA: Diagnosis not present

## 2019-07-22 DIAGNOSIS — G5711 Meralgia paresthetica, right lower limb: Secondary | ICD-10-CM | POA: Diagnosis not present

## 2019-07-22 DIAGNOSIS — E559 Vitamin D deficiency, unspecified: Secondary | ICD-10-CM | POA: Diagnosis not present

## 2019-07-22 DIAGNOSIS — K219 Gastro-esophageal reflux disease without esophagitis: Secondary | ICD-10-CM | POA: Diagnosis not present

## 2019-07-22 DIAGNOSIS — C50312 Malignant neoplasm of lower-inner quadrant of left female breast: Secondary | ICD-10-CM | POA: Diagnosis not present

## 2019-07-22 DIAGNOSIS — D510 Vitamin B12 deficiency anemia due to intrinsic factor deficiency: Secondary | ICD-10-CM | POA: Diagnosis not present

## 2019-07-22 DIAGNOSIS — E785 Hyperlipidemia, unspecified: Secondary | ICD-10-CM | POA: Diagnosis not present

## 2019-07-22 DIAGNOSIS — M79662 Pain in left lower leg: Secondary | ICD-10-CM | POA: Diagnosis not present

## 2019-07-26 DIAGNOSIS — E559 Vitamin D deficiency, unspecified: Secondary | ICD-10-CM | POA: Diagnosis not present

## 2019-07-26 DIAGNOSIS — C50312 Malignant neoplasm of lower-inner quadrant of left female breast: Secondary | ICD-10-CM | POA: Diagnosis not present

## 2019-07-26 DIAGNOSIS — H548 Legal blindness, as defined in USA: Secondary | ICD-10-CM | POA: Diagnosis not present

## 2019-07-26 DIAGNOSIS — D510 Vitamin B12 deficiency anemia due to intrinsic factor deficiency: Secondary | ICD-10-CM | POA: Diagnosis not present

## 2019-07-26 DIAGNOSIS — G5711 Meralgia paresthetica, right lower limb: Secondary | ICD-10-CM | POA: Diagnosis not present

## 2019-07-26 DIAGNOSIS — M7601 Gluteal tendinitis, right hip: Secondary | ICD-10-CM | POA: Diagnosis not present

## 2019-07-26 DIAGNOSIS — M79662 Pain in left lower leg: Secondary | ICD-10-CM | POA: Diagnosis not present

## 2019-07-26 DIAGNOSIS — E785 Hyperlipidemia, unspecified: Secondary | ICD-10-CM | POA: Diagnosis not present

## 2019-07-26 DIAGNOSIS — I110 Hypertensive heart disease with heart failure: Secondary | ICD-10-CM | POA: Diagnosis not present

## 2019-07-26 DIAGNOSIS — I5032 Chronic diastolic (congestive) heart failure: Secondary | ICD-10-CM | POA: Diagnosis not present

## 2019-07-26 DIAGNOSIS — K219 Gastro-esophageal reflux disease without esophagitis: Secondary | ICD-10-CM | POA: Diagnosis not present

## 2019-07-28 DIAGNOSIS — C50312 Malignant neoplasm of lower-inner quadrant of left female breast: Secondary | ICD-10-CM | POA: Diagnosis not present

## 2019-07-28 DIAGNOSIS — M7601 Gluteal tendinitis, right hip: Secondary | ICD-10-CM | POA: Diagnosis not present

## 2019-07-28 DIAGNOSIS — M79662 Pain in left lower leg: Secondary | ICD-10-CM | POA: Diagnosis not present

## 2019-07-28 DIAGNOSIS — K219 Gastro-esophageal reflux disease without esophagitis: Secondary | ICD-10-CM | POA: Diagnosis not present

## 2019-07-28 DIAGNOSIS — H548 Legal blindness, as defined in USA: Secondary | ICD-10-CM | POA: Diagnosis not present

## 2019-07-28 DIAGNOSIS — E785 Hyperlipidemia, unspecified: Secondary | ICD-10-CM | POA: Diagnosis not present

## 2019-07-28 DIAGNOSIS — D510 Vitamin B12 deficiency anemia due to intrinsic factor deficiency: Secondary | ICD-10-CM | POA: Diagnosis not present

## 2019-07-28 DIAGNOSIS — E559 Vitamin D deficiency, unspecified: Secondary | ICD-10-CM | POA: Diagnosis not present

## 2019-07-28 DIAGNOSIS — I110 Hypertensive heart disease with heart failure: Secondary | ICD-10-CM | POA: Diagnosis not present

## 2019-07-28 DIAGNOSIS — I5032 Chronic diastolic (congestive) heart failure: Secondary | ICD-10-CM | POA: Diagnosis not present

## 2019-07-28 DIAGNOSIS — G5711 Meralgia paresthetica, right lower limb: Secondary | ICD-10-CM | POA: Diagnosis not present

## 2019-08-02 DIAGNOSIS — H548 Legal blindness, as defined in USA: Secondary | ICD-10-CM | POA: Diagnosis not present

## 2019-08-02 DIAGNOSIS — E785 Hyperlipidemia, unspecified: Secondary | ICD-10-CM | POA: Diagnosis not present

## 2019-08-02 DIAGNOSIS — I110 Hypertensive heart disease with heart failure: Secondary | ICD-10-CM | POA: Diagnosis not present

## 2019-08-02 DIAGNOSIS — M7601 Gluteal tendinitis, right hip: Secondary | ICD-10-CM | POA: Diagnosis not present

## 2019-08-02 DIAGNOSIS — K219 Gastro-esophageal reflux disease without esophagitis: Secondary | ICD-10-CM | POA: Diagnosis not present

## 2019-08-02 DIAGNOSIS — I5032 Chronic diastolic (congestive) heart failure: Secondary | ICD-10-CM | POA: Diagnosis not present

## 2019-08-02 DIAGNOSIS — M79662 Pain in left lower leg: Secondary | ICD-10-CM | POA: Diagnosis not present

## 2019-08-02 DIAGNOSIS — E559 Vitamin D deficiency, unspecified: Secondary | ICD-10-CM | POA: Diagnosis not present

## 2019-08-02 DIAGNOSIS — D510 Vitamin B12 deficiency anemia due to intrinsic factor deficiency: Secondary | ICD-10-CM | POA: Diagnosis not present

## 2019-08-02 DIAGNOSIS — C50312 Malignant neoplasm of lower-inner quadrant of left female breast: Secondary | ICD-10-CM | POA: Diagnosis not present

## 2019-08-02 DIAGNOSIS — G5711 Meralgia paresthetica, right lower limb: Secondary | ICD-10-CM | POA: Diagnosis not present

## 2019-08-04 DIAGNOSIS — E785 Hyperlipidemia, unspecified: Secondary | ICD-10-CM | POA: Diagnosis not present

## 2019-08-04 DIAGNOSIS — H548 Legal blindness, as defined in USA: Secondary | ICD-10-CM | POA: Diagnosis not present

## 2019-08-04 DIAGNOSIS — I5032 Chronic diastolic (congestive) heart failure: Secondary | ICD-10-CM | POA: Diagnosis not present

## 2019-08-04 DIAGNOSIS — G5711 Meralgia paresthetica, right lower limb: Secondary | ICD-10-CM | POA: Diagnosis not present

## 2019-08-04 DIAGNOSIS — I110 Hypertensive heart disease with heart failure: Secondary | ICD-10-CM | POA: Diagnosis not present

## 2019-08-04 DIAGNOSIS — M79662 Pain in left lower leg: Secondary | ICD-10-CM | POA: Diagnosis not present

## 2019-08-04 DIAGNOSIS — E559 Vitamin D deficiency, unspecified: Secondary | ICD-10-CM | POA: Diagnosis not present

## 2019-08-04 DIAGNOSIS — K219 Gastro-esophageal reflux disease without esophagitis: Secondary | ICD-10-CM | POA: Diagnosis not present

## 2019-08-04 DIAGNOSIS — C50312 Malignant neoplasm of lower-inner quadrant of left female breast: Secondary | ICD-10-CM | POA: Diagnosis not present

## 2019-08-04 DIAGNOSIS — M7601 Gluteal tendinitis, right hip: Secondary | ICD-10-CM | POA: Diagnosis not present

## 2019-08-04 DIAGNOSIS — D510 Vitamin B12 deficiency anemia due to intrinsic factor deficiency: Secondary | ICD-10-CM | POA: Diagnosis not present

## 2019-08-09 DIAGNOSIS — I110 Hypertensive heart disease with heart failure: Secondary | ICD-10-CM | POA: Diagnosis not present

## 2019-08-09 DIAGNOSIS — E559 Vitamin D deficiency, unspecified: Secondary | ICD-10-CM | POA: Diagnosis not present

## 2019-08-09 DIAGNOSIS — K219 Gastro-esophageal reflux disease without esophagitis: Secondary | ICD-10-CM | POA: Diagnosis not present

## 2019-08-09 DIAGNOSIS — I5032 Chronic diastolic (congestive) heart failure: Secondary | ICD-10-CM | POA: Diagnosis not present

## 2019-08-09 DIAGNOSIS — C50312 Malignant neoplasm of lower-inner quadrant of left female breast: Secondary | ICD-10-CM | POA: Diagnosis not present

## 2019-08-09 DIAGNOSIS — E785 Hyperlipidemia, unspecified: Secondary | ICD-10-CM | POA: Diagnosis not present

## 2019-08-09 DIAGNOSIS — D510 Vitamin B12 deficiency anemia due to intrinsic factor deficiency: Secondary | ICD-10-CM | POA: Diagnosis not present

## 2019-08-09 DIAGNOSIS — M7601 Gluteal tendinitis, right hip: Secondary | ICD-10-CM | POA: Diagnosis not present

## 2019-08-09 DIAGNOSIS — G5711 Meralgia paresthetica, right lower limb: Secondary | ICD-10-CM | POA: Diagnosis not present

## 2019-08-09 DIAGNOSIS — M79662 Pain in left lower leg: Secondary | ICD-10-CM | POA: Diagnosis not present

## 2019-08-09 DIAGNOSIS — H548 Legal blindness, as defined in USA: Secondary | ICD-10-CM | POA: Diagnosis not present

## 2019-08-11 DIAGNOSIS — I5032 Chronic diastolic (congestive) heart failure: Secondary | ICD-10-CM | POA: Diagnosis not present

## 2019-08-11 DIAGNOSIS — G5711 Meralgia paresthetica, right lower limb: Secondary | ICD-10-CM | POA: Diagnosis not present

## 2019-08-11 DIAGNOSIS — M7601 Gluteal tendinitis, right hip: Secondary | ICD-10-CM | POA: Diagnosis not present

## 2019-08-11 DIAGNOSIS — K219 Gastro-esophageal reflux disease without esophagitis: Secondary | ICD-10-CM | POA: Diagnosis not present

## 2019-08-11 DIAGNOSIS — I110 Hypertensive heart disease with heart failure: Secondary | ICD-10-CM | POA: Diagnosis not present

## 2019-08-11 DIAGNOSIS — E785 Hyperlipidemia, unspecified: Secondary | ICD-10-CM | POA: Diagnosis not present

## 2019-08-11 DIAGNOSIS — E559 Vitamin D deficiency, unspecified: Secondary | ICD-10-CM | POA: Diagnosis not present

## 2019-08-11 DIAGNOSIS — H548 Legal blindness, as defined in USA: Secondary | ICD-10-CM | POA: Diagnosis not present

## 2019-08-11 DIAGNOSIS — M79662 Pain in left lower leg: Secondary | ICD-10-CM | POA: Diagnosis not present

## 2019-08-11 DIAGNOSIS — D510 Vitamin B12 deficiency anemia due to intrinsic factor deficiency: Secondary | ICD-10-CM | POA: Diagnosis not present

## 2019-08-11 DIAGNOSIS — C50312 Malignant neoplasm of lower-inner quadrant of left female breast: Secondary | ICD-10-CM | POA: Diagnosis not present

## 2019-08-16 DIAGNOSIS — C50312 Malignant neoplasm of lower-inner quadrant of left female breast: Secondary | ICD-10-CM | POA: Diagnosis not present

## 2019-08-16 DIAGNOSIS — E559 Vitamin D deficiency, unspecified: Secondary | ICD-10-CM | POA: Diagnosis not present

## 2019-08-16 DIAGNOSIS — I5032 Chronic diastolic (congestive) heart failure: Secondary | ICD-10-CM | POA: Diagnosis not present

## 2019-08-16 DIAGNOSIS — D510 Vitamin B12 deficiency anemia due to intrinsic factor deficiency: Secondary | ICD-10-CM | POA: Diagnosis not present

## 2019-08-16 DIAGNOSIS — K219 Gastro-esophageal reflux disease without esophagitis: Secondary | ICD-10-CM | POA: Diagnosis not present

## 2019-08-16 DIAGNOSIS — E785 Hyperlipidemia, unspecified: Secondary | ICD-10-CM | POA: Diagnosis not present

## 2019-08-16 DIAGNOSIS — G5711 Meralgia paresthetica, right lower limb: Secondary | ICD-10-CM | POA: Diagnosis not present

## 2019-08-16 DIAGNOSIS — M7601 Gluteal tendinitis, right hip: Secondary | ICD-10-CM | POA: Diagnosis not present

## 2019-08-16 DIAGNOSIS — M79662 Pain in left lower leg: Secondary | ICD-10-CM | POA: Diagnosis not present

## 2019-08-16 DIAGNOSIS — H548 Legal blindness, as defined in USA: Secondary | ICD-10-CM | POA: Diagnosis not present

## 2019-08-16 DIAGNOSIS — I110 Hypertensive heart disease with heart failure: Secondary | ICD-10-CM | POA: Diagnosis not present

## 2019-08-18 DIAGNOSIS — E559 Vitamin D deficiency, unspecified: Secondary | ICD-10-CM | POA: Diagnosis not present

## 2019-08-18 DIAGNOSIS — K219 Gastro-esophageal reflux disease without esophagitis: Secondary | ICD-10-CM | POA: Diagnosis not present

## 2019-08-18 DIAGNOSIS — G5711 Meralgia paresthetica, right lower limb: Secondary | ICD-10-CM | POA: Diagnosis not present

## 2019-08-18 DIAGNOSIS — C50312 Malignant neoplasm of lower-inner quadrant of left female breast: Secondary | ICD-10-CM | POA: Diagnosis not present

## 2019-08-18 DIAGNOSIS — H548 Legal blindness, as defined in USA: Secondary | ICD-10-CM | POA: Diagnosis not present

## 2019-08-18 DIAGNOSIS — I110 Hypertensive heart disease with heart failure: Secondary | ICD-10-CM | POA: Diagnosis not present

## 2019-08-18 DIAGNOSIS — M79662 Pain in left lower leg: Secondary | ICD-10-CM | POA: Diagnosis not present

## 2019-08-18 DIAGNOSIS — E785 Hyperlipidemia, unspecified: Secondary | ICD-10-CM | POA: Diagnosis not present

## 2019-08-18 DIAGNOSIS — I5032 Chronic diastolic (congestive) heart failure: Secondary | ICD-10-CM | POA: Diagnosis not present

## 2019-08-18 DIAGNOSIS — M7601 Gluteal tendinitis, right hip: Secondary | ICD-10-CM | POA: Diagnosis not present

## 2019-08-18 DIAGNOSIS — D510 Vitamin B12 deficiency anemia due to intrinsic factor deficiency: Secondary | ICD-10-CM | POA: Diagnosis not present

## 2019-08-23 DIAGNOSIS — M7601 Gluteal tendinitis, right hip: Secondary | ICD-10-CM | POA: Diagnosis not present

## 2019-08-23 DIAGNOSIS — I110 Hypertensive heart disease with heart failure: Secondary | ICD-10-CM | POA: Diagnosis not present

## 2019-08-23 DIAGNOSIS — E785 Hyperlipidemia, unspecified: Secondary | ICD-10-CM | POA: Diagnosis not present

## 2019-08-23 DIAGNOSIS — K219 Gastro-esophageal reflux disease without esophagitis: Secondary | ICD-10-CM | POA: Diagnosis not present

## 2019-08-23 DIAGNOSIS — D510 Vitamin B12 deficiency anemia due to intrinsic factor deficiency: Secondary | ICD-10-CM | POA: Diagnosis not present

## 2019-08-23 DIAGNOSIS — E559 Vitamin D deficiency, unspecified: Secondary | ICD-10-CM | POA: Diagnosis not present

## 2019-08-23 DIAGNOSIS — C50312 Malignant neoplasm of lower-inner quadrant of left female breast: Secondary | ICD-10-CM | POA: Diagnosis not present

## 2019-08-23 DIAGNOSIS — G5711 Meralgia paresthetica, right lower limb: Secondary | ICD-10-CM | POA: Diagnosis not present

## 2019-08-23 DIAGNOSIS — H548 Legal blindness, as defined in USA: Secondary | ICD-10-CM | POA: Diagnosis not present

## 2019-08-23 DIAGNOSIS — M79662 Pain in left lower leg: Secondary | ICD-10-CM | POA: Diagnosis not present

## 2019-08-23 DIAGNOSIS — I5032 Chronic diastolic (congestive) heart failure: Secondary | ICD-10-CM | POA: Diagnosis not present

## 2019-08-25 DIAGNOSIS — H548 Legal blindness, as defined in USA: Secondary | ICD-10-CM | POA: Diagnosis not present

## 2019-08-25 DIAGNOSIS — M7601 Gluteal tendinitis, right hip: Secondary | ICD-10-CM | POA: Diagnosis not present

## 2019-08-25 DIAGNOSIS — C50312 Malignant neoplasm of lower-inner quadrant of left female breast: Secondary | ICD-10-CM | POA: Diagnosis not present

## 2019-08-25 DIAGNOSIS — G5711 Meralgia paresthetica, right lower limb: Secondary | ICD-10-CM | POA: Diagnosis not present

## 2019-08-25 DIAGNOSIS — I110 Hypertensive heart disease with heart failure: Secondary | ICD-10-CM | POA: Diagnosis not present

## 2019-08-25 DIAGNOSIS — M79662 Pain in left lower leg: Secondary | ICD-10-CM | POA: Diagnosis not present

## 2019-08-25 DIAGNOSIS — I5032 Chronic diastolic (congestive) heart failure: Secondary | ICD-10-CM | POA: Diagnosis not present

## 2019-08-25 DIAGNOSIS — D510 Vitamin B12 deficiency anemia due to intrinsic factor deficiency: Secondary | ICD-10-CM | POA: Diagnosis not present

## 2019-08-25 DIAGNOSIS — E785 Hyperlipidemia, unspecified: Secondary | ICD-10-CM | POA: Diagnosis not present

## 2019-08-25 DIAGNOSIS — K219 Gastro-esophageal reflux disease without esophagitis: Secondary | ICD-10-CM | POA: Diagnosis not present

## 2019-08-25 DIAGNOSIS — E559 Vitamin D deficiency, unspecified: Secondary | ICD-10-CM | POA: Diagnosis not present

## 2019-08-26 ENCOUNTER — Ambulatory Visit: Payer: Medicare Other | Admitting: Family Medicine

## 2019-08-26 ENCOUNTER — Encounter: Payer: Self-pay | Admitting: Family Medicine

## 2019-08-26 ENCOUNTER — Other Ambulatory Visit: Payer: Self-pay

## 2019-08-26 VITALS — BP 120/74 | HR 77 | Ht 62.0 in | Wt 161.6 lb

## 2019-08-26 DIAGNOSIS — M25551 Pain in right hip: Secondary | ICD-10-CM

## 2019-08-26 DIAGNOSIS — G5711 Meralgia paresthetica, right lower limb: Secondary | ICD-10-CM

## 2019-08-26 NOTE — Patient Instructions (Signed)
Thank you for coming in today.  Plan for continued home exercises.   Let me know it comes back or gets worse or changes.  I can do injection or other treatment if needed in the future.

## 2019-08-26 NOTE — Progress Notes (Signed)
   I, Wendy Poet, LAT, ATC, am serving as scribe for Dr. Lynne Leader.  [name] is a 86 y.o. female who presents to [place] at [facility] today for f/u of R hip and thigh pain and R thigh numbness.  She was last seen by Dr. Georgina Snell on 07/15/19 and was referred to home health PT.  Since her last visit, pt reports that her R hip pain has improved.  She states that her R thigh numbness has improved as well but is still present.  She has worked w/ home health PT since early May and they have been working w/ her 2x/week.  Diagnostic testing: R hip and L tib/fib XR- 06/06/19  Pertinent review of systems: No fevers or chills  Relevant historical information: Blind, heart failure, hypertension, breast cancer history   Exam:  BP 120/74 (BP Location: Right Arm, Patient Position: Sitting, Cuff Size: Normal)   Pulse 77   Ht 5\' 2"  (1.575 m)   Wt 161 lb 9.6 oz (73.3 kg)   SpO2 99%   BMI 29.56 kg/m  General: Well Developed, well nourished, and in no acute distress.   MSK: Right hip normal-appearing nontender normal gait.   Assessment and Plan: 86 y.o. female with right hip pain significant improvement with home health physical therapy.  Plan to continue home exercise program recheck back with me as needed.  Paresthesias lateral thigh likely meralgia paresthetica.  Again improved.  Consider trial of injection in future if needed.    Discussed warning signs or symptoms. Please see discharge instructions. Patient expresses understanding.   The above documentation has been reviewed and is accurate and complete Lynne Leader, M.D.  Total encounter time 20 minutes including charting time date of service. Differential diagnosis treatment plan and options.

## 2019-09-01 DIAGNOSIS — E559 Vitamin D deficiency, unspecified: Secondary | ICD-10-CM | POA: Diagnosis not present

## 2019-09-01 DIAGNOSIS — K219 Gastro-esophageal reflux disease without esophagitis: Secondary | ICD-10-CM | POA: Diagnosis not present

## 2019-09-01 DIAGNOSIS — I5032 Chronic diastolic (congestive) heart failure: Secondary | ICD-10-CM | POA: Diagnosis not present

## 2019-09-01 DIAGNOSIS — G5711 Meralgia paresthetica, right lower limb: Secondary | ICD-10-CM | POA: Diagnosis not present

## 2019-09-01 DIAGNOSIS — E785 Hyperlipidemia, unspecified: Secondary | ICD-10-CM | POA: Diagnosis not present

## 2019-09-01 DIAGNOSIS — M79662 Pain in left lower leg: Secondary | ICD-10-CM | POA: Diagnosis not present

## 2019-09-01 DIAGNOSIS — D510 Vitamin B12 deficiency anemia due to intrinsic factor deficiency: Secondary | ICD-10-CM | POA: Diagnosis not present

## 2019-09-01 DIAGNOSIS — I110 Hypertensive heart disease with heart failure: Secondary | ICD-10-CM | POA: Diagnosis not present

## 2019-09-01 DIAGNOSIS — C50312 Malignant neoplasm of lower-inner quadrant of left female breast: Secondary | ICD-10-CM | POA: Diagnosis not present

## 2019-09-01 DIAGNOSIS — M7601 Gluteal tendinitis, right hip: Secondary | ICD-10-CM | POA: Diagnosis not present

## 2019-09-01 DIAGNOSIS — H548 Legal blindness, as defined in USA: Secondary | ICD-10-CM | POA: Diagnosis not present

## 2019-11-03 ENCOUNTER — Encounter: Payer: Self-pay | Admitting: Internal Medicine

## 2019-11-03 ENCOUNTER — Ambulatory Visit (INDEPENDENT_AMBULATORY_CARE_PROVIDER_SITE_OTHER): Payer: Medicare Other | Admitting: Internal Medicine

## 2019-11-03 ENCOUNTER — Other Ambulatory Visit: Payer: Self-pay

## 2019-11-03 VITALS — BP 120/74 | HR 84 | Temp 98.2°F | Ht 62.0 in | Wt 153.0 lb

## 2019-11-03 DIAGNOSIS — I10 Essential (primary) hypertension: Secondary | ICD-10-CM | POA: Diagnosis not present

## 2019-11-03 DIAGNOSIS — D510 Vitamin B12 deficiency anemia due to intrinsic factor deficiency: Secondary | ICD-10-CM

## 2019-11-03 DIAGNOSIS — E785 Hyperlipidemia, unspecified: Secondary | ICD-10-CM | POA: Diagnosis not present

## 2019-11-03 DIAGNOSIS — Z0000 Encounter for general adult medical examination without abnormal findings: Secondary | ICD-10-CM

## 2019-11-03 DIAGNOSIS — R600 Localized edema: Secondary | ICD-10-CM

## 2019-11-03 DIAGNOSIS — E559 Vitamin D deficiency, unspecified: Secondary | ICD-10-CM

## 2019-11-03 DIAGNOSIS — M1A0710 Idiopathic chronic gout, right ankle and foot, without tophus (tophi): Secondary | ICD-10-CM | POA: Diagnosis not present

## 2019-11-03 DIAGNOSIS — J301 Allergic rhinitis due to pollen: Secondary | ICD-10-CM

## 2019-11-03 DIAGNOSIS — R7303 Prediabetes: Secondary | ICD-10-CM | POA: Diagnosis not present

## 2019-11-03 MED ORDER — VITAMIN D 50 MCG (2000 UT) PO TABS: ORAL_TABLET | ORAL | 1 refills | Status: AC

## 2019-11-03 MED ORDER — PRAVASTATIN SODIUM 40 MG PO TABS: 40.0000 mg | ORAL_TABLET | Freq: Every day | ORAL | 1 refills | Status: DC

## 2019-11-03 MED ORDER — LEVOCETIRIZINE DIHYDROCHLORIDE 5 MG PO TABS: 5.0000 mg | ORAL_TABLET | Freq: Every evening | ORAL | 1 refills | Status: AC

## 2019-11-03 MED ORDER — TORSEMIDE 10 MG PO TABS: 10.0000 mg | ORAL_TABLET | ORAL | 1 refills | Status: DC | PRN

## 2019-11-03 NOTE — Patient Instructions (Signed)
Health Maintenance, Female Adopting a healthy lifestyle and getting preventive care are important in promoting health and wellness. Ask your health care provider about:  The right schedule for you to have regular tests and exams.  Things you can do on your own to prevent diseases and keep yourself healthy. What should I know about diet, weight, and exercise? Eat a healthy diet   Eat a diet that includes plenty of vegetables, fruits, low-fat dairy products, and lean protein.  Do not eat a lot of foods that are high in solid fats, added sugars, or sodium. Maintain a healthy weight Body mass index (BMI) is used to identify weight problems. It estimates body fat based on height and weight. Your health care provider can help determine your BMI and help you achieve or maintain a healthy weight. Get regular exercise Get regular exercise. This is one of the most important things you can do for your health. Most adults should:  Exercise for at least 150 minutes each week. The exercise should increase your heart rate and make you sweat (moderate-intensity exercise).  Do strengthening exercises at least twice a week. This is in addition to the moderate-intensity exercise.  Spend less time sitting. Even light physical activity can be beneficial. Watch cholesterol and blood lipids Have your blood tested for lipids and cholesterol at 84 years of age, then have this test every 5 years. Have your cholesterol levels checked more often if:  Your lipid or cholesterol levels are high.  You are older than 84 years of age.  You are at high risk for heart disease. What should I know about cancer screening? Depending on your health history and family history, you may need to have cancer screening at various ages. This may include screening for:  Breast cancer.  Cervical cancer.  Colorectal cancer.  Skin cancer.  Lung cancer. What should I know about heart disease, diabetes, and high blood  pressure? Blood pressure and heart disease  High blood pressure causes heart disease and increases the risk of stroke. This is more likely to develop in people who have high blood pressure readings, are of African descent, or are overweight.  Have your blood pressure checked: ? Every 3-5 years if you are 18-39 years of age. ? Every year if you are 40 years old or older. Diabetes Have regular diabetes screenings. This checks your fasting blood sugar level. Have the screening done:  Once every three years after age 40 if you are at a normal weight and have a low risk for diabetes.  More often and at a younger age if you are overweight or have a high risk for diabetes. What should I know about preventing infection? Hepatitis B If you have a higher risk for hepatitis B, you should be screened for this virus. Talk with your health care provider to find out if you are at risk for hepatitis B infection. Hepatitis C Testing is recommended for:  Everyone born from 1945 through 1965.  Anyone with known risk factors for hepatitis C. Sexually transmitted infections (STIs)  Get screened for STIs, including gonorrhea and chlamydia, if: ? You are sexually active and are younger than 84 years of age. ? You are older than 84 years of age and your health care provider tells you that you are at risk for this type of infection. ? Your sexual activity has changed since you were last screened, and you are at increased risk for chlamydia or gonorrhea. Ask your health care provider if   you are at risk.  Ask your health care provider about whether you are at high risk for HIV. Your health care provider may recommend a prescription medicine to help prevent HIV infection. If you choose to take medicine to prevent HIV, you should first get tested for HIV. You should then be tested every 3 months for as long as you are taking the medicine. Pregnancy  If you are about to stop having your period (premenopausal) and  you may become pregnant, seek counseling before you get pregnant.  Take 400 to 800 micrograms (mcg) of folic acid every day if you become pregnant.  Ask for birth control (contraception) if you want to prevent pregnancy. Osteoporosis and menopause Osteoporosis is a disease in which the bones lose minerals and strength with aging. This can result in bone fractures. If you are 65 years old or older, or if you are at risk for osteoporosis and fractures, ask your health care provider if you should:  Be screened for bone loss.  Take a calcium or vitamin D supplement to lower your risk of fractures.  Be given hormone replacement therapy (HRT) to treat symptoms of menopause. Follow these instructions at home: Lifestyle  Do not use any products that contain nicotine or tobacco, such as cigarettes, e-cigarettes, and chewing tobacco. If you need help quitting, ask your health care provider.  Do not use street drugs.  Do not share needles.  Ask your health care provider for help if you need support or information about quitting drugs. Alcohol use  Do not drink alcohol if: ? Your health care provider tells you not to drink. ? You are pregnant, may be pregnant, or are planning to become pregnant.  If you drink alcohol: ? Limit how much you use to 0-1 drink a day. ? Limit intake if you are breastfeeding.  Be aware of how much alcohol is in your drink. In the [place] one drink equals one 12 oz bottle of beer (355 mL), one 5 oz glass of wine (148 mL), or one 1 oz glass of hard liquor (44 mL). General instructions  Schedule regular health, dental, and eye exams.  Stay current with your vaccines.  Tell your health care provider if: ? You often feel depressed. ? You have ever been abused or do not feel safe at home. Summary  Adopting a healthy lifestyle and getting preventive care are important in promoting health and wellness.  Follow your health care provider's instructions about healthy  diet, exercising, and getting tested or screened for diseases.  Follow your health care provider's instructions on monitoring your cholesterol and blood pressure. This information is not intended to replace advice given to you by your health care provider. Make sure you discuss any questions you have with your health care provider. Document Revised: 03/03/2018 Document Reviewed: 03/03/2018 Elsevier Patient Education  2020 [employer]  

## 2019-11-03 NOTE — Progress Notes (Signed)
Subjective:  Patient ID: [name], female    DOB: [birth year]  Age: 86 y.o. MRN: [id number]  CC: Anemia, Annual Exam, and Hyperlipidemia  This visit occurred during the SARS-CoV-2 public health emergency.  Safety protocols were in place, including screening questions prior to the visit, additional usage of staff PPE, and extensive cleaning of exam room while observing appropriate contact time as indicated for disinfecting solutions.    HPI [name] presents for a CPX.  She is with family members today and they have noticed that she has had a slight decrease in her appetite with some weight loss.  She otherwise feels like she is at her baseline with no other new symptoms.  Outpatient Medications Prior to Visit  Medication Sig Dispense Refill  . ibuprofen (ADVIL,MOTRIN) 200 MG tablet Take 200 mg by mouth every 6 (six) hours as needed.    . Cholecalciferol (VITAMIN D) 50 MCG (2000 UT) tablet TAKE 2 TABLETS (4,000 UNITS TOTAL) BY MOUTH DAILY. 180 tablet 1  . pravastatin (PRAVACHOL) 40 MG tablet TAKE 1 TABLET BY MOUTH EVERYDAY AT BEDTIME 90 tablet 1  . torsemide (DEMADEX) 10 MG tablet TAKE 1 TABLET (10 MG TOTAL) BY MOUTH AS NEEDED. 90 tablet 1  . gabapentin (NEURONTIN) 300 MG capsule Take 1 capsule (300 mg total) by mouth 3 (three) times daily as needed (Nerve pain). Take mostly at night (Patient not taking: Reported on 11/03/2019) 180 capsule 1   No facility-administered medications prior to visit.    ROS Review of Systems  Constitutional: Positive for appetite change, fever and unexpected weight change. Negative for chills, diaphoresis and fatigue.  HENT: Positive for congestion, postnasal drip and rhinorrhea. Negative for nosebleeds, sore throat and trouble swallowing.   Eyes: Positive for visual disturbance. Negative for pain.  Respiratory: Negative for cough, chest tightness, shortness of breath and wheezing.   Cardiovascular: Positive for leg swelling. Negative for  chest pain and palpitations.  Gastrointestinal: Negative for abdominal pain, blood in stool, constipation, diarrhea, nausea and vomiting.  Endocrine: Negative.   Genitourinary: Negative for difficulty urinating, dysuria and hematuria.  Musculoskeletal: Negative.  Negative for arthralgias and myalgias.  Skin: Negative.  Negative for color change, pallor and rash.  Neurological: Negative.  Negative for dizziness, light-headedness and numbness.  Hematological: Negative for adenopathy. Does not bruise/bleed easily.    Objective:  BP 120/74 (BP Location: Left Arm, Patient Position: Sitting, Cuff Size: Normal)   Pulse 84   Temp 98.2 F (36.8 C) (Oral)   Ht 5\' 2"  (1.575 m)   Wt 153 lb (69.4 kg)   SpO2 95%   BMI 27.98 kg/m   BP Readings from Last 3 Encounters:  11/03/19 120/74  08/26/19 120/74  07/15/19 130/72    Wt Readings from Last 3 Encounters:  11/03/19 153 lb (69.4 kg)  08/26/19 161 lb 9.6 oz (73.3 kg)  07/15/19 159 lb 3.2 oz (72.2 kg)    Physical Exam Vitals reviewed.  HENT:     Nose: Nose normal.     Mouth/Throat:     Mouth: Mucous membranes are moist.  Eyes:     General: No scleral icterus.    Conjunctiva/sclera: Conjunctivae normal.  Cardiovascular:     Rate and Rhythm: Normal rate and regular rhythm.     Heart sounds: No murmur heard.   Pulmonary:     Effort: Pulmonary effort is normal.     Breath sounds: No stridor. No wheezing, rhonchi or rales.  Abdominal:  General: Abdomen is protuberant. Bowel sounds are normal. There is no distension.     Palpations: Abdomen is soft. There is no hepatomegaly, splenomegaly or mass.     Tenderness: There is no abdominal tenderness.  Musculoskeletal:        General: No swelling. Normal range of motion.     Cervical back: Neck supple.     Right lower leg: Edema (trace edema) present.     Left lower leg: Edema (trace edema) present.  Lymphadenopathy:     Cervical: No cervical adenopathy.  Skin:    General: Skin is  warm and dry.     Findings: No rash.  Neurological:     General: No focal deficit present.     Mental Status: She is alert. Mental status is at baseline.  Psychiatric:        Mood and Affect: Mood normal.        Behavior: Behavior normal.     Lab Results  Component Value Date   WBC 4.6 11/03/2019   HGB 12.4 11/03/2019   HCT 37.5 11/03/2019   PLT 229 11/03/2019   GLUCOSE 94 11/03/2019   CHOL 262 (H) 11/03/2019   TRIG 118 11/03/2019   HDL 76 11/03/2019   LDLDIRECT 82.0 08/11/2017   LDLCALC 162 (H) 11/03/2019   ALT 12 11/03/2019   AST 20 11/03/2019   NA 139 11/03/2019   K 4.6 11/03/2019   CL 103 11/03/2019   CREATININE 0.88 11/03/2019   BUN 15 11/03/2019   CO2 27 11/03/2019   TSH 1.48 11/03/2019   HGBA1C 6.1 (H) 11/03/2019    VAS [place] LOWER EXTREMITY VENOUS (DVT)  Result Date: 06/02/2019  Lower Venous DVTStudy Other Indications: Patient complains of left calf pain for one week. She denies                    any shortness of breath. Risk Factors: None identified. Performing Technologist: Wilkie Aye RVT  Examination Guidelines: A complete evaluation includes B-mode imaging, spectral Doppler, color Doppler, and power Doppler as needed of all accessible portions of each vessel. Bilateral testing is considered an integral part of a complete examination. Limited examinations for reoccurring indications may be performed as noted. The reflux portion of the exam is performed with the patient in reverse Trendelenburg.  +-----+---------------+---------+-----------+----------+--------------+ RIGHTCompressibilityPhasicitySpontaneityPropertiesThrombus Aging +-----+---------------+---------+-----------+----------+--------------+ CFV  Full           Yes      Yes                                 +-----+---------------+---------+-----------+----------+--------------+   +---------+---------------+---------+-----------+----------+--------------+ LEFT      CompressibilityPhasicitySpontaneityPropertiesThrombus Aging +---------+---------------+---------+-----------+----------+--------------+ CFV      Full           Yes      Yes                                 +---------+---------------+---------+-----------+----------+--------------+ SFJ      Full           Yes      Yes                                 +---------+---------------+---------+-----------+----------+--------------+ FV Prox  Full           Yes  Yes                                 +---------+---------------+---------+-----------+----------+--------------+ FV Mid   Full           Yes      Yes                                 +---------+---------------+---------+-----------+----------+--------------+ FV DistalFull           Yes      Yes                                 +---------+---------------+---------+-----------+----------+--------------+ PFV      Full           Yes      Yes                                 +---------+---------------+---------+-----------+----------+--------------+ POP      Full           Yes      Yes                                 +---------+---------------+---------+-----------+----------+--------------+ PTV      Full           Yes      Yes                                 +---------+---------------+---------+-----------+----------+--------------+ PERO     Full           Yes      Yes                                 +---------+---------------+---------+-----------+----------+--------------+ Soleal   Full           Yes      Yes                                 +---------+---------------+---------+-----------+----------+--------------+ Gastroc  Full           Yes      Yes                                 +---------+---------------+---------+-----------+----------+--------------+ GSV      Full           Yes      Yes                                  +---------+---------------+---------+-----------+----------+--------------+     Summary: RIGHT: - No evidence of common femoral vein obstruction.  LEFT: - No evidence of deep vein thrombosis in the lower extremity. No indirect evidence of obstruction proximal to the inguinal ligament. - No cystic structure found in the popliteal fossa.  *See table(s) above for measurements and observations. Electronically signed by Ena Dawley MD on 06/02/2019 at 5:33:33 PM.  Final     Assessment & Plan:   Cooper was seen today for anemia, annual exam and hyperlipidemia.  Diagnoses and all orders for this visit:  Essential hypertension- Her blood pressure is adequately well controlled.  Labs are negative for secondary causes or endorgan damage. -     torsemide (DEMADEX) 10 MG tablet; Take 1 tablet (10 mg total) by mouth as needed. -     BASIC METABOLIC PANEL WITH GFR; Future -     TSH; Future -     Urinalysis, Routine w reflex microscopic; Future -     Urinalysis, Routine w reflex microscopic -     TSH -     BASIC METABOLIC PANEL WITH GFR  Vitamin B12 deficiency anemia due to intrinsic factor deficiency- Her H&H, B12, and folate levels are normal now. -     CBC with Differential/Platelet; Future -     Vitamin B12; Future -     Folate; Future -     Folate -     Vitamin B12 -     CBC with Differential/Platelet  Vitamin D deficiency -     Cholecalciferol (VITAMIN D) 50 MCG (2000 UT) tablet; TAKE 2 TABLETS (4,000 UNITS TOTAL) BY MOUTH DAILY.  Routine health maintenance- Exam completed, labs reviewed, vaccines reviewed and updated, no cancer screenings are indicated, patient education material was given.  Prediabetes- Her A1c is at 6.1%.  Medical therapy is not indicated. 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 -     Uric acid; Future -     Uric acid  Hyperlipidemia with target LDL less than 130- She has not achieved her LDL goal.  I have asked her to restart pravastatin. -     pravastatin (PRAVACHOL) 40 MG tablet; Take 1 tablet (40 mg total) by mouth daily. -     Lipid panel; Future -     TSH; Future -     Hepatic function panel; Future -     Hepatic function panel -     TSH -     Lipid panel  Localized edema-we will rerestart torsemide. -     torsemide (DEMADEX) 10 MG tablet; Take 1 tablet (10 mg total) by mouth as needed.  Non-seasonal allergic rhinitis due to pollen -     levocetirizine (XYZAL) 5 MG tablet; Take 1 tablet (5 mg total) by mouth every evening.  Other orders -     MICROSCOPIC MESSAGE   I have discontinued Raja L. Channing's gabapentin. I have also changed her pravastatin. Additionally, I am having her start on levocetirizine. Lastly, I am having her maintain her ibuprofen, Vitamin D, and torsemide.  Meds ordered this encounter  Medications  . Cholecalciferol (VITAMIN D) 50 MCG (2000 UT) tablet    Sig: TAKE 2 TABLETS (4,000 UNITS TOTAL) BY MOUTH DAILY.    Dispense:  180 tablet    Refill:  1  . pravastatin (PRAVACHOL) 40 MG tablet    Sig: Take 1 tablet (40 mg total) by mouth daily.    Dispense:  90 tablet    Refill:  1  . torsemide (DEMADEX) 10 MG tablet    Sig: Take 1 tablet (10 mg  total) by mouth as needed.    Dispense:  90 tablet    Refill:  1  . levocetirizine (XYZAL) 5 MG tablet    Sig: Take 1 tablet (5 mg total) by mouth every evening.    Dispense:  90 tablet    Refill:  1   In addition to time spent on CPE, I spent 50 minutes in preparing to see the patient by review of recent labs, imaging and procedures, obtaining and reviewing separately obtained history, communicating with the patient and family or caregiver, ordering medications, tests or procedures, and documenting clinical information in the EHR including the  differential Dx, treatment, and any further evaluation and other management of 1. Essential hypertension 2. Vitamin B12 deficiency anemia due to intrinsic factor deficiency 3. Vitamin D deficiency 4. Prediabetes 5. Idiopathic chronic gout of right ankle without tophus 6. Hyperlipidemia with target LDL less than 130 7. Localized edema 8. Non-seasonal allergic rhinitis due to pollen      Follow-up: Return in about 6 months (around 05/05/2020).  Scarlette Calico, MD

## 2019-11-04 LAB — HEPATIC FUNCTION PANEL
AG Ratio: 1.4 (calc) (ref 1.0–2.5)
ALT: 12 U/L (ref 6–29)
AST: 20 U/L (ref 10–35)
Albumin: 4.7 g/dL (ref 3.6–5.1)
Alkaline phosphatase (APISO): 77 U/L (ref 37–153)
Bilirubin, Direct: 0.1 mg/dL (ref 0.0–0.2)
Globulin: 3.3 g/dL (calc) (ref 1.9–3.7)
Indirect Bilirubin: 0.4 mg/dL (calc) (ref 0.2–1.2)
Total Bilirubin: 0.5 mg/dL (ref 0.2–1.2)
Total Protein: 8 g/dL (ref 6.1–8.1)

## 2019-11-04 LAB — BASIC METABOLIC PANEL WITH GFR
BUN: 15 mg/dL (ref 7–25)
CO2: 27 mmol/L (ref 20–32)
Calcium: 10.3 mg/dL (ref 8.6–10.4)
Chloride: 103 mmol/L (ref 98–110)
Creat: 0.88 mg/dL (ref 0.60–0.88)
GFR, Est African American: 70 mL/min/{1.73_m2} (ref >=60)
GFR, Est Non African American: 60 mL/min/{1.73_m2} (ref >=60)
Glucose, Bld: 94 mg/dL (ref 65–99)
Potassium: 4.6 mmol/L (ref 3.5–5.3)
Sodium: 139 mmol/L (ref 135–146)

## 2019-11-04 LAB — LIPID PANEL
Cholesterol: 262 mg/dL — ABNORMAL HIGH (ref <200)
HDL: 76 mg/dL (ref >=50)
LDL Cholesterol (Calc): 162 mg/dL (calc) — ABNORMAL HIGH
Non-HDL Cholesterol (Calc): 186 mg/dL (calc) — ABNORMAL HIGH (ref <130)
Total CHOL/HDL Ratio: 3.4 (calc) (ref <5.0)
Triglycerides: 118 mg/dL (ref <150)

## 2019-11-04 LAB — HEMOGLOBIN A1C
Hgb A1c MFr Bld: 6.1 % of total Hgb — ABNORMAL HIGH (ref <5.7)
Mean Plasma Glucose: 128 (calc)
eAG (mmol/L): 7.1 (calc)

## 2019-11-04 LAB — URINALYSIS, ROUTINE W REFLEX MICROSCOPIC
Bacteria, UA: NONE SEEN /HPF
Bilirubin Urine: NEGATIVE
Glucose, UA: NEGATIVE
Hgb urine dipstick: NEGATIVE
Hyaline Cast: NONE SEEN /LPF
Ketones, ur: NEGATIVE
Nitrite: NEGATIVE
Protein, ur: NEGATIVE
Specific Gravity, Urine: 1.008 (ref 1.001–1.03)
pH: 7 (ref 5.0–8.0)

## 2019-11-04 LAB — CBC WITH DIFFERENTIAL/PLATELET
Absolute Monocytes: 281 cells/uL (ref 200–950)
Basophils Absolute: 18 cells/uL (ref 0–200)
Basophils Relative: 0.4 %
Eosinophils Absolute: 32 cells/uL (ref 15–500)
Eosinophils Relative: 0.7 %
HCT: 37.5 % (ref 35.0–45.0)
Hemoglobin: 12.4 g/dL (ref 11.7–15.5)
Lymphs Abs: 2019 cells/uL (ref 850–3900)
MCH: 27.3 pg (ref 27.0–33.0)
MCHC: 33.1 g/dL (ref 32.0–36.0)
MCV: 82.4 fL (ref 80.0–100.0)
MPV: 10.9 fL (ref 7.5–12.5)
Monocytes Relative: 6.1 %
Neutro Abs: 2249 cells/uL (ref 1500–7800)
Neutrophils Relative %: 48.9 %
Platelets: 229 10*3/uL (ref 140–400)
RBC: 4.55 10*6/uL (ref 3.80–5.10)
RDW: 13.8 % (ref 11.0–15.0)
Total Lymphocyte: 43.9 %
WBC: 4.6 10*3/uL (ref 3.8–10.8)

## 2019-11-04 LAB — URIC ACID: Uric Acid, Serum: 5.1 mg/dL (ref 2.5–7.0)

## 2019-11-04 LAB — TSH: TSH: 1.48 mIU/L (ref 0.40–4.50)

## 2019-11-04 LAB — FOLATE: Folate: 10.8 ng/mL

## 2019-11-04 LAB — VITAMIN B12: Vitamin B-12: 402 pg/mL (ref 200–1100)

## 2019-11-10 ENCOUNTER — Ambulatory Visit: Payer: Medicare Other | Admitting: Internal Medicine

## 2019-12-22 ENCOUNTER — Telehealth: Payer: Self-pay | Admitting: Internal Medicine

## 2019-12-22 NOTE — Telephone Encounter (Signed)
Patient called and said she has been summoned for jury duty in December 2021 and she was wondering if Dr. Ronnald Ramp could write her an exemption note. Patient was requesting that the letter be mailed     Please call pt back: [phone]

## 2019-12-25 ENCOUNTER — Encounter: Payer: Self-pay | Admitting: Internal Medicine

## 2020-01-16 DIAGNOSIS — H01026 Squamous blepharitis left eye, unspecified eyelid: Secondary | ICD-10-CM | POA: Diagnosis not present

## 2020-01-16 DIAGNOSIS — H01023 Squamous blepharitis right eye, unspecified eyelid: Secondary | ICD-10-CM | POA: Diagnosis not present

## 2020-05-07 DIAGNOSIS — H44523 Atrophy of globe, bilateral: Secondary | ICD-10-CM | POA: Diagnosis not present

## 2020-05-07 DIAGNOSIS — H01026 Squamous blepharitis left eye, unspecified eyelid: Secondary | ICD-10-CM | POA: Diagnosis not present

## 2020-05-07 DIAGNOSIS — H01023 Squamous blepharitis right eye, unspecified eyelid: Secondary | ICD-10-CM | POA: Diagnosis not present

## 2020-05-10 ENCOUNTER — Other Ambulatory Visit: Payer: Self-pay | Admitting: Psychiatry

## 2020-05-10 ENCOUNTER — Other Ambulatory Visit: Payer: Self-pay | Admitting: Ophthalmology

## 2020-05-10 DIAGNOSIS — H05112 Granuloma of left orbit: Secondary | ICD-10-CM | POA: Diagnosis not present

## 2020-05-10 DIAGNOSIS — H543 Unqualified visual loss, both eyes: Secondary | ICD-10-CM | POA: Diagnosis not present

## 2020-05-10 DIAGNOSIS — H10402 Unspecified chronic conjunctivitis, left eye: Secondary | ICD-10-CM | POA: Diagnosis not present

## 2020-05-28 ENCOUNTER — Ambulatory Visit
Admission: RE | Admit: 2020-05-28 | Discharge: 2020-05-28 | Disposition: A | Discharge status: Home / Self Care | Payer: Medicare Other | Source: Ambulatory Visit | Attending: Ophthalmology | Admitting: Ophthalmology

## 2020-05-28 ENCOUNTER — Other Ambulatory Visit: Payer: Self-pay

## 2020-05-28 DIAGNOSIS — H44523 Atrophy of globe, bilateral: Secondary | ICD-10-CM | POA: Diagnosis not present

## 2020-05-28 DIAGNOSIS — H05112 Granuloma of left orbit: Secondary | ICD-10-CM

## 2020-05-28 MED ORDER — IOPAMIDOL (ISOVUE-300) INJECTION 61%
75.0000 mL | Freq: Once | INTRAVENOUS | Status: AC | PRN
  Administered 2020-05-28: 75 mL via INTRAVENOUS

## 2020-07-26 DIAGNOSIS — H05112 Granuloma of left orbit: Secondary | ICD-10-CM | POA: Diagnosis not present

## 2020-07-26 DIAGNOSIS — H10402 Unspecified chronic conjunctivitis, left eye: Secondary | ICD-10-CM | POA: Diagnosis not present

## 2020-07-26 DIAGNOSIS — H543 Unqualified visual loss, both eyes: Secondary | ICD-10-CM | POA: Diagnosis not present

## 2020-08-02 DIAGNOSIS — Z1231 Encounter for screening mammogram for malignant neoplasm of breast: Secondary | ICD-10-CM | POA: Diagnosis not present

## 2020-08-02 LAB — HM MAMMOGRAPHY

## 2020-11-04 NOTE — Patient Instructions (Addendum)
Blood work was ordered.     Medications changes include :   none       Health Maintenance, Female Adopting a healthy lifestyle and getting preventive care are important in promoting health and wellness. Ask your health care provider about: The right schedule for you to have regular tests and exams. Things you can do on your own to prevent diseases and keep yourself healthy. What should I know about diet, weight, and exercise? Eat a healthy diet  Eat a diet that includes plenty of vegetables, fruits, low-fat dairy products, and lean protein. Do not eat a lot of foods that are high in solid fats, added sugars, or sodium.  Maintain a healthy weight Body mass index (BMI) is used to identify weight problems. It estimates body fat based on height and weight. Your health care provider can help determineyour BMI and help you achieve or maintain a healthy weight. Get regular exercise Get regular exercise. This is one of the most important things you can do for your health. Most adults should: Exercise for at least 150 minutes each week. The exercise should increase your heart rate and make you sweat (moderate-intensity exercise). Do strengthening exercises at least twice a week. This is in addition to the moderate-intensity exercise. Spend less time sitting. Even light physical activity can be beneficial. Watch cholesterol and blood lipids Have your blood tested for lipids and cholesterol at 85 years of age, then havethis test every 5 years. Have your cholesterol levels checked more often if: Your lipid or cholesterol levels are high. You are older than 85 years of age. You are at high risk for heart disease. What should I know about cancer screening? Depending on your health history and family history, you may need to have cancer screening at various ages. This may include screening for: Breast cancer. Cervical cancer. Colorectal cancer. Skin cancer. Lung cancer. What should I know  about heart disease, diabetes, and high blood pressure? Blood pressure and heart disease High blood pressure causes heart disease and increases the risk of stroke. This is more likely to develop in people who have high blood pressure readings, are of African descent, or are overweight. Have your blood pressure checked: Every 3-5 years if you are 25-16 years of age. Every year if you are 30 years old or older. Diabetes Have regular diabetes screenings. This checks your fasting blood sugar level. Have the screening done: Once every three years after age 17 if you are at a normal weight and have a low risk for diabetes. More often and at a younger age if you are overweight or have a high risk for diabetes. What should I know about preventing infection? Hepatitis B If you have a higher risk for hepatitis B, you should be screened for this virus. Talk with your health care provider to find out if you are at risk forhepatitis B infection. Hepatitis C Testing is recommended for: Everyone born from [birth year] through 1963. Anyone with known risk factors for hepatitis C. Sexually transmitted infections (STIs) Get screened for STIs, including gonorrhea and chlamydia, if: You are sexually active and are younger than 85 years of age. You are older than 85 years of age and your health care provider tells you that you are at risk for this type of infection. Your sexual activity has changed since you were last screened, and you are at increased risk for chlamydia or gonorrhea. Ask your health care provider if you are at risk. Ask your health care  provider about whether you are at high risk for HIV. Your health care provider may recommend a prescription medicine to help prevent HIV infection. If you choose to take medicine to prevent HIV, you should first get tested for HIV. You should then be tested every 3 months for as long as you are taking the medicine. Pregnancy If you are about to stop having your period  (premenopausal) and you may become pregnant, seek counseling before you get pregnant. Take 400 to 800 micrograms (mcg) of folic acid every day if you become pregnant. Ask for birth control (contraception) if you want to prevent pregnancy. Osteoporosis and menopause Osteoporosis is a disease in which the bones lose minerals and strength with aging. This can result in bone fractures. If you are 46 years old or older, or if you are at risk for osteoporosis and fractures, ask your health care provider if you should: Be screened for bone loss. Take a calcium or vitamin D supplement to lower your risk of fractures. Be given hormone replacement therapy (HRT) to treat symptoms of menopause. Follow these instructions at home: Lifestyle Do not use any products that contain nicotine or tobacco, such as cigarettes, e-cigarettes, and chewing tobacco. If you need help quitting, ask your health care provider. Do not use street drugs. Do not share needles. Ask your health care provider for help if you need support or information about quitting drugs. Alcohol use Do not drink alcohol if: Your health care provider tells you not to drink. You are pregnant, may be pregnant, or are planning to become pregnant. If you drink alcohol: Limit how much you use to 0-1 drink a day. Limit intake if you are breastfeeding. Be aware of how much alcohol is in your drink. In the [place] one drink equals one 12 oz bottle of beer (355 mL), one 5 oz glass of wine (148 mL), or one 1 oz glass of hard liquor (44 mL). General instructions Schedule regular health, dental, and eye exams. Stay current with your vaccines. Tell your health care provider if: You often feel depressed. You have ever been abused or do not feel safe at home. Summary Adopting a healthy lifestyle and getting preventive care are important in promoting health and wellness. Follow your health care provider's instructions about healthy diet, exercising, and  getting tested or screened for diseases. Follow your health care provider's instructions on monitoring your cholesterol and blood pressure. This information is not intended to replace advice given to you by your health care provider. Make sure you discuss any questions you have with your healthcare provider. Document Revised: 03/03/2018 Document Reviewed: 03/03/2018 Elsevier Patient Education  2022 [employer].

## 2020-11-04 NOTE — Progress Notes (Signed)
Subjective:    Patient ID: [name], female    DOB: 11/04/1933, 87 y.o.   MRN: [id number]   This visit occurred during the SARS-CoV-2 public health emergency.  Safety protocols were in place, including screening questions prior to the visit, additional usage of staff PPE, and extensive cleaning of exam room while observing appropriate contact time as indicated for disinfecting solutions.    HPI She is here for a physical exam.   She has no concerns.  She is here with her daughter.   Medications and allergies reviewed with patient and updated if appropriate.  Patient Active Problem List   Diagnosis Date Noted   Non-seasonal allergic rhinitis due to pollen 11/03/2019   Localized edema 10/27/2017   Chronic heart failure with preserved ejection fraction ([place]) 10/27/2017   Vitamin B12 deficiency anemia due to intrinsic factor deficiency 10/27/2017   Gout 10/06/2017   Essential hypertension 07/10/2015   Prediabetes 07/06/2013   Murmur, cardiac 07/06/2013   Malignant neoplasm of lower-inner quadrant of left breast in female, estrogen receptor positive ([place]) 03/31/2013   Routine health maintenance 04/12/2011   Vitamin D deficiency 01/25/2009   Disorder of bone and cartilage 02/07/2008   GERD 10/01/2007   Hyperlipidemia with target LDL less than 130 12/14/2006   BLINDNESS, LEGAL, [place] DEFINITION 12/14/2006    Current Outpatient Medications on File Prior to Visit  Medication Sig Dispense Refill   Cholecalciferol (VITAMIN D) 50 MCG (2000 UT) tablet TAKE 2 TABLETS (4,000 UNITS TOTAL) BY MOUTH DAILY. 180 tablet 1   ibuprofen (ADVIL,MOTRIN) 200 MG tablet Take 200 mg by mouth every 6 (six) hours as needed.     levocetirizine (XYZAL) 5 MG tablet Take 1 tablet (5 mg total) by mouth every evening. 90 tablet 1   pravastatin (PRAVACHOL) 40 MG tablet Take 1 tablet (40 mg total) by mouth daily. 90 tablet 1   torsemide (DEMADEX) 10 MG tablet Take 1 tablet (10 mg total) by mouth as  needed. 90 tablet 1   No current facility-administered medications on file prior to visit.    Past Medical History:  Diagnosis Date   Anemia    Arthritis    Blind    BLINDNESS, LEGAL, [place] DEFINITION 12/14/2006   Cancer ([place])    breast   CONSTIPATION 12/14/2006   Edema    EXTERNAL HEMORRHOIDS 09/30/2007   GERD 10/01/2007   HYPERLIPIDEMIA 12/14/2006   OOPHORECTOMY, RIGHT, HX OF 12/14/2006   OSTEOPENIA 02/07/2008   RECTAL BLEEDING 10/01/2007   ROTATOR CUFF REPAIR, RIGHT, HX OF 12/14/2006   VITAMIN D DEFICIENCY 01/25/2009    Past Surgical History:  Procedure Laterality Date   ABDOMINAL HYSTERECTOMY     IRRIGATION AND DEBRIDEMENT SEBACEOUS CYST  06/07   [name]   LE Art Doppler   '98   normal   Nuc Stress test   07/07   normal study   PARTIAL MASTECTOMY WITH NEEDLE LOCALIZATION Left 05/11/2013   Procedure: PARTIAL MASTECTOMY WITH NEEDLE LOCALIZATION;  Surgeon: [name], MD;  Location: [facility];  Service: General;  Laterality: Left;   RIGHT OOPHORECTOMY     ROTATOR CUFF REPAIR     right   TUBAL LIGATION      Social History   Socioeconomic History   Marital status: Married    Spouse name: Not on file   Number of children: 5   Years of education: 12   Highest education level: Not on file  Occupational History   Not  on file  Tobacco Use   Smoking status: Never   Smokeless tobacco: Never  Vaping Use   Vaping Use: Never used  Substance and Sexual Activity   Alcohol use: No   Drug use: No   Sexual activity: Not Currently  Other Topics Concern   Not on file  Social History Narrative   HSG   Married '61   3 sons- '62, '64, '65; 2 daughters- '60, '66: 5 grandchildren   Lives with husband   Social worker at hand         Social Determinants of Radio broadcast assistant Strain: Not on file  Food Insecurity: Not on file  Transportation Needs: Not on file  Physical Activity: Not on file  Stress: Not on file  Social Connections: Not on  file    Family History  Problem Relation Age of Onset   Hypertension Father    Dementia Father    Cancer Neg Hx        Negative for breast or colon cancer   Heart disease Neg Hx     Review of Systems  Constitutional:  Negative for chills and fever.  HENT:  Negative for congestion and sore throat.   Respiratory:  Negative for cough, shortness of breath and wheezing.   Cardiovascular:  Negative for chest pain and palpitations. Leg swelling: controlled. Gastrointestinal:  Negative for abdominal pain, constipation, diarrhea and nausea.       No gerd  Genitourinary:  Negative for dysuria and frequency.  Musculoskeletal:  Positive for arthralgias (mild). Negative for back pain.  Skin:  Negative for rash.  Neurological:  Negative for dizziness, light-headedness and headaches.  Psychiatric/Behavioral:  Negative for dysphoric mood and sleep disturbance. The patient is not nervous/anxious.       Objective:   Vitals:   11/05/20 0839  BP: 138/88  Pulse: 73  Temp: 98.3 F (36.8 C)  SpO2: 99%   Filed Weights   11/05/20 0839  Weight: 161 lb 3.2 oz (73.1 kg)   Body mass index is 32.56 kg/m.  BP Readings from Last 3 Encounters:  11/05/20 138/88  11/03/19 120/74  08/26/19 120/74    Wt Readings from Last 3 Encounters:  11/05/20 161 lb 3.2 oz (73.1 kg)  11/03/19 153 lb (69.4 kg)  08/26/19 161 lb 9.6 oz (73.3 kg)     Physical Exam Constitutional: She appears well-developed and well-nourished. No distress.  HENT:  Head: Normocephalic and atraumatic.  Right Ear: External ear normal. Normal ear canal with excessive cerumen, TM not visualized.   Left Ear: External ear normal.  Normal ear canal with excessive cerumen, TM not visualized Mouth/Throat: Oropharynx is clear and moist.  Neck: Neck supple. No tracheal deviation present. No thyromegaly present.  No carotid bruit  Cardiovascular: Normal rate, regular rhythm and normal heart sounds.   No murmur heard.  No  edema. Pulmonary/Chest: Effort normal and breath sounds normal. No respiratory distress. She has no wheezes. She has no rales.  Breast: deferred   Abdominal: Soft. She exhibits no distension. There is no tenderness.  Lymphadenopathy: She has no cervical adenopathy.  Skin: Skin is warm and dry. She is not diaphoretic.  Psychiatric: She has a normal mood and affect. Her behavior is normal.     Lab Results  Component Value Date   WBC 4.5 11/05/2020   HGB 11.8 (L) 11/05/2020   HCT 35.8 (L) 11/05/2020   PLT 210.0 11/05/2020   GLUCOSE 90 11/05/2020   CHOL 280 (H) 11/05/2020  TRIG 65.0 11/05/2020   HDL 83.30 11/05/2020   LDLDIRECT 82.0 08/11/2017   LDLCALC 184 (H) 11/05/2020   ALT 11 11/05/2020   AST 18 11/05/2020   NA 140 11/05/2020   K 4.5 11/05/2020   CL 106 11/05/2020   CREATININE 0.95 11/05/2020   BUN 15 11/05/2020   CO2 25 11/05/2020   TSH 1.27 11/05/2020   HGBA1C 6.1 11/05/2020         Assessment & Plan:   Physical exam: Screening blood work  ordered Exercise  none Weight  stable - ok for age Substance abuse  none   Reviewed recommended immunizations.   Health Maintenance  Topic Date Due   Zoster Vaccines- Shingrix (1 of 2) Never done   COVID-19 Vaccine (3 - Pfizer risk series) 02/02/2020   INFLUENZA VACCINE  10/22/2020   TETANUS/TDAP  07/03/2024   DEXA SCAN  Completed   PNA vac Low Risk Adult  Completed   HPV VACCINES  Aged Out          Chronic heart failure with preserved ejection fracture, leg edema: Chronic Euvolemic on exam Taking torsemide 10 mg daily as needed only She will continue torsemide as needed  Hyperlipidemia: Chronic Not taking pravastatin on a daily basis Check lipid panel  Vitamin D deficiency: Now he is taking vitamin D on a regular basis Will check vitamin D level  Prediabetes: Chronic Check A1c  Vitamin B12 deficiency: Chronic Not taking B12 on a regular basis Will check B12 level

## 2020-11-05 ENCOUNTER — Ambulatory Visit (INDEPENDENT_AMBULATORY_CARE_PROVIDER_SITE_OTHER): Payer: Medicare Other | Admitting: Internal Medicine

## 2020-11-05 ENCOUNTER — Encounter: Payer: Self-pay | Admitting: Internal Medicine

## 2020-11-05 ENCOUNTER — Other Ambulatory Visit: Payer: Self-pay

## 2020-11-05 VITALS — BP 138/88 | HR 73 | Temp 98.3°F | Ht 59.0 in | Wt 161.2 lb

## 2020-11-05 DIAGNOSIS — R7303 Prediabetes: Secondary | ICD-10-CM | POA: Diagnosis not present

## 2020-11-05 DIAGNOSIS — Z0000 Encounter for general adult medical examination without abnormal findings: Secondary | ICD-10-CM | POA: Diagnosis not present

## 2020-11-05 DIAGNOSIS — E785 Hyperlipidemia, unspecified: Secondary | ICD-10-CM

## 2020-11-05 DIAGNOSIS — I10 Essential (primary) hypertension: Secondary | ICD-10-CM | POA: Diagnosis not present

## 2020-11-05 DIAGNOSIS — E559 Vitamin D deficiency, unspecified: Secondary | ICD-10-CM

## 2020-11-05 DIAGNOSIS — I5032 Chronic diastolic (congestive) heart failure: Secondary | ICD-10-CM

## 2020-11-05 DIAGNOSIS — R600 Localized edema: Secondary | ICD-10-CM

## 2020-11-05 DIAGNOSIS — D510 Vitamin B12 deficiency anemia due to intrinsic factor deficiency: Secondary | ICD-10-CM

## 2020-11-05 DIAGNOSIS — M1A0710 Idiopathic chronic gout, right ankle and foot, without tophus (tophi): Secondary | ICD-10-CM

## 2020-11-05 LAB — COMPREHENSIVE METABOLIC PANEL
ALT: 11 U/L (ref 0–35)
AST: 18 U/L (ref 0–37)
Albumin: 4.2 g/dL (ref 3.5–5.2)
Alkaline Phosphatase: 72 U/L (ref 39–117)
BUN: 15 mg/dL (ref 6–23)
CO2: 25 mEq/L (ref 19–32)
Calcium: 9.6 mg/dL (ref 8.4–10.5)
Chloride: 106 mEq/L (ref 96–112)
Creatinine, Ser: 0.95 mg/dL (ref 0.40–1.20)
GFR: 54.74 mL/min — ABNORMAL LOW (ref >60.00)
Glucose, Bld: 90 mg/dL (ref 70–99)
Potassium: 4.5 mEq/L (ref 3.5–5.1)
Sodium: 140 mEq/L (ref 135–145)
Total Bilirubin: 0.4 mg/dL (ref 0.2–1.2)
Total Protein: 7.5 g/dL (ref 6.0–8.3)

## 2020-11-05 LAB — HEMOGLOBIN A1C: Hgb A1c MFr Bld: 6.1 % (ref 4.6–6.5)

## 2020-11-05 LAB — LIPID PANEL
Cholesterol: 280 mg/dL — ABNORMAL HIGH (ref 0–200)
HDL: 83.3 mg/dL (ref >39.00)
LDL Cholesterol: 184 mg/dL — ABNORMAL HIGH (ref 0–99)
NonHDL: 196.89
Total CHOL/HDL Ratio: 3
Triglycerides: 65 mg/dL (ref 0.0–149.0)
VLDL: 13 mg/dL (ref 0.0–40.0)

## 2020-11-05 LAB — CBC WITH DIFFERENTIAL/PLATELET
Basophils Absolute: 0 10*3/uL (ref 0.0–0.1)
Basophils Relative: 0.5 % (ref 0.0–3.0)
Eosinophils Absolute: 0.1 10*3/uL (ref 0.0–0.7)
Eosinophils Relative: 1.3 % (ref 0.0–5.0)
HCT: 35.8 % — ABNORMAL LOW (ref 36.0–46.0)
Hemoglobin: 11.8 g/dL — ABNORMAL LOW (ref 12.0–15.0)
Lymphocytes Relative: 34.2 % (ref 12.0–46.0)
Lymphs Abs: 1.5 10*3/uL (ref 0.7–4.0)
MCHC: 32.9 g/dL (ref 30.0–36.0)
MCV: 84.7 fl (ref 78.0–100.0)
Monocytes Absolute: 0.3 10*3/uL (ref 0.1–1.0)
Monocytes Relative: 7.6 % (ref 3.0–12.0)
Neutro Abs: 2.5 10*3/uL (ref 1.4–7.7)
Neutrophils Relative %: 56.4 % (ref 43.0–77.0)
Platelets: 210 10*3/uL (ref 150.0–400.0)
RBC: 4.22 Mil/uL (ref 3.87–5.11)
RDW: 14.7 % (ref 11.5–15.5)
WBC: 4.5 10*3/uL (ref 4.0–10.5)

## 2020-11-05 LAB — VITAMIN B12: Vitamin B-12: 192 pg/mL — ABNORMAL LOW (ref 211–911)

## 2020-11-05 LAB — TSH: TSH: 1.27 u[IU]/mL (ref 0.35–5.50)

## 2020-11-05 LAB — VITAMIN D 25 HYDROXY (VIT D DEFICIENCY, FRACTURES): VITD: 20.59 ng/mL — ABNORMAL LOW (ref 30.00–100.00)

## 2020-11-24 ENCOUNTER — Other Ambulatory Visit: Payer: Self-pay | Admitting: Internal Medicine

## 2020-11-24 DIAGNOSIS — E785 Hyperlipidemia, unspecified: Secondary | ICD-10-CM

## 2021-01-17 ENCOUNTER — Ambulatory Visit: Payer: Medicare Other

## 2021-01-24 ENCOUNTER — Ambulatory Visit (INDEPENDENT_AMBULATORY_CARE_PROVIDER_SITE_OTHER): Payer: Medicare Other

## 2021-01-24 ENCOUNTER — Other Ambulatory Visit: Payer: Self-pay

## 2021-01-24 VITALS — BP 126/80 | HR 78 | Temp 97.9°F | Ht 59.0 in | Wt 157.6 lb

## 2021-01-24 DIAGNOSIS — Z23 Encounter for immunization: Secondary | ICD-10-CM | POA: Diagnosis not present

## 2021-01-24 DIAGNOSIS — Z0000 Encounter for general adult medical examination without abnormal findings: Secondary | ICD-10-CM

## 2021-01-24 NOTE — Progress Notes (Signed)
Subjective:   [name] is a 87 y.o. female who presents for Medicare Annual (Subsequent) preventive examination.  Review of Systems     Cardiac Risk Factors include: advanced age (>57men, >65 women);dyslipidemia;obesity (BMI >30kg/m2);family history of premature cardiovascular disease     Objective:    Today's Vitals   01/24/21 1112  BP: 126/80  Pulse: 78  Temp: 97.9 F (36.6 C)  SpO2: 98%  Weight: 157 lb 9.6 oz (71.5 kg)  Height: 4\' 11"  (1.499 m)   Body mass index is 31.83 kg/m.  Advanced Directives 01/24/2021 02/22/2018 07/30/2016 07/10/2015 05/09/2015 05/01/2014 05/01/2014  Does Patient Have a Medical Advance Directive? No No Yes Yes No No No  Type of Advance Directive - Public librarian;Living will [place];Living will - - -  Does patient want to make changes to medical advance directive? No - Patient declined - - No - Patient declined - - -  Copy of [place] in Chart? - - No - copy requested Yes - - -  Would patient like information on creating a medical advance directive? No - Patient declined Yes (ED - Information included in AVS) - - - - -    Current Medications (verified) Outpatient Encounter Medications as of 01/24/2021  Medication Sig   Cholecalciferol (VITAMIN D) 50 MCG (2000 UT) tablet TAKE 2 TABLETS (4,000 UNITS TOTAL) BY MOUTH DAILY.   ibuprofen (ADVIL,MOTRIN) 200 MG tablet Take 200 mg by mouth every 6 (six) hours as needed.   pravastatin (PRAVACHOL) 40 MG tablet TAKE 1 TABLET BY MOUTH EVERYDAY AT BEDTIME   torsemide (DEMADEX) 10 MG tablet Take 1 tablet (10 mg total) by mouth as needed.   levocetirizine (XYZAL) 5 MG tablet Take 1 tablet (5 mg total) by mouth every evening. (Patient not taking: Reported on 01/24/2021)   No facility-administered encounter medications on file as of 01/24/2021.    Allergies (verified) Sulfa antibiotics   History: Past Medical History:  Diagnosis Date   Anemia     Arthritis    Blind    BLINDNESS, LEGAL, [place] DEFINITION 12/14/2006   Cancer ([place])    breast   CONSTIPATION 12/14/2006   Edema    EXTERNAL HEMORRHOIDS 09/30/2007   GERD 10/01/2007   HYPERLIPIDEMIA 12/14/2006   OOPHORECTOMY, RIGHT, HX OF 12/14/2006   OSTEOPENIA 02/07/2008   RECTAL BLEEDING 10/01/2007   ROTATOR CUFF REPAIR, RIGHT, HX OF 12/14/2006   VITAMIN D DEFICIENCY 01/25/2009   Past Surgical History:  Procedure Laterality Date   ABDOMINAL HYSTERECTOMY     IRRIGATION AND DEBRIDEMENT SEBACEOUS CYST  06/07   [name]   LE Art Doppler   '98   normal   Nuc Stress test   07/07   normal study   PARTIAL MASTECTOMY WITH NEEDLE LOCALIZATION Left 05/11/2013   Procedure: PARTIAL MASTECTOMY WITH NEEDLE LOCALIZATION;  Surgeon: [name], MD;  Location: [facility];  Service: General;  Laterality: Left;   RIGHT OOPHORECTOMY     ROTATOR CUFF REPAIR     right   TUBAL LIGATION     Family History  Problem Relation Age of Onset   Hypertension Father    Dementia Father    Cancer Neg Hx        Negative for breast or colon cancer   Heart disease Neg Hx    Social History   Socioeconomic History   Marital status: Married    Spouse name: Not on file   Number  of children: 5   Years of education: 1   Highest education level: Not on file  Occupational History   Not on file  Tobacco Use   Smoking status: Never   Smokeless tobacco: Never  Vaping Use   Vaping Use: Never used  Substance and Sexual Activity   Alcohol use: No   Drug use: No   Sexual activity: Not Currently  Other Topics Concern   Not on file  Social History Narrative   HSG   Married '61   3 sons- '62, '64, '65; 2 daughters- '60, '66: 5 grandchildren   Lives with husband   Support family-close at hand         Social Determinants of Radio broadcast assistant Strain: Low Risk    Difficulty of Paying Living Expenses: Not hard at all  Food Insecurity: No Food Insecurity   Worried About Sales executive in the Last Year: Never true   Arboriculturist in the Last Year: Never true  Transportation Needs: No Transportation Needs   Lack of Transportation (Medical): No   Lack of Transportation (Non-Medical): No  Physical Activity: Inactive   Days of Exercise per Week: 0 days   Minutes of Exercise per Session: 0 min  Stress: No Stress Concern Present   Feeling of Stress : Not at all  Social Connections: Moderately Integrated   Frequency of Communication with Friends and Family: More than three times a week   Frequency of Social Gatherings with Friends and Family: More than three times a week   Attends Religious Services: More than 4 times per year   Active Member of [employer] or Organizations: Yes   Attends Archivist Meetings: More than 4 times per year   Marital Status: Widowed    Tobacco Counseling Counseling given: Not Answered   Clinical Intake:  Pre-visit preparation completed: Yes  Pain : No/denies pain     BMI - recorded: 31.83 Nutritional Status: BMI > 30  Obese Nutritional Risks: None Diabetes: No  How often do you need to have someone help you when you read instructions, pamphlets, or other written materials from your doctor or pharmacy?: 1 - Never What is the last grade level you completed in school?: High School Graduate  Diabetic? no  Interpreter Needed?: No  Information entered by :: Lisette Abu, LPN   Activities of Daily Living In your present state of health, do you have any difficulty performing the following activities: 01/24/2021 11/05/2020  Hearing? N N  Vision? Y Y  Difficulty concentrating or making decisions? N N  Walking or climbing stairs? N N  Dressing or bathing? N N  Doing errands, shopping? Y N  Preparing Food and eating ? N -  Using the Toilet? N -  In the past six months, have you accidently leaked urine? N -  Do you have problems with loss of bowel control? N -  Managing your Medications? N -  Managing your Finances? N  -  Housekeeping or managing your Housekeeping? Y -  Some recent data might be hidden    Patient Care Team: Janith Lima, MD as PCP - General (Internal Medicine) Newton Pigg, MD as Consulting Physician (Obstetrics and Gynecology) Magrinat, Virgie Dad, MD as Consulting Physician (Oncology) Fanny Skates, MD as Consulting Physician (General Surgery) Gery Pray, MD as Consulting Physician (Radiation Oncology)  Indicate any recent Medical Services you may have received from other than [facility] providers in the past year (date  may be approximate).     Assessment:   This is a routine wellness examination for [facility].  Hearing/Vision screen Hearing Screening - Comments:: Patient denied any hearing difficulty.   No hearing aids.  Vision Screening - Comments:: Patient is legally blind.  Dietary issues and exercise activities discussed: Current Exercise Habits: The patient does not participate in regular exercise at present, Exercise limited by: orthopedic condition(s);cardiac condition(s)   Goals Addressed   None   Depression Screen PHQ 2/9 Scores 01/24/2021 11/05/2020 11/02/2018 02/22/2018 08/11/2017 07/30/2016 07/10/2015  PHQ - 2 Score 0 0 0 1 0 0 0  PHQ- 9 Score - 0 - 2 - - -    Fall Risk Fall Risk  01/24/2021 11/05/2020 11/02/2018 02/22/2018 08/11/2017  Falls in the past year? 0 0 0 0 No  Number falls in past yr: 0 0 0 - -  Injury with Fall? 0 0 0 - -  Risk for fall due to : Impaired vision No Fall Risks Impaired vision Impaired vision -  Follow up Falls evaluation completed Falls evaluation completed Education provided;Falls evaluation completed Falls prevention discussed -    FALL RISK PREVENTION PERTAINING TO THE HOME:  Any stairs in or around the home? No  If so, are there any without handrails? No  Home free of loose throw rugs in walkways, pet beds, electrical cords, etc? Yes  Adequate lighting in your home to reduce risk of falls? Yes   ASSISTIVE DEVICES UTILIZED TO PREVENT  FALLS:  Life alert? No  Use of a cane, walker or w/c? No  Grab bars in the bathroom? Yes  Shower chair or bench in shower? Yes  Elevated toilet seat or a handicapped toilet? Yes   TIMED UP AND GO:  Was the test performed? No.  Length of time to ambulate 10 feet: n/a sec.   Gait steady and fast without use of assistive device  Cognitive Function: Normal cognitive status assessed by direct observation by this Nurse Health Advisor. No abnormalities found.   MMSE - Mini Mental State Exam 02/22/2018  Not completed: Unable to complete        Immunizations Immunization History  Administered Date(s) Administered   Fluad Quad(high Dose 65+) 12/31/2018   Influenza, High Dose Seasonal PF 02/20/2016, 02/04/2017, 02/22/2018   PFIZER(Purple Top)SARS-COV-2 Vaccination 06/08/2019, 01/05/2020   Pfizer Covid-19 Vaccine Bivalent Booster 57yrs & up 01/18/2021   Pneumococcal Conjugate-13 07/04/2014   Pneumococcal Polysaccharide-23 02/06/2009, 07/10/2015   Tdap 07/04/2014   Tetanus 06/30/2012    TDAP status: Up to date  Flu Vaccine status: Completed at today's visit  Pneumococcal vaccine status: Up to date  Covid-19 vaccine status: Completed vaccines  Qualifies for Shingles Vaccine? Yes   Zostavax completed No   Shingrix Completed?: No.    Education has been provided regarding the importance of this vaccine. Patient has been advised to call insurance company to determine out of pocket expense if they have not yet received this vaccine. Advised may also receive vaccine at local pharmacy or Health Dept. Verbalized acceptance and understanding.  Screening Tests Health Maintenance  Topic Date Due   Zoster Vaccines- Shingrix (1 of 2) Never done   INFLUENZA VACCINE  10/22/2020   COVID-19 Vaccine (4 - Booster for Pfizer series) 03/15/2021   TETANUS/TDAP  07/03/2024   Pneumonia Vaccine 51+ Years old  Completed   DEXA SCAN  Completed   HPV VACCINES  Aged Out    Health  Maintenance  Health Maintenance Due  Topic Date Due  Zoster Vaccines- Shingrix (1 of 2) Never done   INFLUENZA VACCINE  10/22/2020    Colorectal cancer screening: No longer required.   Mammogram status: Completed 08/02/2020. Repeat every year  Bone Density status: Completed 08/02/2020. Results reflect: Bone density results: OSTEOPENIA. Repeat every 2-3 years.  Lung Cancer Screening: (Low Dose CT Chest recommended if Age 84-80 years, 30 pack-year currently smoking OR have quit w/in 15years.) does not qualify.   Lung Cancer Screening Referral: no  Additional Screening:  Hepatitis C Screening: does not qualify; Completed no  Vision Screening: Recommended annual ophthalmology exams for early detection of glaucoma and other disorders of the eye. Is the patient up to date with their annual eye exam?  Yes  Who is the provider or what is the name of the office in which the patient attends annual eye exams? Marshall Cork, MD. If pt is not established with a provider, would they like to be referred to a provider to establish care? No .   Dental Screening: Recommended annual dental exams for proper oral hygiene  Community Resource Referral / Chronic Care Management: CRR required this visit?  No   CCM required this visit?  No      Plan:     I have personally reviewed and noted the following in the patient's chart:   Medical and social history Use of alcohol, tobacco or illicit drugs  Current medications and supplements including opioid prescriptions.  Functional ability and status Nutritional status Physical activity Advanced directives List of other physicians Hospitalizations, surgeries, and ER visits in previous 12 months Vitals Screenings to include cognitive, depression, and falls Referrals and appointments  In addition, I have reviewed and discussed with patient certain preventive protocols, quality metrics, and best practice recommendations. A written personalized  care plan for preventive services as well as general preventive health recommendations were provided to patient.     Sheral Flow, LPN   55/09/3218   Nurse Notes:  Hearing Screening - Comments:: Patient denied any hearing difficulty.   No hearing aids.  Vision Screening - Comments:: Patient is legally blind.

## 2021-01-24 NOTE — Patient Instructions (Signed)
[name] , Thank you for taking time to come for your Medicare Wellness Visit. I appreciate your ongoing commitment to your health goals. Please review the following plan we discussed and let me know if I can assist you in the future.   Screening recommendations/referrals: Colonoscopy: Not a candidate for screening due to age. Mammogram: Last done on 08/02/2020; due every 1-2 years Bone Density: Last done 04/20/2015; results: osteopenia Recommended yearly ophthalmology/optometry visit for glaucoma screening and checkup Recommended yearly dental visit for hygiene and checkup  Vaccinations: Influenza vaccine: 01/24/2021 Pneumococcal vaccine: 07/04/2014, 07/10/2015 Tdap vaccine: 07/04/2014; due every 10 years Shingles vaccine: never done   Covid-19: 06/08/2019, 01/05/2020, 01/18/2021  Advanced directives: Please bring a copy of your health care power of attorney and living will to the office at your convenience.   Conditions/risks identified: Yes; Client understands the importance of follow-up with providers by attending scheduled visits and discussed goals to eat healthier, increase physical activity, exercise the brain, socialize more, get enough sleep and make time for laughter.  Next appointment: Please schedule your next Medicare Wellness Visit with your Nurse Health Advisor in 1 year by calling [phone].   Preventive Care 29 Years and Older, Female Preventive care refers to lifestyle choices and visits with your health care provider that can promote health and wellness. What does preventive care include? A yearly physical exam. This is also called an annual well check. Dental exams once or twice a year. Routine eye exams. Ask your health care provider how often you should have your eyes checked. Personal lifestyle choices, including: Daily care of your teeth and gums. Regular physical activity. Eating a healthy diet. Avoiding tobacco and drug use. Limiting alcohol  use. Practicing safe sex. Taking low-dose aspirin every day. Taking vitamin and mineral supplements as recommended by your health care provider. What happens during an annual well check? The services and screenings done by your health care provider during your annual well check will depend on your age, overall health, lifestyle risk factors, and family history of disease. Counseling  Your health care provider may ask you questions about your: Alcohol use. Tobacco use. Drug use. Emotional well-being. Home and relationship well-being. Sexual activity. Eating habits. History of falls. Memory and ability to understand (cognition). Work and work Statistician. Reproductive health. Screening  You may have the following tests or measurements: Height, weight, and BMI. Blood pressure. Lipid and cholesterol levels. These may be checked every 5 years, or more frequently if you are over 68 years old. Skin check. Lung cancer screening. You may have this screening every year starting at age 34 if you have a 30-pack-year history of smoking and currently smoke or have quit within the past 15 years. Fecal occult blood test (FOBT) of the stool. You may have this test every year starting at age 8. Flexible sigmoidoscopy or colonoscopy. You may have a sigmoidoscopy every 5 years or a colonoscopy every 10 years starting at age 76. Hepatitis C blood test. Hepatitis B blood test. Sexually transmitted disease (STD) testing. Diabetes screening. This is done by checking your blood sugar (glucose) after you have not eaten for a while (fasting). You may have this done every 1-3 years. Bone density scan. This is done to screen for osteoporosis. You may have this done starting at age 37. Mammogram. This may be done every 1-2 years. Talk to your health care provider about how often you should have regular mammograms. Talk with your health care provider about your test results, treatment options,  and if necessary,  the need for more tests. Vaccines  Your health care provider may recommend certain vaccines, such as: Influenza vaccine. This is recommended every year. Tetanus, diphtheria, and acellular pertussis (Tdap, Td) vaccine. You may need a Td booster every 10 years. Zoster vaccine. You may need this after age 30. Pneumococcal 13-valent conjugate (PCV13) vaccine. One dose is recommended after age 45. Pneumococcal polysaccharide (PPSV23) vaccine. One dose is recommended after age 75. Talk to your health care provider about which screenings and vaccines you need and how often you need them. This information is not intended to replace advice given to you by your health care provider. Make sure you discuss any questions you have with your health care provider. Document Released: 04/06/2015 Document Revised: 11/28/2015 Document Reviewed: 01/09/2015 Elsevier Interactive Patient Education  2017 [place] Prevention in the Home Falls can cause injuries. They can happen to people of all ages. There are many things you can do to make your home safe and to help prevent falls. What can I do on the outside of my home? Regularly fix the edges of walkways and driveways and fix any cracks. Remove anything that might make you trip as you walk through a door, such as a raised step or threshold. Trim any bushes or trees on the path to your home. Use bright outdoor lighting. Clear any walking paths of anything that might make someone trip, such as rocks or tools. Regularly check to see if handrails are loose or broken. Make sure that both sides of any steps have handrails. Any raised decks and porches should have guardrails on the edges. Have any leaves, snow, or ice cleared regularly. Use sand or salt on walking paths during winter. Clean up any spills in your garage right away. This includes oil or grease spills. What can I do in the bathroom? Use night lights. Install grab bars by the toilet and in the  tub and shower. Do not use towel bars as grab bars. Use non-skid mats or decals in the tub or shower. If you need to sit down in the shower, use a plastic, non-slip stool. Keep the floor dry. Clean up any water that spills on the floor as soon as it happens. Remove soap buildup in the tub or shower regularly. Attach bath mats securely with double-sided non-slip rug tape. Do not have throw rugs and other things on the floor that can make you trip. What can I do in the bedroom? Use night lights. Make sure that you have a light by your bed that is easy to reach. Do not use any sheets or blankets that are too big for your bed. They should not hang down onto the floor. Have a firm chair that has side arms. You can use this for support while you get dressed. Do not have throw rugs and other things on the floor that can make you trip. What can I do in the kitchen? Clean up any spills right away. Avoid walking on wet floors. Keep items that you use a lot in easy-to-reach places. If you need to reach something above you, use a strong step stool that has a grab bar. Keep electrical cords out of the way. Do not use floor polish or wax that makes floors slippery. If you must use wax, use non-skid floor wax. Do not have throw rugs and other things on the floor that can make you trip. What can I do with my stairs? Do not leave  any items on the stairs. Make sure that there are handrails on both sides of the stairs and use them. Fix handrails that are broken or loose. Make sure that handrails are as long as the stairways. Check any carpeting to make sure that it is firmly attached to the stairs. Fix any carpet that is loose or worn. Avoid having throw rugs at the top or bottom of the stairs. If you do have throw rugs, attach them to the floor with carpet tape. Make sure that you have a light switch at the top of the stairs and the bottom of the stairs. If you do not have them, ask someone to add them for  you. What else can I do to help prevent falls? Wear shoes that: Do not have high heels. Have rubber bottoms. Are comfortable and fit you well. Are closed at the toe. Do not wear sandals. If you use a stepladder: Make sure that it is fully opened. Do not climb a closed stepladder. Make sure that both sides of the stepladder are locked into place. Ask someone to hold it for you, if possible. Clearly mark and make sure that you can see: Any grab bars or handrails. First and last steps. Where the edge of each step is. Use tools that help you move around (mobility aids) if they are needed. These include: Canes. Walkers. Scooters. Crutches. Turn on the lights when you go into a dark area. Replace any light bulbs as soon as they burn out. Set up your furniture so you have a clear path. Avoid moving your furniture around. If any of your floors are uneven, fix them. If there are any pets around you, be aware of where they are. Review your medicines with your doctor. Some medicines can make you feel dizzy. This can increase your chance of falling. Ask your doctor what other things that you can do to help prevent falls. This information is not intended to replace advice given to you by your health care provider. Make sure you discuss any questions you have with your health care provider. Document Released: 01/04/2009 Document Revised: 08/16/2015 Document Reviewed: 04/14/2014 Elsevier Interactive Patient Education  2017 [employer].

## 2021-06-02 ENCOUNTER — Other Ambulatory Visit: Payer: Self-pay | Admitting: Internal Medicine

## 2021-06-02 DIAGNOSIS — E785 Hyperlipidemia, unspecified: Secondary | ICD-10-CM

## 2021-07-10 DIAGNOSIS — H1045 Other chronic allergic conjunctivitis: Secondary | ICD-10-CM | POA: Diagnosis not present

## 2021-07-10 DIAGNOSIS — H44523 Atrophy of globe, bilateral: Secondary | ICD-10-CM | POA: Diagnosis not present

## 2021-07-10 DIAGNOSIS — H01023 Squamous blepharitis right eye, unspecified eyelid: Secondary | ICD-10-CM | POA: Diagnosis not present

## 2021-07-10 DIAGNOSIS — H01026 Squamous blepharitis left eye, unspecified eyelid: Secondary | ICD-10-CM | POA: Diagnosis not present

## 2021-07-17 DIAGNOSIS — H1045 Other chronic allergic conjunctivitis: Secondary | ICD-10-CM | POA: Diagnosis not present

## 2021-08-02 DIAGNOSIS — Z1231 Encounter for screening mammogram for malignant neoplasm of breast: Secondary | ICD-10-CM | POA: Diagnosis not present

## 2021-08-02 LAB — HM MAMMOGRAPHY

## 2021-12-09 ENCOUNTER — Encounter: Payer: Self-pay | Admitting: Internal Medicine

## 2021-12-09 ENCOUNTER — Ambulatory Visit (INDEPENDENT_AMBULATORY_CARE_PROVIDER_SITE_OTHER): Payer: Medicare Other | Admitting: Internal Medicine

## 2021-12-09 VITALS — BP 124/82 | HR 72 | Temp 98.2°F | Ht 59.0 in | Wt 164.0 lb

## 2021-12-09 DIAGNOSIS — D510 Vitamin B12 deficiency anemia due to intrinsic factor deficiency: Secondary | ICD-10-CM

## 2021-12-09 DIAGNOSIS — I5032 Chronic diastolic (congestive) heart failure: Secondary | ICD-10-CM | POA: Diagnosis not present

## 2021-12-09 DIAGNOSIS — Z23 Encounter for immunization: Secondary | ICD-10-CM | POA: Diagnosis not present

## 2021-12-09 DIAGNOSIS — E559 Vitamin D deficiency, unspecified: Secondary | ICD-10-CM | POA: Diagnosis not present

## 2021-12-09 DIAGNOSIS — M1A0710 Idiopathic chronic gout, right ankle and foot, without tophus (tophi): Secondary | ICD-10-CM | POA: Diagnosis not present

## 2021-12-09 DIAGNOSIS — Z0000 Encounter for general adult medical examination without abnormal findings: Secondary | ICD-10-CM

## 2021-12-09 DIAGNOSIS — E785 Hyperlipidemia, unspecified: Secondary | ICD-10-CM

## 2021-12-09 DIAGNOSIS — N1832 Chronic kidney disease, stage 3b: Secondary | ICD-10-CM | POA: Diagnosis not present

## 2021-12-09 DIAGNOSIS — I10 Essential (primary) hypertension: Secondary | ICD-10-CM | POA: Diagnosis not present

## 2021-12-09 DIAGNOSIS — R7303 Prediabetes: Secondary | ICD-10-CM | POA: Diagnosis not present

## 2021-12-09 LAB — LIPID PANEL
Cholesterol: 190 mg/dL (ref 0–200)
HDL: 68.7 mg/dL (ref >39.00)
LDL Cholesterol: 101 mg/dL — ABNORMAL HIGH (ref 0–99)
NonHDL: 121.01
Total CHOL/HDL Ratio: 3
Triglycerides: 98 mg/dL (ref 0.0–149.0)
VLDL: 19.6 mg/dL (ref 0.0–40.0)

## 2021-12-09 LAB — URINALYSIS, ROUTINE W REFLEX MICROSCOPIC
Bilirubin Urine: NEGATIVE
Hgb urine dipstick: NEGATIVE
Ketones, ur: NEGATIVE
Nitrite: NEGATIVE
Specific Gravity, Urine: 1.02 (ref 1.000–1.030)
Total Protein, Urine: NEGATIVE
Urine Glucose: NEGATIVE
Urobilinogen, UA: 0.2 (ref 0.0–1.0)
pH: 6.5 (ref 5.0–8.0)

## 2021-12-09 LAB — BASIC METABOLIC PANEL
BUN: 11 mg/dL (ref 6–23)
CO2: 24 mEq/L (ref 19–32)
Calcium: 9.5 mg/dL (ref 8.4–10.5)
Chloride: 106 mEq/L (ref 96–112)
Creatinine, Ser: 1.01 mg/dL (ref 0.40–1.20)
GFR: 50.47 mL/min — ABNORMAL LOW (ref >60.00)
Glucose, Bld: 85 mg/dL (ref 70–99)
Potassium: 3.9 mEq/L (ref 3.5–5.1)
Sodium: 141 mEq/L (ref 135–145)

## 2021-12-09 LAB — HEPATIC FUNCTION PANEL
ALT: 8 U/L (ref 0–35)
AST: 16 U/L (ref 0–37)
Albumin: 4 g/dL (ref 3.5–5.2)
Alkaline Phosphatase: 73 U/L (ref 39–117)
Bilirubin, Direct: 0.1 mg/dL (ref 0.0–0.3)
Total Bilirubin: 0.5 mg/dL (ref 0.2–1.2)
Total Protein: 7.5 g/dL (ref 6.0–8.3)

## 2021-12-09 LAB — CBC WITH DIFFERENTIAL/PLATELET
Basophils Absolute: 0 10*3/uL (ref 0.0–0.1)
Basophils Relative: 0.4 % (ref 0.0–3.0)
Eosinophils Absolute: 0 10*3/uL (ref 0.0–0.7)
Eosinophils Relative: 0.8 % (ref 0.0–5.0)
HCT: 36.2 % (ref 36.0–46.0)
Hemoglobin: 11.9 g/dL — ABNORMAL LOW (ref 12.0–15.0)
Lymphocytes Relative: 31 % (ref 12.0–46.0)
Lymphs Abs: 1.5 10*3/uL (ref 0.7–4.0)
MCHC: 32.9 g/dL (ref 30.0–36.0)
MCV: 84.6 fl (ref 78.0–100.0)
Monocytes Absolute: 0.4 10*3/uL (ref 0.1–1.0)
Monocytes Relative: 7.3 % (ref 3.0–12.0)
Neutro Abs: 2.9 10*3/uL (ref 1.4–7.7)
Neutrophils Relative %: 60.5 % (ref 43.0–77.0)
Platelets: 201 10*3/uL (ref 150.0–400.0)
RBC: 4.29 Mil/uL (ref 3.87–5.11)
RDW: 14.9 % (ref 11.5–15.5)
WBC: 4.8 10*3/uL (ref 4.0–10.5)

## 2021-12-09 LAB — VITAMIN D 25 HYDROXY (VIT D DEFICIENCY, FRACTURES): VITD: 26.04 ng/mL — ABNORMAL LOW (ref 30.00–100.00)

## 2021-12-09 LAB — HEMOGLOBIN A1C: Hgb A1c MFr Bld: 6.2 % (ref 4.6–6.5)

## 2021-12-09 LAB — FOLATE: Folate: 8.4 ng/mL (ref >5.9)

## 2021-12-09 LAB — URIC ACID: Uric Acid, Serum: 4.8 mg/dL (ref 2.4–7.0)

## 2021-12-09 LAB — VITAMIN B12: Vitamin B-12: >1500 pg/mL — ABNORMAL HIGH (ref 211–911)

## 2021-12-09 LAB — TSH: TSH: 0.91 u[IU]/mL (ref 0.35–5.50)

## 2021-12-09 MED ORDER — SHINGRIX 50 MCG/0.5ML IM SUSR: 0.5000 mL | Freq: Once | INTRAMUSCULAR | 1 refills | Status: AC

## 2021-12-09 MED ORDER — PRAVASTATIN SODIUM 40 MG PO TABS: ORAL_TABLET | ORAL | 1 refills | Status: DC

## 2021-12-09 NOTE — Progress Notes (Unsigned)
Subjective:  Patient ID: [name], female    DOB: 1933/09/19  Age: 88 y.o. MRN: [id number]  CC: Annual Exam, Hypertension, Hyperlipidemia, and Anemia   HPI [name] presents for a CPX and f/up -   She has been taking Advil for eye pain.  She denies chest pain, shortness of breath, palpitations, or edema.  Outpatient Medications Prior to Visit  Medication Sig Dispense Refill   Cholecalciferol (VITAMIN D) 50 MCG (2000 UT) tablet TAKE 2 TABLETS (4,000 UNITS TOTAL) BY MOUTH DAILY. 180 tablet 1   levocetirizine (XYZAL) 5 MG tablet Take 1 tablet (5 mg total) by mouth every evening. 90 tablet 1   torsemide (DEMADEX) 10 MG tablet Take 1 tablet (10 mg total) by mouth as needed. 90 tablet 1   ibuprofen (ADVIL,MOTRIN) 200 MG tablet Take 200 mg by mouth every 6 (six) hours as needed.     pravastatin (PRAVACHOL) 40 MG tablet TAKE 1 TABLET BY MOUTH EVERYDAY AT BEDTIME 90 tablet 1   No facility-administered medications prior to visit.    ROS Review of Systems  Constitutional:  Negative for diaphoresis and fatigue.  HENT: Negative.    Eyes:  Positive for pain and visual disturbance.  Respiratory:  Negative for cough, chest tightness, shortness of breath and wheezing.   Cardiovascular:  Negative for chest pain, palpitations and leg swelling.  Gastrointestinal:  Negative for abdominal pain, diarrhea and nausea.  Endocrine: Negative.   Genitourinary: Negative.  Negative for difficulty urinating.  Musculoskeletal: Negative.  Negative for joint swelling and myalgias.  Skin: Negative.  Negative for color change.  Allergic/Immunologic: Negative.   Neurological: Negative.  Negative for dizziness, weakness and light-headedness.  Hematological:  Negative for adenopathy. Does not bruise/bleed easily.  Psychiatric/Behavioral: Negative.      Objective:  BP 124/82 (BP Location: Right Arm, Patient Position: Sitting, Cuff Size: Large)   Pulse 72   Temp 98.2 F (36.8 C) (Oral)   Ht 4'  11" (1.499 m)   Wt 164 lb (74.4 kg)   SpO2 98%   BMI 33.12 kg/m   BP Readings from Last 3 Encounters:  12/09/21 124/82  01/24/21 126/80  11/05/20 138/88    Wt Readings from Last 3 Encounters:  12/09/21 164 lb (74.4 kg)  01/24/21 157 lb 9.6 oz (71.5 kg)  11/05/20 161 lb 3.2 oz (73.1 kg)    Physical Exam Vitals reviewed.  Constitutional:      Appearance: She is not ill-appearing.  HENT:     Mouth/Throat:     Mouth: Mucous membranes are moist.  Eyes:     General: No scleral icterus. Cardiovascular:     Rate and Rhythm: Normal rate and regular rhythm.     Heart sounds: No murmur heard. Pulmonary:     Effort: Pulmonary effort is normal.     Breath sounds: No stridor. No wheezing, rhonchi or rales.  Abdominal:     General: Abdomen is flat.     Palpations: There is no mass.     Tenderness: There is no abdominal tenderness. There is no guarding.     Hernia: No hernia is present.  Musculoskeletal:        General: Normal range of motion.     Cervical back: Neck supple.     Right lower leg: No edema.     Left lower leg: No edema.  Lymphadenopathy:     Cervical: No cervical adenopathy.  Skin:    General: Skin is warm and dry.  Neurological:  General: No focal deficit present.     Mental Status: She is alert.  Psychiatric:        Mood and Affect: Mood normal.        Behavior: Behavior normal.     Lab Results  Component Value Date   WBC 4.8 12/09/2021   HGB 11.9 (L) 12/09/2021   HCT 36.2 12/09/2021   PLT 201.0 12/09/2021   GLUCOSE 85 12/09/2021   CHOL 190 12/09/2021   TRIG 98.0 12/09/2021   HDL 68.70 12/09/2021   LDLDIRECT 82.0 08/11/2017   LDLCALC 101 (H) 12/09/2021   ALT 8 12/09/2021   AST 16 12/09/2021   NA 141 12/09/2021   K 3.9 12/09/2021   CL 106 12/09/2021   CREATININE 1.01 12/09/2021   BUN 11 12/09/2021   CO2 24 12/09/2021   TSH 0.91 12/09/2021   HGBA1C 6.2 12/09/2021    CT ORBITS W CONTRAST  Result Date: 05/28/2020 CLINICAL DATA:  Left  orbital granuloma EXAM: CT ORBITS WITH CONTRAST TECHNIQUE: Multidetector CT images was performed according to the standard protocol following intravenous contrast administration. CONTRAST:  62m ISOVUE-300 IOPAMIDOL (ISOVUE-300) INJECTION 61% Creatinine was obtained on site [facility] [address] Results: Creatinine 0.8 mg/dL. COMPARISON:  None. FINDINGS: Orbits: Bilateral phthisis bulbi. Symmetric extraocular muscles and optic nerve sheath complexes. No intraorbital mass. Visualized sinuses: Aerated. Soft tissues: No apparent asymmetric left periorbital soft tissue swelling. Limited intracranial: No abnormal enhancement. IMPRESSION: Bilateral phthisis bulbi.  Otherwise unremarkable. Electronically Signed   By: PMacy MisM.D.   On: 05/28/2020 15:02    Assessment & Plan:   HShandrekawas seen today for annual exam, hypertension, hyperlipidemia and anemia.  Diagnoses and all orders for this visit:  Vitamin B12 deficiency anemia due to intrinsic factor deficiency- Her B12 level is too high. -     Vitamin B12; Future -     CBC with Differential/Platelet; Future -     Folate; Future -     Folate -     CBC with Differential/Platelet -     Vitamin B12  Routine health maintenance- Exam completed, labs reviewed, vaccines reviewed and updated, no cancer screenings indicated, patient education was given.  Prediabetes- Her A1c is 6.2%. -     Basic metabolic panel; Future -     Hemoglobin A1c; Future -     Hemoglobin A1c -     Basic metabolic panel  Hyperlipidemia with target LDL less than 130- LDL goal achieved. 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 -     Uric acid; Future -     Uric acid  Chronic heart failure with preserved ejection fraction [place]- She has a normal volume status.  Essential hypertension- Her blood pressure is adequately well controlled. -     Urinalysis, Routine w reflex microscopic; Future -     Hepatic  function panel; Future -     Hepatic function panel -     Urinalysis, Routine w reflex microscopic  Flu vaccine need -     Flu Vaccine QUAD High Dose(Fluad)  Need for prophylactic vaccination and inoculation against varicella -     Zoster Vaccine Adjuvanted [facility]) injection; Inject 0.5 mLs into the muscle once for 1 dose.  Stage 3b chronic kidney disease ([place])- Will discontinue the NSAIDs. -     Ambulatory referral to Nephrology  Other orders -     Pneumococcal polysaccharide vaccine 23-valent greater than or equal to 2yo subcutaneous/IM   I have discontinued Raelin L. Mcginnis's ibuprofen. I am also having her start on Shingrix. Additionally, I am having her maintain her Vitamin D, torsemide, levocetirizine, and pravastatin.  Meds ordered this encounter  Medications   Zoster Vaccine Adjuvanted [facility]) injection    Sig: Inject 0.5 mLs into the muscle once for 1 dose.    Dispense:  0.5 mL    Refill:  1   pravastatin (PRAVACHOL) 40 MG tablet    Sig: TAKE 1 TABLET BY MOUTH EVERYDAY AT BEDTIME    Dispense:  90 tablet    Refill:  1     Follow-up: No follow-ups on file.  Scarlette Calico, MD

## 2022-01-24 NOTE — Progress Notes (Unsigned)
Subjective:   [name] is a 88 y.o. female who presents for Medicare Annual (Subsequent) preventive examination. I connected with  [name] on 01/27/22 by a audio enabled telemedicine application and verified that I am speaking with the correct person using two identifiers.  Patient Location: Home  Provider Location: Home Office  I discussed the limitations of evaluation and management by telemedicine. The patient expressed understanding and agreed to proceed.  Review of Systems    Deferred to PCP Cardiac Risk Factors include: advanced age (>66mn, >>17women);dyslipidemia     Objective:    Today's Vitals   01/27/22 1053  PainSc: 1    There is no height or weight on file to calculate BMI.     01/27/2022   10:59 AM 01/24/2021   11:27 AM 02/22/2018    3:25 PM 07/30/2016   12:40 PM 07/10/2015    1:45 PM 05/09/2015   10:15 AM 05/01/2014    5:20 PM  Advanced Directives  Does Patient Have a Medical Advance Directive? No No No Yes Yes No No  Type of ATheatre stage managerof [place]Living will [place] will    Does patient want to make changes to medical advance directive?  No - Patient declined   No - Patient declined    Copy of [place]in Chart?    No - copy requested Yes    Would patient like information on creating a medical advance directive? No - Patient declined No - Patient declined Yes (ED - Information included in AVS)        Current Medications (verified) Outpatient Encounter Medications as of 01/27/2022  Medication Sig   Cholecalciferol (VITAMIN D) 50 MCG (2000 UT) tablet TAKE 2 TABLETS (4,000 UNITS TOTAL) BY MOUTH DAILY.   levocetirizine (XYZAL) 5 MG tablet Take 1 tablet (5 mg total) by mouth every evening.   pravastatin (PRAVACHOL) 40 MG tablet TAKE 1 TABLET BY MOUTH EVERYDAY AT BEDTIME   torsemide (DEMADEX) 10 MG tablet Take 1 tablet (10 mg total) by mouth as needed.   No  facility-administered encounter medications on file as of 01/27/2022.    Allergies (verified) Sulfa antibiotics   History: Past Medical History:  Diagnosis Date   Anemia    Arthritis    Blind    BLINDNESS, LEGAL, [place] 12/14/2006   Cancer ([place]    breast   CONSTIPATION 12/14/2006   Edema    EXTERNAL HEMORRHOIDS 09/30/2007   GERD 10/01/2007   HYPERLIPIDEMIA 12/14/2006   OOPHORECTOMY, RIGHT, HX OF 12/14/2006   OSTEOPENIA 02/07/2008   RECTAL BLEEDING 10/01/2007   ROTATOR CUFF REPAIR, RIGHT, HX OF 12/14/2006   VITAMIN D DEFICIENCY 01/25/2009   Past Surgical History:  Procedure Laterality Date   ABDOMINAL HYSTERECTOMY     IRRIGATION AND DEBRIDEMENT SEBACEOUS CYST  06/07   [name]   LE Art Doppler   '98   normal   Nuc Stress test   07/07   normal study   PARTIAL MASTECTOMY WITH NEEDLE LOCALIZATION Left 05/11/2013   Procedure: PARTIAL MASTECTOMY WITH NEEDLE LOCALIZATION;  Surgeon: HAdin Hector MD;  [facility]  Service: General;  Laterality: Left;   RIGHT OOPHORECTOMY     ROTATOR CUFF REPAIR     right   TUBAL LIGATION     Family History  Problem Relation Age of Onset   Hypertension Father    Dementia Father    Cancer Neg  Hx        Negative for breast or colon cancer   Heart disease Neg Hx    Social History   Socioeconomic History   Marital status: Widowed    Spouse name: Not on file   Number of children: 5   Years of education: 66   Highest education level: Not on file  Occupational History   Not on file  Tobacco Use   Smoking status: Never   Smokeless tobacco: Never  Vaping Use   Vaping Use: Never used  Substance and Sexual Activity   Alcohol use: No   Drug use: No   Sexual activity: Not Currently  Other Topics Concern   Not on file  Social History Narrative   HSG   Married '61   3 sons- '62, '64, '65; 2 daughters- '60, '66: 5 grandchildren   Lives with husband   Support family-close at hand         Social  Determinants of Radio broadcast assistant Strain: Justice  (01/27/2022)   Overall Financial Resource Strain (CARDIA)    Difficulty of Paying Living Expenses: Not hard at all  Food Insecurity: No Food Insecurity (01/27/2022)   Hunger Vital Sign    Worried About Running Out of Food in the Last Year: Never true    [place] in the Last Year: Never true  Transportation Needs: No Transportation Needs (01/27/2022)   PRAPARE - Hydrologist (Medical): No    Lack of Transportation (Non-Medical): No  Physical Activity: Inactive (01/27/2022)   Exercise Vital Sign    Days of Exercise per Week: 0 days    Minutes of Exercise per Session: 0 min  Stress: No Stress Concern Present (01/27/2022)   [place]    Feeling of Stress : Not at all  Social Connections: Moderately Isolated (01/27/2022)   Social Connection and Isolation Panel [NHANES]    Frequency of Communication with Friends and Family: More than three times a week    Frequency of Social Gatherings with Friends and Family: More than three times a week    Attends Religious Services: More than 4 times per year    Active Member of [employer] or Organizations: No    Attends Archivist Meetings: Never    Marital Status: Widowed    Tobacco Counseling Counseling given: Not Answered   Clinical Intake:  Pre-visit preparation completed: Yes  Pain : 0-10 Pain Score: 1  Pain Type: Chronic pain Pain Location: Generalized Pain Descriptors / Indicators: Aching Pain Relieving Factors: rest  Pain Relieving Factors: rest  Nutritional Status: BMI > 30  Obese Nutritional Risks: None Diabetes: No  How often do you need to have someone help you when you read instructions, pamphlets, or other written materials from your doctor or pharmacy?: 1 - Never What is the last grade level you completed in school?: 12th  Diabetic?No  Interpreter  Needed?: No  Information entered by :: Emelia Loron RN   Activities of Daily Living    01/27/2022   10:58 AM  In your present state of health, do you have any difficulty performing the following activities:  Hearing? 0  Vision? 1  Comment legally blind  Difficulty concentrating or making decisions? 0  Walking or climbing stairs? 0  Dressing or bathing? 0  Doing errands, shopping? 0  Preparing Food and eating ? N  Using the Toilet? N  In the  past six months, have you accidently leaked urine? N  Do you have problems with loss of bowel control? N  Managing your Medications? N  Managing your Finances? N  Housekeeping or managing your Housekeeping? N    Patient Care Team: Janith Lima, MD as PCP - General (Internal Medicine) Newton Pigg, MD as Consulting Physician (Obstetrics and Gynecology) Magrinat, Virgie Dad, MD (Inactive) as Consulting Physician (Oncology) Fanny Skates, MD as Consulting Physician (General Surgery) Gery Pray, MD as Consulting Physician (Radiation Oncology)  Indicate any recent Medical Services you may have received from other than [facility] providers in the past year (date may be approximate).     Assessment:   This is a routine wellness examination for [facility].  Hearing/Vision screen No results found.  Dietary issues and exercise activities discussed: Current Exercise Habits: The patient does not participate in regular exercise at present, Exercise limited by: orthopedic condition(s)   Goals Addressed             This Visit's Progress    Patient Stated       Stay independent as long as possible.      Depression Screen    01/27/2022   11:09 AM 01/24/2021   11:47 AM 11/05/2020    8:46 AM 11/02/2018    4:46 PM 02/22/2018    4:10 PM 08/11/2017    2:11 PM 07/30/2016   12:41 PM  PHQ 2/9 Scores  PHQ - 2 Score 0 0 0 0 1 0 0  PHQ- 9 Score   0  2      Fall Risk    01/24/2021   11:54 AM 11/05/2020    8:42 AM 11/02/2018    4:46 PM 02/22/2018     4:10 PM 08/11/2017    2:11 PM  [place] in the past year? 0 0 0 0 No  Number falls in past yr: 0 0 0    Injury with Fall? 0 0 0    Risk for fall due to : Impaired vision No Fall Risks Impaired vision Impaired vision   Follow up Falls evaluation completed Falls evaluation completed Education provided;Falls evaluation completed Falls prevention discussed     FALL RISK PREVENTION PERTAINING TO THE HOME:  Any stairs in or around the home? No  If so, are there any without handrails?  N/A Home free of loose throw rugs in walkways, pet beds, electrical cords, etc? Yes  Adequate lighting in your home to reduce risk of falls? Yes   ASSISTIVE DEVICES UTILIZED TO PREVENT FALLS:  Life alert? No  Use of a cane, walker or w/c? No  Grab bars in the bathroom? Yes  Shower chair or bench in shower? No  Elevated toilet seat or a handicapped toilet? No   Cognitive Function:    02/22/2018    4:11 PM  MMSE - Mini Mental State Exam  Not completed: Unable to complete        01/27/2022   11:00 AM  6CIT Screen  What Year? 0 points  What month? 0 points  What time? 0 points  Count back from 20 0 points  Months in reverse 0 points  Repeat phrase 0 points  Total Score 0 points    Immunizations Immunization History  Administered Date(s) Administered   Fluad Quad(high Dose 65+) 12/31/2018, 01/24/2021, 12/09/2021   Influenza, High Dose Seasonal PF 02/20/2016, 02/04/2017, 02/22/2018   PFIZER(Purple Top)SARS-COV-2 Vaccination 06/08/2019, 01/05/2020   Pfizer Covid-19 Vaccine Bivalent Booster 36yr & up  01/18/2021   Pneumococcal Conjugate-13 07/04/2014   Pneumococcal Polysaccharide-23 02/06/2009, 07/10/2015, 12/09/2021   Tdap 07/04/2014   Tetanus 06/30/2012    TDAP status: Up to date  Flu Vaccine status: Up to date  Pneumococcal vaccine status: Up to date  Covid-19 vaccine status: Information provided on how to obtain vaccines.   Qualifies for Shingles Vaccine? Yes   Zostavax  completed No   Shingrix Completed?: No.    Education has been provided regarding the importance of this vaccine. Patient has been advised to call insurance company to determine out of pocket expense if they have not yet received this vaccine. Advised may also receive vaccine at local pharmacy or Health Dept. Verbalized acceptance and understanding.  Screening Tests Health Maintenance  Topic Date Due   Zoster Vaccines- Shingrix (1 of 2) Never done   COVID-19 Vaccine (4 - Pfizer risk series) 03/15/2021   Medicare Annual Wellness (AWV)  01/28/2023   TETANUS/TDAP  07/03/2024   Pneumonia Vaccine 23+ Years old  Completed   INFLUENZA VACCINE  Completed   DEXA SCAN  Completed   HPV VACCINES  Aged Out    Health Maintenance  Health Maintenance Due  Topic Date Due   Zoster Vaccines- Shingrix (1 of 2) Never done   COVID-19 Vaccine (4 - Pfizer risk series) 03/15/2021    Colorectal cancer screening: No longer required.   Mammogram status: No longer required due to age.  Bone Density status: Completed 04/20/15. Results reflect: Bone density results: OSTEOPENIA. Repeat every completed once years.  Lung Cancer Screening: (Low Dose CT Chest recommended if Age 2-80 years, 30 pack-year currently smoking OR have quit w/in 15years.) does not qualify.   Additional Screening:  Hepatitis C Screening: does qualify; Completed education provided  Vision Screening: Recommended annual ophthalmology exams for early detection of glaucoma and other disorders of the eye. Is the patient up to date with their annual eye exam?  Yes  Who is the provider or what is the name of the office in which the patient attends annual eye exams? Dr. Lucianne Lei  If pt is not established with a provider, would they like to be referred to a provider to establish care?  N/A .   Dental Screening: Recommended annual dental exams for proper oral hygiene  Community Resource Referral / Chronic Care Management: CRR required this visit?  No    CCM required this visit?  No      Plan:     I have personally reviewed and noted the following in the patient's chart:   Medical and social history Use of alcohol, tobacco or illicit drugs  Current medications and supplements including opioid prescriptions. Patient is not currently taking opioid prescriptions. Functional ability and status Nutritional status Physical activity Advanced directives List of other physicians Hospitalizations, surgeries, and ER visits in previous 12 months Vitals Screenings to include cognitive, depression, and falls Referrals and appointments  In addition, I have reviewed and discussed with patient certain preventive protocols, quality metrics, and best practice recommendations. A written personalized care plan for preventive services as well as general preventive health recommendations were provided to patient.     Michiel Cowboy, RN   01/27/2022   Nurse Notes:  Ms. Gladue , Thank you for taking time to come for your Medicare Wellness Visit. I appreciate your ongoing commitment to your health goals. Please review the following plan we discussed and let me know if I can assist you in the future.   These are the goals we  discussed:  Goals      Patient Stated     Maintain current health status.     Patient Stated     Stay independent as long as possible.        This is a list of the screening recommended for you and due dates:  Health Maintenance  Topic Date Due   Zoster (Shingles) Vaccine (1 of 2) Never done   COVID-19 Vaccine (4 - Pfizer risk series) 03/15/2021   Medicare Annual Wellness Visit  01/28/2023   Tetanus Vaccine  07/03/2024   Pneumonia Vaccine  Completed   Flu Shot  Completed   DEXA scan (bone density measurement)  Completed   HPV Vaccine  Aged Out

## 2022-01-24 NOTE — Patient Instructions (Signed)

## 2022-01-27 ENCOUNTER — Ambulatory Visit (INDEPENDENT_AMBULATORY_CARE_PROVIDER_SITE_OTHER): Payer: Medicare Other | Admitting: *Deleted

## 2022-01-27 DIAGNOSIS — Z0000 Encounter for general adult medical examination without abnormal findings: Secondary | ICD-10-CM

## 2022-05-01 DIAGNOSIS — E785 Hyperlipidemia, unspecified: Secondary | ICD-10-CM | POA: Diagnosis not present

## 2022-05-01 DIAGNOSIS — N1832 Chronic kidney disease, stage 3b: Secondary | ICD-10-CM | POA: Diagnosis not present

## 2022-05-01 DIAGNOSIS — D631 Anemia in chronic kidney disease: Secondary | ICD-10-CM | POA: Diagnosis not present

## 2022-05-01 DIAGNOSIS — I129 Hypertensive chronic kidney disease with stage 1 through stage 4 chronic kidney disease, or unspecified chronic kidney disease: Secondary | ICD-10-CM | POA: Diagnosis not present

## 2022-05-01 DIAGNOSIS — N1831 Chronic kidney disease, stage 3a: Secondary | ICD-10-CM | POA: Diagnosis not present

## 2022-05-03 ENCOUNTER — Emergency Department (HOSPITAL_COMMUNITY)
Admission: EM | Admit: 2022-05-03 | Discharge: 2022-05-03 | Disposition: A | Discharge status: Home / Self Care | Payer: Medicare HMO | Attending: Emergency Medicine | Admitting: Emergency Medicine

## 2022-05-03 ENCOUNTER — Encounter (HOSPITAL_COMMUNITY): Payer: Self-pay

## 2022-05-03 DIAGNOSIS — H5712 Ocular pain, left eye: Secondary | ICD-10-CM | POA: Insufficient documentation

## 2022-05-03 MED ORDER — ERYTHROMYCIN 5 MG/GM OP OINT: TOPICAL_OINTMENT | Freq: Four times a day (QID) | OPHTHALMIC | 0 refills | Status: AC

## 2022-05-03 MED ORDER — HYDROCODONE-ACETAMINOPHEN 5-325 MG PO TABS
1.0000 | ORAL_TABLET | Freq: Once | ORAL | Status: AC
  Administered 2022-05-03: 1 via ORAL
  Filled 2022-05-03: qty 1

## 2022-05-03 NOTE — Discharge Instructions (Signed)
As we discussed, your workup in the ER today was reassuring for acute findings.  I suspect that your symptoms are a flare of your chronic eye problem.  I have given you a prescription for erythromycin ointment for management of this.  Please fill and take as prescribed.  I also recommend that you use warm compresses regularly for additional symptomatic management.  Please follow-up with your ophthalmologist at your earliest convenience for continued evaluation and management of the symptoms.  Return if development of any new or worsening symptoms.

## 2022-05-03 NOTE — ED Provider Notes (Signed)
[facility] Provider Note   CSN: [id number] Arrival date & time: 05/03/22  [id number]     History  Chief Complaint  Patient presents with   Eye Pain    [name] is a 89 y.o. female.  Patient with history of blindness in both eyes presents today with complaints of left eye pain. She states that same began 2 days ago and has been persistent since. She states that her eye is draining clear fluid and her upper lid is mildly swollen and painful. She has a history of similar symptoms that she has been managing with ophthalmology for the past 2 years, however she was unable to get in to see them today due to it being the weekend. She states that they normally give her some "drops and an ointment which helps." She is unsure what these medications are or what ophthalmology has diagnosed her with. She has been trying warm compresses with some relief. Denies fevers, chills, or headaches.  The history is provided by the patient. No language interpreter was used.  Eye Pain       Home Medications Prior to Admission medications   Medication Sig Start Date End Date Taking? Authorizing Provider  Cholecalciferol (VITAMIN D) 50 MCG (2000 UT) tablet TAKE 2 TABLETS (4,000 UNITS TOTAL) BY MOUTH DAILY. 11/03/19   Janith Lima, MD  levocetirizine (XYZAL) 5 MG tablet Take 1 tablet (5 mg total) by mouth every evening. 11/03/19   Janith Lima, MD  pravastatin (PRAVACHOL) 40 MG tablet TAKE 1 TABLET BY MOUTH EVERYDAY AT BEDTIME 12/09/21   Janith Lima, MD  torsemide (DEMADEX) 10 MG tablet Take 1 tablet (10 mg total) by mouth as needed. 11/03/19   Janith Lima, MD      Allergies    Sulfa antibiotics    Review of Systems   Review of Systems  Eyes:  Positive for pain.  All other systems reviewed and are negative.   Physical Exam Updated Vital Signs BP (!) 181/96 (BP Location: Left Arm)   Pulse 80   Temp 98.4 F (36.9 C) (Oral)   Resp 18    SpO2 95%  Physical Exam Vitals and nursing note reviewed.  Constitutional:      General: She is not in acute distress.    Appearance: Normal appearance. She is normal weight. She is not ill-appearing, toxic-appearing or diaphoretic.  HENT:     Head: Normocephalic and atraumatic.  Eyes:     Comments: Bilateral eye blindness. Clear tearing from the left eye with trace swelling to the upper lid and crusting along the eyelash line. No purulence. Unable to visualize conjunctiva or any part of the eye itself which family says is baseline. No significant periorbital swelling, chalazion, or hordeolum visualized.  Cardiovascular:     Rate and Rhythm: Normal rate.  Pulmonary:     Effort: Pulmonary effort is normal. No respiratory distress.  Musculoskeletal:        General: Normal range of motion.     Cervical back: Normal range of motion.  Skin:    General: Skin is warm and dry.  Neurological:     General: No focal deficit present.     Mental Status: She is alert.  Psychiatric:        Mood and Affect: Mood normal.        Behavior: Behavior normal.     ED Results / Procedures / Treatments   Labs (all labs ordered  are listed, but only abnormal results are displayed) Labs Reviewed - No data to display  EKG None  Radiology No results found.  Procedures Procedures    Medications Ordered in ED Medications - No data to display  ED Course/ Medical Decision Making/ A&P                             Medical Decision Making  Patient presents today with complaints of left eye pain x 2 days.  She is afebrile, nontoxic-appearing, and in no acute distress with reassuring vital signs. Patient states that this is a chronic issue that she is followed by ophthalmology for. They are unsure of her diagnosis or normal course of treatment. Chart review reveals it appears patient has been diagnosed with squamous blepharitis of bilateral eyes as well as chronic conjunctivitis. I am unable to review  treatment plan. Physical exam reveals some clear tearing from the left eye and trace upper lid swelling with crusting around the eyelashes. 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 Patients symptoms consistent with likely flare of chronic blepharitis. Given that she is blind in both eyes with severe atrophy, no indication to attempt staining or checking eye pressures at this time. Given she has failed warm compresses, will treat with erythromycin ointment as I suspect this is what she was given by her ophthalmologist based on families description of medication. Will defer steroid drops for when she is able to follow up with her doctor. No concern for orbital cellulitis. Patient is stable for discharge, educated on red flag symptoms that would prompt immediate return. Also emphasized importance of close ophthalmology follow-up. Patient and family are understanding and amenable with plan, patient discharged in stable condition.   This is a shared visit with supervising physician Dr. Francia Greaves who has independently evaluated patient & provided guidance in evaluation/management/disposition, in agreement with care    Final Clinical Impression(s) / ED Diagnoses Final diagnoses:  Left eye pain    Rx / DC Orders ED Discharge Orders          Ordered    erythromycin ophthalmic ointment  4 times daily        05/03/22 0950          An After Visit Summary was printed and given to the patient.     Nestor Lewandowsky 05/03/22 [id number]    Valarie Merino, MD 05/03/22 [phone]

## 2022-05-03 NOTE — ED Triage Notes (Signed)
Pt presents with c/o left eye pain and drainage that started a few days ago. Pt reports she is also blind in both eyes. Pt reports she has this issue every few months.

## 2022-05-05 DIAGNOSIS — H1045 Other chronic allergic conjunctivitis: Secondary | ICD-10-CM | POA: Diagnosis not present

## 2022-05-09 ENCOUNTER — Telehealth: Payer: Self-pay

## 2022-05-09 ENCOUNTER — Other Ambulatory Visit: Payer: Self-pay | Admitting: Internal Medicine

## 2022-05-09 DIAGNOSIS — I10 Essential (primary) hypertension: Secondary | ICD-10-CM

## 2022-05-09 DIAGNOSIS — R600 Localized edema: Secondary | ICD-10-CM

## 2022-05-09 DIAGNOSIS — H1045 Other chronic allergic conjunctivitis: Secondary | ICD-10-CM | POA: Diagnosis not present

## 2022-05-09 MED ORDER — TORSEMIDE 10 MG PO TABS: 10.0000 mg | ORAL_TABLET | ORAL | 1 refills | Status: DC | PRN

## 2022-05-09 NOTE — Telephone Encounter (Signed)
Pts daughter called and has stated pt has been experiencing HIGH bp readings since Saturday 05/03/2022 when pt went to the ER. Pts BP was 156/91 when last checked today 05/09/2022.  ** Pts daughter Peter Congo has asked that a BP med be sent in to help pt with the high readings here lately. I informed Peter Congo pt would have to be seen and she has asked that a note be sent to provider and a nurse give her a call back at [phone].

## 2022-06-08 ENCOUNTER — Other Ambulatory Visit: Payer: Self-pay | Admitting: Internal Medicine

## 2022-06-08 DIAGNOSIS — E785 Hyperlipidemia, unspecified: Secondary | ICD-10-CM

## 2022-08-15 DIAGNOSIS — Z1231 Encounter for screening mammogram for malignant neoplasm of breast: Secondary | ICD-10-CM | POA: Diagnosis not present

## 2022-08-15 LAB — HM MAMMOGRAPHY

## 2022-08-20 ENCOUNTER — Encounter: Payer: Self-pay | Admitting: Internal Medicine

## 2022-09-16 IMAGING — CT CT ORBITS W/ CM
1 series · 16 of 30 positions shown, 20 images · IV contrast (iopamidol)
Comparison: None.

CLINICAL DATA: Left orbital granuloma

EXAM:
CT ORBITS WITH CONTRAST
TECHNIQUE: Multidetector CT images was performed according to the standard
protocol following intravenous contrast administration.
CONTRAST:  75mL HOOQIE-H00 IOPAMIDOL (HOOQIE-H00) INJECTION 61%
Creatinine was obtained on site at [HOSPITAL] at [HOSPITAL].
Results: Creatinine 0.8 mg/dL.

[Series 4: orbits-soft · axial · 0.34mm/px · z∈[-168,-72]mm · 16 of 52 slices shown, 20 images]
[im 2/52  brain]
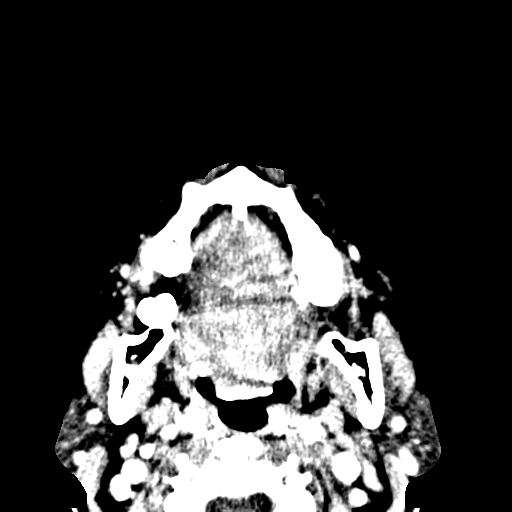
[im 2/52  bone]
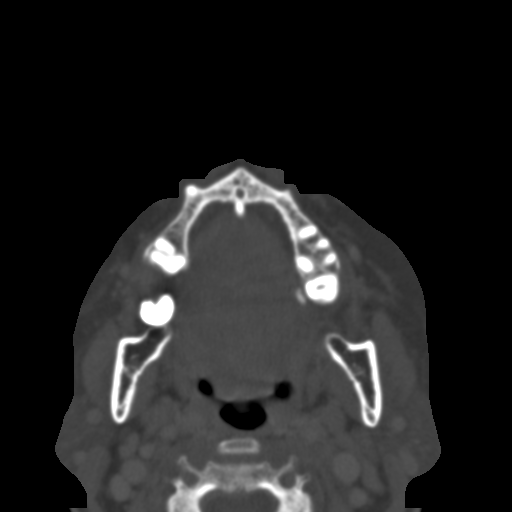
[im 6/52  bone]
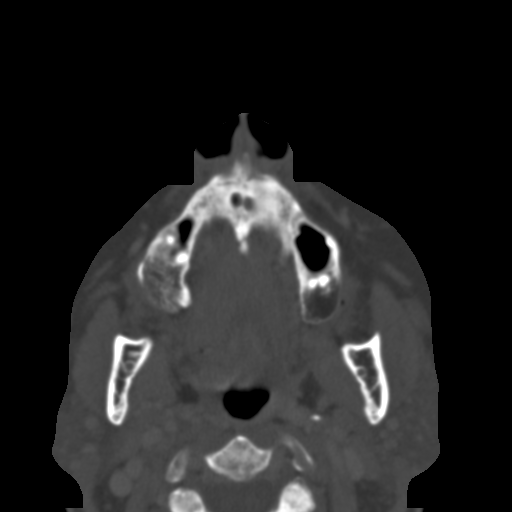
[im 9/52  bone]
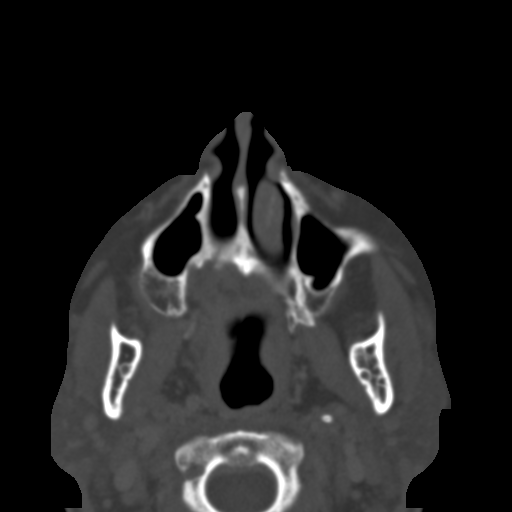
[im 13/52  bone]
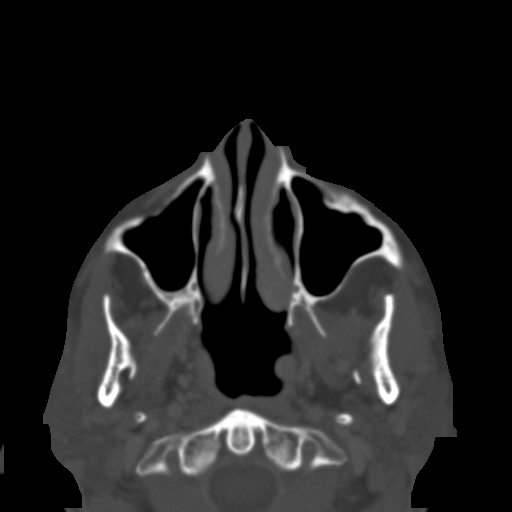
[im 15/52  brain]
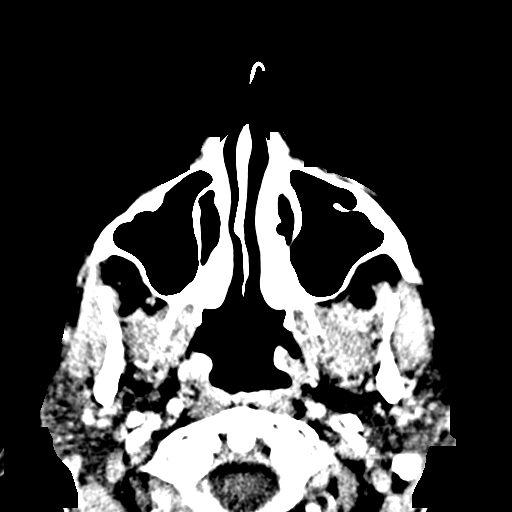
[im 15/52  bone]
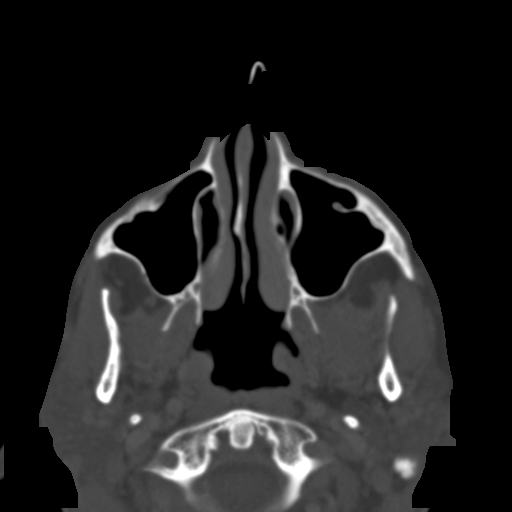
[im 18/52  bone]
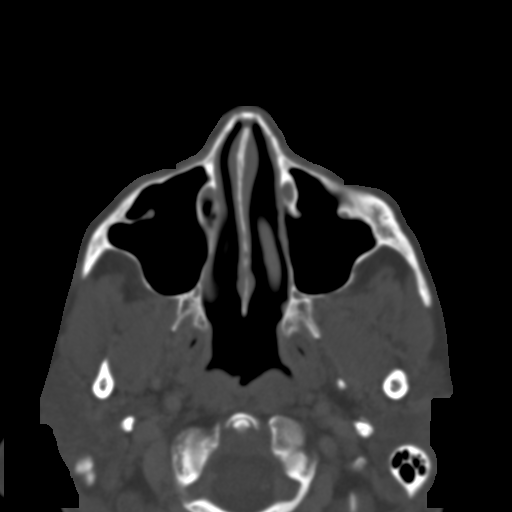
[im 22/52  bone]
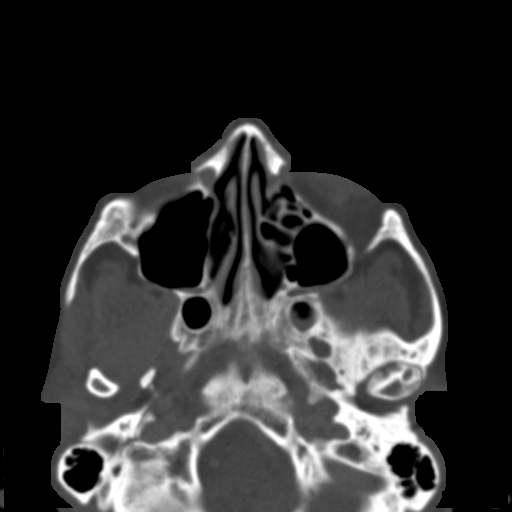
[im 25/52  bone]
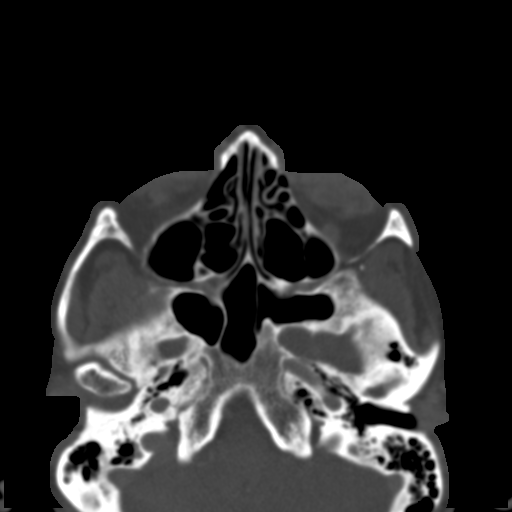
[im 27/52  brain]
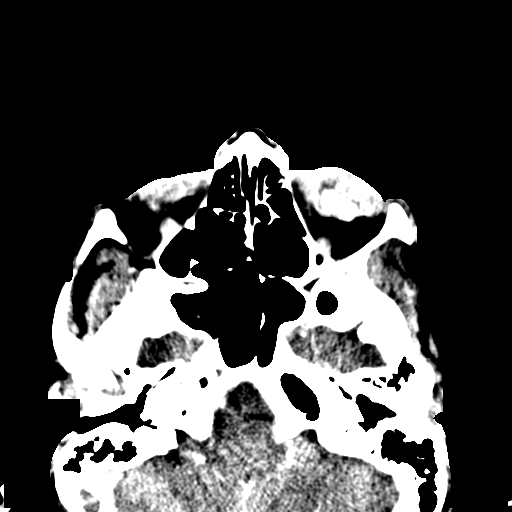
[im 27/52  bone]
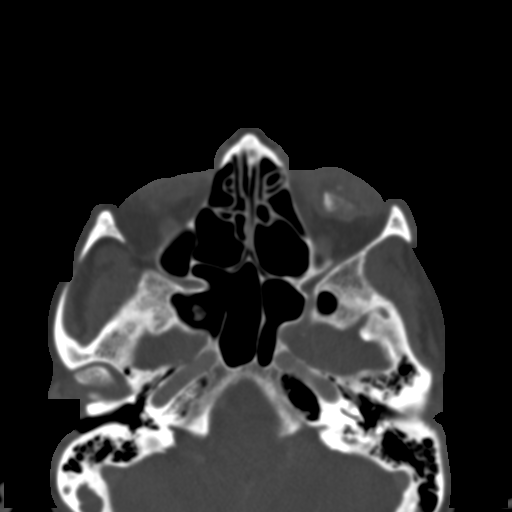
[im 30/52  bone]
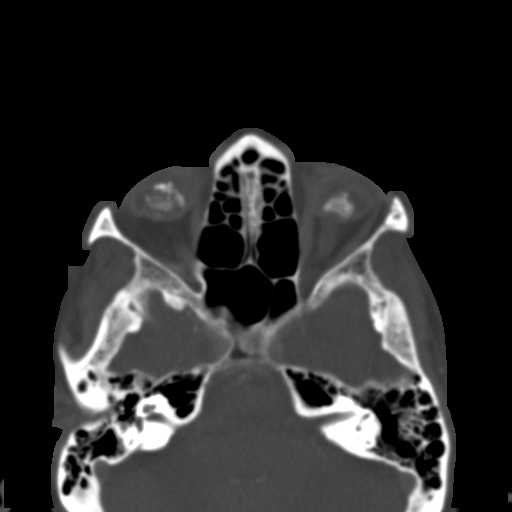
[im 34/52  bone]
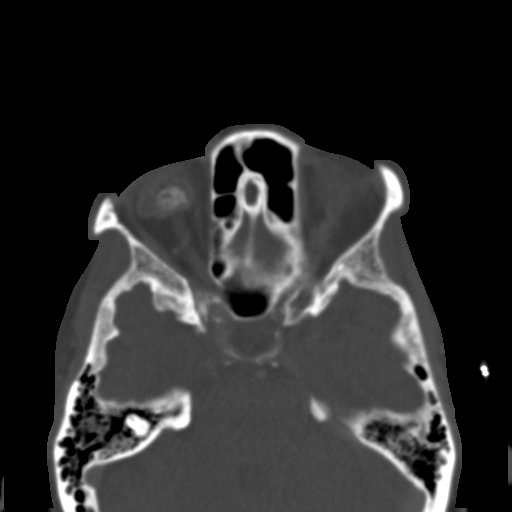
[im 37/52  bone]
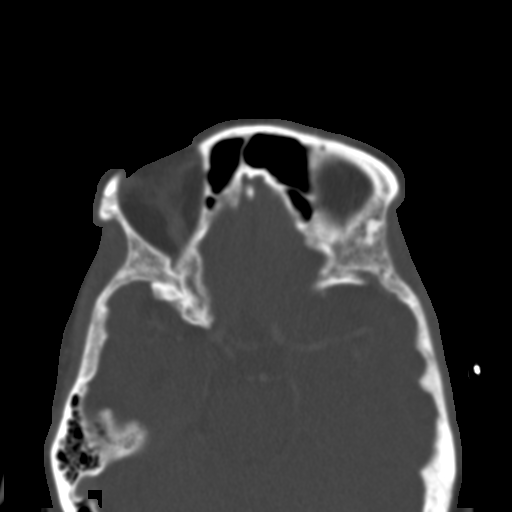
[im 39/52  brain]
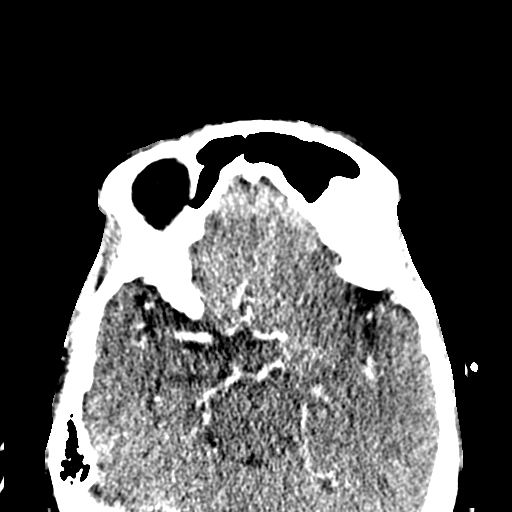
[im 39/52  bone]
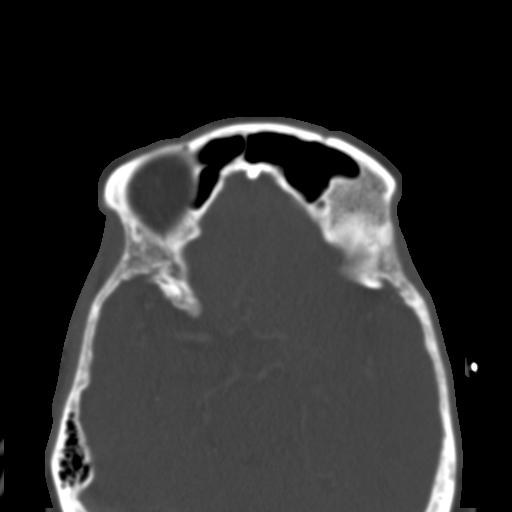
[im 43/52  bone]
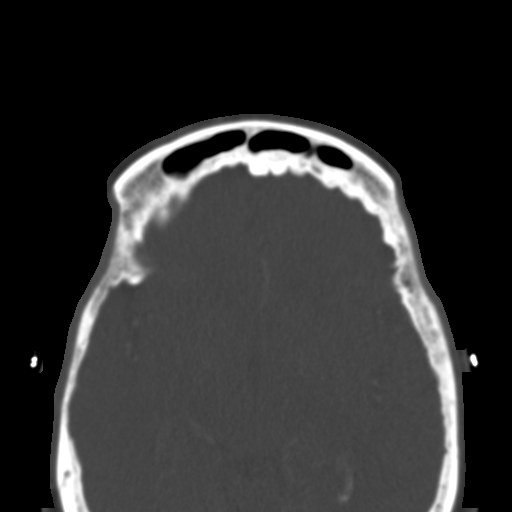
[im 46/52  bone]
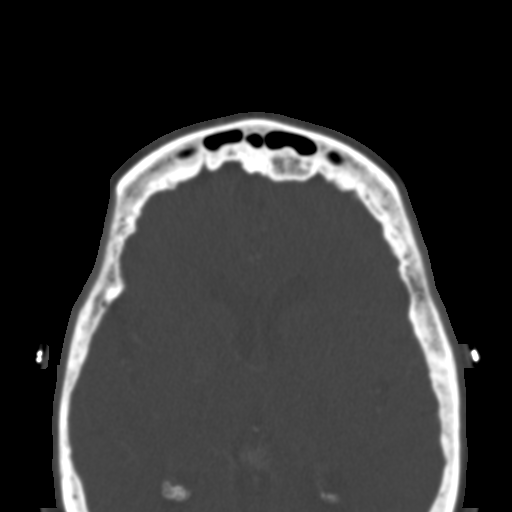
[im 50/52  bone]
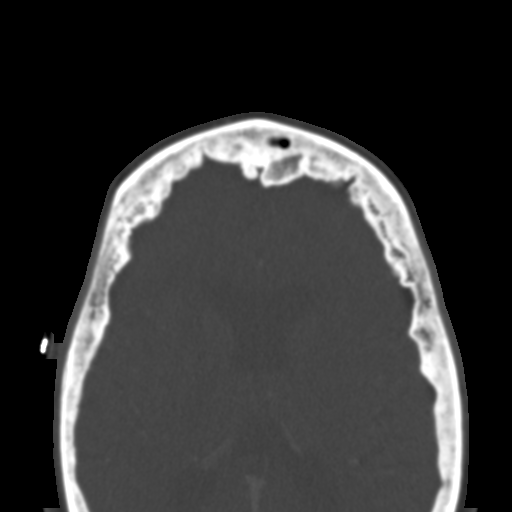

[16 of 30 positions shown; findings below may reference images not displayed]

FINDINGS: Orbits: Bilateral phthisis bulbi. Symmetric extraocular muscles and
optic nerve sheath complexes. No intraorbital mass.

Visualized sinuses: Aerated.

Soft tissues: No apparent asymmetric left periorbital soft tissue
swelling.

Limited intracranial: No abnormal enhancement.
IMPRESSION: Bilateral phthisis bulbi.  Otherwise unremarkable.

## 2022-12-05 ENCOUNTER — Other Ambulatory Visit: Payer: Self-pay | Admitting: Internal Medicine

## 2022-12-05 DIAGNOSIS — I10 Essential (primary) hypertension: Secondary | ICD-10-CM

## 2022-12-05 DIAGNOSIS — R600 Localized edema: Secondary | ICD-10-CM

## 2022-12-25 ENCOUNTER — Ambulatory Visit (INDEPENDENT_AMBULATORY_CARE_PROVIDER_SITE_OTHER): Payer: Medicare HMO | Admitting: Internal Medicine

## 2022-12-25 ENCOUNTER — Encounter: Payer: Self-pay | Admitting: Internal Medicine

## 2022-12-25 VITALS — BP 144/74 | HR 64 | Temp 98.2°F | Ht 59.0 in | Wt 156.0 lb

## 2022-12-25 DIAGNOSIS — R9431 Abnormal electrocardiogram [ECG] [EKG]: Secondary | ICD-10-CM | POA: Diagnosis not present

## 2022-12-25 DIAGNOSIS — N1832 Chronic kidney disease, stage 3b: Secondary | ICD-10-CM

## 2022-12-25 DIAGNOSIS — I5032 Chronic diastolic (congestive) heart failure: Secondary | ICD-10-CM | POA: Diagnosis not present

## 2022-12-25 DIAGNOSIS — E559 Vitamin D deficiency, unspecified: Secondary | ICD-10-CM

## 2022-12-25 DIAGNOSIS — E785 Hyperlipidemia, unspecified: Secondary | ICD-10-CM

## 2022-12-25 DIAGNOSIS — Z0001 Encounter for general adult medical examination with abnormal findings: Secondary | ICD-10-CM | POA: Insufficient documentation

## 2022-12-25 DIAGNOSIS — R7303 Prediabetes: Secondary | ICD-10-CM

## 2022-12-25 DIAGNOSIS — D510 Vitamin B12 deficiency anemia due to intrinsic factor deficiency: Secondary | ICD-10-CM

## 2022-12-25 DIAGNOSIS — Z0000 Encounter for general adult medical examination without abnormal findings: Secondary | ICD-10-CM | POA: Diagnosis not present

## 2022-12-25 DIAGNOSIS — I10 Essential (primary) hypertension: Secondary | ICD-10-CM | POA: Diagnosis not present

## 2022-12-25 DIAGNOSIS — Z23 Encounter for immunization: Secondary | ICD-10-CM

## 2022-12-25 LAB — CBC WITH DIFFERENTIAL/PLATELET
Basophils Absolute: 0 10*3/uL (ref 0.0–0.1)
Basophils Relative: 0.7 % (ref 0.0–3.0)
Eosinophils Absolute: 0 10*3/uL (ref 0.0–0.7)
Eosinophils Relative: 0.8 % (ref 0.0–5.0)
HCT: 36.5 % (ref 36.0–46.0)
Hemoglobin: 11.6 g/dL — ABNORMAL LOW (ref 12.0–15.0)
Lymphocytes Relative: 39.1 % (ref 12.0–46.0)
Lymphs Abs: 1.7 10*3/uL (ref 0.7–4.0)
MCHC: 31.7 g/dL (ref 30.0–36.0)
MCV: 85.7 fL (ref 78.0–100.0)
Monocytes Absolute: 0.3 10*3/uL (ref 0.1–1.0)
Monocytes Relative: 6.9 % (ref 3.0–12.0)
Neutro Abs: 2.3 10*3/uL (ref 1.4–7.7)
Neutrophils Relative %: 52.5 % (ref 43.0–77.0)
Platelets: 257 10*3/uL (ref 150.0–400.0)
RBC: 4.26 Mil/uL (ref 3.87–5.11)
RDW: 14.2 % (ref 11.5–15.5)
WBC: 4.4 10*3/uL (ref 4.0–10.5)

## 2022-12-25 LAB — BASIC METABOLIC PANEL
BUN: 13 mg/dL (ref 6–23)
CO2: 25 meq/L (ref 19–32)
Calcium: 9.2 mg/dL (ref 8.4–10.5)
Chloride: 104 meq/L (ref 96–112)
Creatinine, Ser: 1.03 mg/dL (ref 0.40–1.20)
GFR: 48.94 mL/min — ABNORMAL LOW (ref >60.00)
Glucose, Bld: 90 mg/dL (ref 70–99)
Potassium: 3.6 meq/L (ref 3.5–5.1)
Sodium: 138 meq/L (ref 135–145)

## 2022-12-25 LAB — URINALYSIS, ROUTINE W REFLEX MICROSCOPIC
Bilirubin Urine: NEGATIVE
Hgb urine dipstick: NEGATIVE
Ketones, ur: NEGATIVE
Nitrite: NEGATIVE
Specific Gravity, Urine: 1.01 (ref 1.000–1.030)
Total Protein, Urine: NEGATIVE
Urine Glucose: NEGATIVE
Urobilinogen, UA: 0.2 (ref 0.0–1.0)
pH: 6 (ref 5.0–8.0)

## 2022-12-25 LAB — HEPATIC FUNCTION PANEL
ALT: 10 U/L (ref 0–35)
AST: 20 U/L (ref 0–37)
Albumin: 4.1 g/dL (ref 3.5–5.2)
Alkaline Phosphatase: 68 U/L (ref 39–117)
Bilirubin, Direct: 0 mg/dL (ref 0.0–0.3)
Total Bilirubin: 0.6 mg/dL (ref 0.2–1.2)
Total Protein: 7.5 g/dL (ref 6.0–8.3)

## 2022-12-25 LAB — LIPID PANEL
Cholesterol: 227 mg/dL — ABNORMAL HIGH (ref 0–200)
HDL: 74.2 mg/dL (ref >39.00)
LDL Cholesterol: 131 mg/dL — ABNORMAL HIGH (ref 0–99)
NonHDL: 152.39
Total CHOL/HDL Ratio: 3
Triglycerides: 105 mg/dL (ref 0.0–149.0)
VLDL: 21 mg/dL (ref 0.0–40.0)

## 2022-12-25 LAB — TSH: TSH: 1.66 u[IU]/mL (ref 0.35–5.50)

## 2022-12-25 LAB — VITAMIN D 25 HYDROXY (VIT D DEFICIENCY, FRACTURES): VITD: 36.53 ng/mL (ref 30.00–100.00)

## 2022-12-25 LAB — FOLATE: Folate: 8.3 ng/mL (ref >5.9)

## 2022-12-25 LAB — BRAIN NATRIURETIC PEPTIDE: Pro B Natriuretic peptide (BNP): 55 pg/mL (ref 0.0–100.0)

## 2022-12-25 LAB — TROPONIN I (HIGH SENSITIVITY): High Sens Troponin I: 6 ng/L (ref 2–17)

## 2022-12-25 LAB — HEMOGLOBIN A1C: Hgb A1c MFr Bld: 6.1 % (ref 4.6–6.5)

## 2022-12-25 LAB — VITAMIN B12: Vitamin B-12: 267 pg/mL (ref 211–911)

## 2022-12-25 MED ORDER — SHINGRIX 50 MCG/0.5ML IM SUSR
0.5000 mL | Freq: Once | INTRAMUSCULAR | 1 refills | Status: AC
Start: 2022-12-25 — End: 2022-12-25

## 2022-12-25 NOTE — Progress Notes (Signed)
Subjective:  Patient ID: [name], female    DOB: 18-Apr-1933  Age: 89 y.o. MRN: [id number]  CC: Annual Exam, Hypertension, and Hyperlipidemia   HPI [name] presents for a CPX and f/up -----  Discussed the use of AI scribe software for clinical note transcription with the patient, who gave verbal consent to proceed.  History of Present Illness   The patient, with a history of B12 deficiency, presents with complaints of leg numbness and weakness. She was previously on B12 injections and then switched to oral supplements, but discontinued due to an unspecified issue. The patient denies chest pain, shortness of breath, and edema.  She also report pain radiating from the hip to the knee in one leg, but deny cramping in the calves. The patient denies any discomfort in the feet, despite long toenails. She also deny any issues with urination.       Outpatient Medications Prior to Visit  Medication Sig Dispense Refill   Cholecalciferol (VITAMIN D) 50 MCG (2000 UT) tablet TAKE 2 TABLETS (4,000 UNITS TOTAL) BY MOUTH DAILY. 180 tablet 1   erythromycin ophthalmic ointment Place into the left eye 4 (four) times daily. Place a 1/2 inch ribbon of ointment into the lower eyelid. 3.5 g 0   levocetirizine (XYZAL) 5 MG tablet Take 1 tablet (5 mg total) by mouth every evening. 90 tablet 1   pravastatin (PRAVACHOL) 40 MG tablet TAKE 1 TABLET BY MOUTH EVERYDAY AT BEDTIME 90 tablet 1   torsemide (DEMADEX) 10 MG tablet TAKE 1 TABLET BY MOUTH AS NEEDED. 90 tablet 0   No facility-administered medications prior to visit.    ROS Review of Systems  Constitutional:  Negative for chills, diaphoresis, fatigue and fever.  HENT: Negative.  Negative for trouble swallowing.   Eyes:  Positive for visual disturbance.  Respiratory: Negative.  Negative for chest tightness, shortness of breath and wheezing.   Cardiovascular:  Negative for chest pain, palpitations and leg swelling.   Gastrointestinal:  Negative for abdominal pain, constipation, diarrhea, nausea and vomiting.  Genitourinary: Negative.  Negative for difficulty urinating, dysuria and hematuria.  Musculoskeletal:  Positive for arthralgias and back pain. Negative for myalgias and neck pain.  Neurological: Negative.  Negative for dizziness and weakness.  Hematological:  Negative for adenopathy. Does not bruise/bleed easily.  Psychiatric/Behavioral:  Positive for confusion and decreased concentration. Negative for sleep disturbance. The patient is not nervous/anxious.     Objective:  BP (!) 144/74 (BP Location: Right Arm, Patient Position: Sitting, Cuff Size: Large)   Pulse 64   Temp 98.2 F (36.8 C) (Oral)   Ht 4\' 11"  (1.499 m)   Wt 156 lb (70.8 kg)   SpO2 96%   BMI 31.51 kg/m   BP Readings from Last 3 Encounters:  12/25/22 (!) 144/74  05/03/22 (!) 181/96  12/09/21 124/82    Wt Readings from Last 3 Encounters:  12/25/22 156 lb (70.8 kg)  12/09/21 164 lb (74.4 kg)  01/24/21 157 lb 9.6 oz (71.5 kg)    Physical Exam Vitals reviewed.  Constitutional:      General: She is not in acute distress.    Appearance: Normal appearance. She is not ill-appearing, toxic-appearing or diaphoretic.  HENT:     Nose: Nose normal.     Mouth/Throat:     Mouth: Mucous membranes are moist.  Eyes:     General: No scleral icterus.    Pupils: Pupils are equal, round, and reactive to light.  Cardiovascular:     Rate and Rhythm: Normal rate and regular rhythm.     Pulses: Normal pulses.     Heart sounds: S1 normal and S2 normal. Murmur heard.     Systolic murmur is present with a grade of 1/6.     No friction rub. No gallop.     Comments: EKG- NSR with SA, 76 bpm LAD Low voltage Anterolateral infarct pattern is new Pulmonary:     Effort: Pulmonary effort is normal.     Breath sounds: No stridor. No wheezing, rhonchi or rales.  Abdominal:     General: Abdomen is flat.     Palpations: There is no mass.      Tenderness: There is no abdominal tenderness. There is no guarding.     Hernia: No hernia is present.  Musculoskeletal:        General: No swelling.     Cervical back: Neck supple.     Right lower leg: No edema.     Left lower leg: No edema.  Skin:    General: Skin is warm and dry.  Neurological:     General: No focal deficit present.     Mental Status: She is alert. Mental status is at baseline.  Psychiatric:        Mood and Affect: Mood normal.        Behavior: Behavior normal.     Lab Results  Component Value Date   WBC 4.4 12/25/2022   HGB 11.6 (L) 12/25/2022   HCT 36.5 12/25/2022   PLT 257.0 12/25/2022   GLUCOSE 90 12/25/2022   CHOL 227 (H) 12/25/2022   TRIG 105.0 12/25/2022   HDL 74.20 12/25/2022   LDLDIRECT 82.0 08/11/2017   LDLCALC 131 (H) 12/25/2022   ALT 10 12/25/2022   AST 20 12/25/2022   NA 138 12/25/2022   K 3.6 12/25/2022   CL 104 12/25/2022   CREATININE 1.03 12/25/2022   BUN 13 12/25/2022   CO2 25 12/25/2022   TSH 1.66 12/25/2022   HGBA1C 6.1 12/25/2022    No results found.  Assessment & Plan:   Vitamin B12 deficiency anemia due to intrinsic factor deficiency- H/H are stable. B12/folate are normal. -     CBC with Differential/Platelet; Future -     Vitamin B12; Future -     Folate; Future  Stage 3b chronic kidney disease (HCC)- Renal function is stable -     Urinalysis, Routine w reflex microscopic; Future  Essential hypertension- BP is well controlled -     TSH; Future -     Urinalysis, Routine w reflex microscopic; Future -     EKG 12-Lead  Chronic heart failure with preserved ejection fraction (HCC)- Labs are reassuring. -     ECHOCARDIOGRAM COMPLETE; Future -     Brain natriuretic peptide; Future -     Troponin I (High Sensitivity); Future  Encounter for general adult medical examination with abnormal findings - Exam completed, labs reviewed, vaccines reviewed and updated, no cancer screenings indicated, pt ed material was given.    Prediabetes -     Hemoglobin A1c; Future -     Basic metabolic panel; Future  Hyperlipidemia with target LDL less than 130 -     Lipid panel; Future -     TSH; Future -     Hepatic function panel; Future  Vitamin D deficiency -     VITAMIN D 25 Hydroxy (Vit-D Deficiency, Fractures); Future  Abnormal electrocardiogram (ECG) (EKG) -  EKG 12-Lead -     ECHOCARDIOGRAM COMPLETE; Future -     Brain natriuretic peptide; Future -     Troponin I (High Sensitivity); Future  Need for prophylactic vaccination and inoculation against varicella -     Shingrix; Inject 0.5 mLs into the muscle once for 1 dose.  Dispense: 0.5 mL; Refill: 1     Follow-up: Return in about 6 months (around 06/25/2023).  Sanda Linger, MD

## 2022-12-25 NOTE — Patient Instructions (Signed)

## 2022-12-29 ENCOUNTER — Ambulatory Visit (HOSPITAL_COMMUNITY): Payer: Medicare HMO | Attending: Internal Medicine

## 2022-12-29 DIAGNOSIS — I5032 Chronic diastolic (congestive) heart failure: Secondary | ICD-10-CM

## 2022-12-29 DIAGNOSIS — R9431 Abnormal electrocardiogram [ECG] [EKG]: Secondary | ICD-10-CM

## 2022-12-29 LAB — ECHOCARDIOGRAM COMPLETE
Area-P 1/2: 2.97 cm2
S' Lateral: 2.3 cm

## 2023-03-04 ENCOUNTER — Other Ambulatory Visit: Payer: Self-pay | Admitting: Internal Medicine

## 2023-03-04 DIAGNOSIS — I10 Essential (primary) hypertension: Secondary | ICD-10-CM

## 2023-03-04 DIAGNOSIS — R600 Localized edema: Secondary | ICD-10-CM

## 2023-05-07 ENCOUNTER — Other Ambulatory Visit: Payer: Self-pay | Admitting: Internal Medicine

## 2023-05-07 DIAGNOSIS — E785 Hyperlipidemia, unspecified: Secondary | ICD-10-CM

## 2023-05-07 DIAGNOSIS — I10 Essential (primary) hypertension: Secondary | ICD-10-CM

## 2023-05-07 DIAGNOSIS — R600 Localized edema: Secondary | ICD-10-CM

## 2023-06-25 ENCOUNTER — Encounter: Payer: Self-pay | Admitting: Internal Medicine

## 2023-06-25 ENCOUNTER — Ambulatory Visit: Payer: Medicare HMO | Admitting: Internal Medicine

## 2023-06-25 VITALS — BP 142/76 | HR 67 | Temp 98.5°F | Resp 16 | Ht 59.0 in | Wt 160.6 lb

## 2023-06-25 DIAGNOSIS — J301 Allergic rhinitis due to pollen: Secondary | ICD-10-CM | POA: Diagnosis not present

## 2023-06-25 DIAGNOSIS — I10 Essential (primary) hypertension: Secondary | ICD-10-CM | POA: Diagnosis not present

## 2023-06-25 DIAGNOSIS — M1A0710 Idiopathic chronic gout, right ankle and foot, without tophus (tophi): Secondary | ICD-10-CM

## 2023-06-25 DIAGNOSIS — R7303 Prediabetes: Secondary | ICD-10-CM

## 2023-06-25 DIAGNOSIS — D510 Vitamin B12 deficiency anemia due to intrinsic factor deficiency: Secondary | ICD-10-CM

## 2023-06-25 DIAGNOSIS — N1832 Chronic kidney disease, stage 3b: Secondary | ICD-10-CM

## 2023-06-25 LAB — CBC WITH DIFFERENTIAL/PLATELET
Basophils Absolute: 0 10*3/uL (ref 0.0–0.1)
Basophils Relative: 0.5 % (ref 0.0–3.0)
Eosinophils Absolute: 0.1 10*3/uL (ref 0.0–0.7)
Eosinophils Relative: 1.8 % (ref 0.0–5.0)
HCT: 34.9 % — ABNORMAL LOW (ref 36.0–46.0)
Hemoglobin: 11.5 g/dL — ABNORMAL LOW (ref 12.0–15.0)
Lymphocytes Relative: 34.4 % (ref 12.0–46.0)
Lymphs Abs: 1.6 10*3/uL (ref 0.7–4.0)
MCHC: 32.9 g/dL (ref 30.0–36.0)
MCV: 84.8 fl (ref 78.0–100.0)
Monocytes Absolute: 0.4 10*3/uL (ref 0.1–1.0)
Monocytes Relative: 7.8 % (ref 3.0–12.0)
Neutro Abs: 2.5 10*3/uL (ref 1.4–7.7)
Neutrophils Relative %: 55.5 % (ref 43.0–77.0)
Platelets: 240 10*3/uL (ref 150.0–400.0)
RBC: 4.11 Mil/uL (ref 3.87–5.11)
RDW: 14.7 % (ref 11.5–15.5)
WBC: 4.5 10*3/uL (ref 4.0–10.5)

## 2023-06-25 LAB — BASIC METABOLIC PANEL WITH GFR
BUN: 13 mg/dL (ref 6–23)
CO2: 27 meq/L (ref 19–32)
Calcium: 9.3 mg/dL (ref 8.4–10.5)
Chloride: 107 meq/L (ref 96–112)
Creatinine, Ser: 0.98 mg/dL (ref 0.40–1.20)
GFR: 51.77 mL/min — ABNORMAL LOW (ref >60.00)
Glucose, Bld: 89 mg/dL (ref 70–99)
Potassium: 4.1 meq/L (ref 3.5–5.1)
Sodium: 141 meq/L (ref 135–145)

## 2023-06-25 LAB — VITAMIN B12: Vitamin B-12: 240 pg/mL (ref 211–911)

## 2023-06-25 LAB — URIC ACID: Uric Acid, Serum: 5.6 mg/dL (ref 2.4–7.0)

## 2023-06-25 LAB — HEMOGLOBIN A1C: Hgb A1c MFr Bld: 6.1 % (ref 4.6–6.5)

## 2023-06-25 LAB — FOLATE: Folate: 9.3 ng/mL (ref >5.9)

## 2023-06-25 NOTE — Progress Notes (Signed)
 Subjective:  Patient ID: [name], female    DOB: 30-Nov-1933  Age: 89 y.o. MRN: [id number]  CC: Hypertension and Allergic Rhinitis    HPI [name] presents for f/up ----  Discussed the use of AI scribe software for clinical note transcription with the patient, who gave verbal consent to proceed.  History of Present Illness   [name] is an 89 year old female with hypertension who presents for a follow-up visit.  Her blood pressure was recorded at 160/80 during today's visit. She did not experience any symptoms such as headache, chest pain, or shortness of breath. She mentions that her blood pressure was lower the previous night, around 128/84 or 128/86. No swelling in her legs or feet was noted.  She describes a problem with postnasal drip. It is not a cough but rather mucus from her nose that drains into her throat, causing her to clear her throat frequently. She is uncertain about her current use of antihistamines like Xyzal or cetirizine.  She has a history of prediabetes, and her blood sugar levels are being monitored.  She has gained four pounds since October, attributing this to holiday weight gain.       Outpatient Medications Prior to Visit  Medication Sig Dispense Refill   Cholecalciferol (VITAMIN D) 50 MCG (2000 UT) tablet TAKE 2 TABLETS (4,000 UNITS TOTAL) BY MOUTH DAILY. 180 tablet 1   erythromycin ophthalmic ointment Place into the left eye 4 (four) times daily. Place a 1/2 inch ribbon of ointment into the lower eyelid. 3.5 g 0   levocetirizine (XYZAL) 5 MG tablet Take 1 tablet (5 mg total) by mouth every evening. 90 tablet 1   pravastatin (PRAVACHOL) 40 MG tablet TAKE 1 TABLET BY MOUTH EVERYDAY AT BEDTIME 90 tablet 1   torsemide (DEMADEX) 10 MG tablet TAKE 1 TABLET BY MOUTH AS NEEDED. 90 tablet 0   No facility-administered medications prior to visit.    ROS Review of Systems  Constitutional: Negative.  Negative for chills, fatigue and  unexpected weight change.  HENT:  Positive for postnasal drip and rhinorrhea. Negative for congestion, facial swelling, sinus pressure, sore throat and trouble swallowing.   Eyes:  Negative for visual disturbance.  Respiratory:  Negative for cough, chest tightness, shortness of breath and wheezing.   Cardiovascular:  Negative for chest pain, palpitations and leg swelling.  Gastrointestinal: Negative.  Negative for abdominal pain, constipation, diarrhea, nausea and vomiting.  Endocrine: Negative.   Genitourinary: Negative.  Negative for difficulty urinating.  Musculoskeletal: Negative.  Negative for arthralgias and myalgias.  Skin: Negative.   Neurological:  Negative for dizziness and weakness.  Hematological:  Negative for adenopathy. Does not bruise/bleed easily.  Psychiatric/Behavioral:  Positive for confusion and decreased concentration. Negative for sleep disturbance.     Objective:  BP (!) 142/76 (BP Location: Left Arm, Patient Position: Sitting, Cuff Size: Normal)   Pulse 67   Temp 98.5 F (36.9 C) (Oral)   Resp 16   Ht 4\' 11"  (1.499 m)   Wt 160 lb 9.6 oz (72.8 kg)   SpO2 99%   BMI 32.44 kg/m   BP Readings from Last 3 Encounters:  06/25/23 (!) 142/76  12/25/22 (!) 144/74  05/03/22 (!) 181/96    Wt Readings from Last 3 Encounters:  06/25/23 160 lb 9.6 oz (72.8 kg)  12/25/22 156 lb (70.8 kg)  12/09/21 164 lb (74.4 kg)    Physical Exam Vitals reviewed.  Constitutional:  Appearance: Normal appearance.  HENT:     Nose: Nose normal.     Mouth/Throat:     Mouth: Mucous membranes are moist.  Eyes:     General: No scleral icterus.    Pupils: Pupils are equal, round, and reactive to light.  Cardiovascular:     Rate and Rhythm: Normal rate and regular rhythm.     Heart sounds: No murmur heard.    No friction rub. No gallop.  Pulmonary:     Effort: Pulmonary effort is normal.     Breath sounds: No stridor. No wheezing, rhonchi or rales.  Abdominal:     General:  Abdomen is flat.     Palpations: There is no mass.     Tenderness: There is no abdominal tenderness. There is no guarding.     Hernia: No hernia is present.  Musculoskeletal:        General: Normal range of motion.     Cervical back: Neck supple.     Right lower leg: No edema.     Left lower leg: No edema.  Lymphadenopathy:     Cervical: No cervical adenopathy.  Skin:    General: Skin is warm and dry.  Neurological:     General: No focal deficit present.     Mental Status: She is alert.  Psychiatric:        Mood and Affect: Mood normal.        Behavior: Behavior normal.     Lab Results  Component Value Date   WBC 4.5 06/25/2023   HGB 11.5 (L) 06/25/2023   HCT 34.9 (L) 06/25/2023   PLT 240.0 06/25/2023   GLUCOSE 89 06/25/2023   CHOL 227 (H) 12/25/2022   TRIG 105.0 12/25/2022   HDL 74.20 12/25/2022   LDLDIRECT 82.0 08/11/2017   LDLCALC 131 (H) 12/25/2022   ALT 10 12/25/2022   AST 20 12/25/2022   NA 141 06/25/2023   K 4.1 06/25/2023   CL 107 06/25/2023   CREATININE 0.98 06/25/2023   BUN 13 06/25/2023   CO2 27 06/25/2023   TSH 1.66 12/25/2022   HGBA1C 6.1 06/25/2023    No results found.  Assessment & Plan:   Essential hypertension- BP is well controlled. -     Basic metabolic panel with GFR; Future -     Torsemide; Take 1 tablet (10 mg total) by mouth as needed.  Dispense: 90 tablet; Refill: 1  Non-seasonal allergic rhinitis due to pollen  Vitamin B12 deficiency anemia due to intrinsic factor deficiency -     CBC with Differential/Platelet; Future -     Folate; Future -     Vitamin B12; Future  Idiopathic chronic gout of right ankle without tophus -     Basic metabolic panel with GFR; Future -     Uric acid; Future  Prediabetes -     Hemoglobin A1c; Future -     Basic metabolic panel with GFR; Future  Stage 3b chronic kidney disease (HCC)- Will avoid nephrotoxic agents      Follow-up: Return in about 6 months (around 12/25/2023).  Danielle Linger,  MD

## 2023-06-25 NOTE — Patient Instructions (Signed)
 Hypertension, Adult High blood pressure (hypertension) is when the force of blood pumping through the arteries is too strong. The arteries are the blood vessels that carry blood from the heart throughout the body. Hypertension forces the heart to work harder to pump blood and may cause arteries to become narrow or stiff. Untreated or uncontrolled hypertension can lead to a heart attack, heart failure, a stroke, kidney disease, and other problems. A blood pressure reading consists of a higher number over a lower number. Ideally, your blood pressure should be below 120/80. The first ("top") number is called the systolic pressure. It is a measure of the pressure in your arteries as your heart beats. The second ("bottom") number is called the diastolic pressure. It is a measure of the pressure in your arteries as the heart relaxes. What are the causes? The exact cause of this condition is not known. There are some conditions that result in high blood pressure. What increases the risk? Certain factors may make you more likely to develop high blood pressure. Some of these risk factors are under your control, including: Smoking. Not getting enough exercise or physical activity. Being overweight. Having too much fat, sugar, calories, or salt (sodium) in your diet. Drinking too much alcohol. Other risk factors include: Having a personal history of heart disease, diabetes, high cholesterol, or kidney disease. Stress. Having a family history of high blood pressure and high cholesterol. Having obstructive sleep apnea. Age. The risk increases with age. What are the signs or symptoms? High blood pressure may not cause symptoms. Very high blood pressure (hypertensive crisis) may cause: Headache. Fast or irregular heartbeats (palpitations). Shortness of breath. Nosebleed. Nausea and vomiting. Vision changes. Severe chest pain, dizziness, and seizures. How is this diagnosed? This condition is diagnosed by  measuring your blood pressure while you are seated, with your arm resting on a flat surface, your legs uncrossed, and your feet flat on the floor. The cuff of the blood pressure monitor will be placed directly against the skin of your upper arm at the level of your heart. Blood pressure should be measured at least twice using the same arm. Certain conditions can cause a difference in blood pressure between your right and left arms. If you have a high blood pressure reading during one visit or you have normal blood pressure with other risk factors, you may be asked to: Return on a different day to have your blood pressure checked again. Monitor your blood pressure at home for 1 week or longer. If you are diagnosed with hypertension, you may have other blood or imaging tests to help your health care provider understand your overall risk for other conditions. How is this treated? This condition is treated by making healthy lifestyle changes, such as eating healthy foods, exercising more, and reducing your alcohol intake. You may be referred for counseling on a healthy diet and physical activity. Your health care provider may prescribe medicine if lifestyle changes are not enough to get your blood pressure under control and if: Your systolic blood pressure is above 130. Your diastolic blood pressure is above 80. Your personal target blood pressure may vary depending on your medical conditions, your age, and other factors. Follow these instructions at home: Eating and drinking  Eat a diet that is high in fiber and potassium, and low in sodium, added sugar, and fat. An example of this eating plan is called the DASH diet. DASH stands for Dietary Approaches to Stop Hypertension. To eat this way: Eat  plenty of fresh fruits and vegetables. Try to fill one half of your plate at each meal with fruits and vegetables. Eat whole grains, such as whole-wheat pasta, brown rice, or whole-grain bread. Fill about one  fourth of your plate with whole grains. Eat or drink low-fat dairy products, such as skim milk or low-fat yogurt. Avoid fatty cuts of meat, processed or cured meats, and poultry with skin. Fill about one fourth of your plate with lean proteins, such as fish, chicken without skin, beans, eggs, or tofu. Avoid pre-made and processed foods. These tend to be higher in sodium, added sugar, and fat. Reduce your daily sodium intake. Many people with hypertension should eat less than 1,500 mg of sodium a day. Do not drink alcohol if: Your health care provider tells you not to drink. You are pregnant, may be pregnant, or are planning to become pregnant. If you drink alcohol: Limit how much you have to: 0-1 drink a day for women. 0-2 drinks a day for men. Know how much alcohol is in your drink. In the [place] one drink equals one 12 oz bottle of beer (355 mL), one 5 oz glass of wine (148 mL), or one 1 oz glass of hard liquor (44 mL). Lifestyle  Work with your health care provider to maintain a healthy body weight or to lose weight. Ask what an ideal weight is for you. Get at least 30 minutes of exercise that causes your heart to beat faster (aerobic exercise) most days of the week. Activities may include walking, swimming, or biking. Include exercise to strengthen your muscles (resistance exercise), such as Pilates or lifting weights, as part of your weekly exercise routine. Try to do these types of exercises for 30 minutes at least 3 days a week. Do not use any products that contain nicotine or tobacco. These products include cigarettes, chewing tobacco, and vaping devices, such as e-cigarettes. If you need help quitting, ask your health care provider. Monitor your blood pressure at home as told by your health care provider. Keep all follow-up visits. This is important. Medicines Take over-the-counter and prescription medicines only as told by your health care provider. Follow directions carefully. Blood  pressure medicines must be taken as prescribed. Do not skip doses of blood pressure medicine. Doing this puts you at risk for problems and can make the medicine less effective. Ask your health care provider about side effects or reactions to medicines that you should watch for. Contact a health care provider if you: Think you are having a reaction to a medicine you are taking. Have headaches that keep coming back (recurring). Feel dizzy. Have swelling in your ankles. Have trouble with your vision. Get help right away if you: Develop a severe headache or confusion. Have unusual weakness or numbness. Feel faint. Have severe pain in your chest or abdomen. Vomit repeatedly. Have trouble breathing. These symptoms may be an emergency. Get help right away. Call 911. Do not wait to see if the symptoms will go away. Do not drive yourself to the hospital. Summary Hypertension is when the force of blood pumping through your arteries is too strong. If this condition is not controlled, it may put you at risk for serious complications. Your personal target blood pressure may vary depending on your medical conditions, your age, and other factors. For most people, a normal blood pressure is less than 120/80. Hypertension is treated with lifestyle changes, medicines, or a combination of both. Lifestyle changes include losing weight, eating a healthy,  low-sodium diet, exercising more, and limiting alcohol. This information is not intended to replace advice given to you by your health care provider. Make sure you discuss any questions you have with your health care provider. Document Revised: 01/15/2021 Document Reviewed: 01/15/2021 Elsevier Patient Education  2024 [employer].

## 2023-06-27 MED ORDER — TORSEMIDE 10 MG PO TABS: 10.0000 mg | ORAL_TABLET | ORAL | 1 refills | Status: AC | PRN

## 2023-08-22 DIAGNOSIS — Z1231 Encounter for screening mammogram for malignant neoplasm of breast: Secondary | ICD-10-CM | POA: Diagnosis not present

## 2023-08-22 LAB — HM MAMMOGRAPHY

## 2023-08-25 ENCOUNTER — Encounter: Payer: Self-pay | Admitting: Internal Medicine

## 2023-09-29 ENCOUNTER — Ambulatory Visit (INDEPENDENT_AMBULATORY_CARE_PROVIDER_SITE_OTHER)

## 2023-09-29 VITALS — Ht 59.0 in | Wt 160.0 lb

## 2023-09-29 DIAGNOSIS — M8580 Other specified disorders of bone density and structure, unspecified site: Secondary | ICD-10-CM

## 2023-09-29 DIAGNOSIS — Z0000 Encounter for general adult medical examination without abnormal findings: Secondary | ICD-10-CM | POA: Diagnosis not present

## 2023-09-29 NOTE — Progress Notes (Signed)
 Subjective:   [name] is a 89 y.o. who presents for a Medicare Wellness preventive visit.  As a reminder, Annual Wellness Visits don't include a physical exam, and some assessments may be limited, especially if this visit is performed virtually. We may recommend an in-person follow-up visit with your provider if needed.  Visit Complete: Virtual I connected with  [name] on 09/29/23 by a audio enabled telemedicine application and verified that I am speaking with the correct person using two identifiers.  Patient Location: Home  Provider Location: Home Office  I discussed the limitations of evaluation and management by telemedicine. The patient expressed understanding and agreed to proceed.  Vital Signs: Because this visit was a virtual/telehealth visit, some criteria may be missing or patient reported. Any vitals not documented were not able to be obtained and vitals that have been documented are patient reported.  Persons Participating in Visit: Patient assisted by daughter.  AWV Questionnaire: Yes: Patient Medicare AWV questionnaire was completed by the patient on 09/29/2023; I have confirmed that all information answered by patient is correct and no changes since this date.  Cardiac Risk Factors include: advanced age (>74men, >49 women);hypertension;dyslipidemia;Other (see comment), Risk factor comments: CKD stage 3b, CHF     Objective:    Today's Vitals   09/29/23 1453  Weight: 160 lb (72.6 kg)  Height: 4' 11 (1.499 m)   Body mass index is 32.32 kg/m.     09/29/2023    3:02 PM 05/03/2022    9:11 AM 01/27/2022   10:59 AM 01/24/2021   11:27 AM 02/22/2018    3:25 PM 07/30/2016   12:40 PM 07/10/2015    1:45 PM  Advanced Directives  Does Patient Have a Medical Advance Directive? Yes No No No No  Yes  Yes   Type of Therapist, art of [place];Living will Healthcare Power of [place];Living will   Does patient  want to make changes to medical advance directive?    No - Patient declined   No - Patient declined   Copy of Healthcare Power of Attorney in Chart?      No - copy requested  Yes   Would patient like information on creating a medical advance directive?  No - Patient declined No - Patient declined No - Patient declined Yes (ED - Information included in AVS)        Data saved with a previous flowsheet row definition    Current Medications (verified) Outpatient Encounter Medications as of 09/29/2023  Medication Sig   Cholecalciferol  (VITAMIN D ) 50 MCG (2000 UT) tablet TAKE 2 TABLETS (4,000 UNITS TOTAL) BY MOUTH DAILY.   erythromycin  ophthalmic ointment Place into the left eye 4 (four) times daily. Place a 1/2 inch ribbon of ointment into the lower eyelid.   levocetirizine (XYZAL ) 5 MG tablet Take 1 tablet (5 mg total) by mouth every evening.   pravastatin  (PRAVACHOL ) 40 MG tablet TAKE 1 TABLET BY MOUTH EVERYDAY AT BEDTIME   torsemide  (DEMADEX ) 10 MG tablet Take 1 tablet (10 mg total) by mouth as needed.   No facility-administered encounter medications on file as of 09/29/2023.    Allergies (verified) Sulfa antibiotics   History: Past Medical History:  Diagnosis Date   Anemia    Arthritis    Blind    BLINDNESS, LEGAL, [place]  DEFINITION 12/14/2006   Cancer (HCC)    breast   CONSTIPATION 12/14/2006   Edema  EXTERNAL HEMORRHOIDS 09/30/2007   GERD 10/01/2007   HYPERLIPIDEMIA 12/14/2006   OOPHORECTOMY, RIGHT, HX OF 12/14/2006   OSTEOPENIA 02/07/2008   RECTAL BLEEDING 10/01/2007   ROTATOR CUFF REPAIR, RIGHT, HX OF 12/14/2006   VITAMIN D  DEFICIENCY 01/25/2009   Past Surgical History:  Procedure Laterality Date   ABDOMINAL HYSTERECTOMY     IRRIGATION AND DEBRIDEMENT SEBACEOUS CYST  06/07   [name]   LE Art Doppler   '98   normal   Nuc Stress test   07/07   normal study   PARTIAL MASTECTOMY WITH NEEDLE LOCALIZATION Left 05/11/2013   Procedure: PARTIAL MASTECTOMY WITH NEEDLE LOCALIZATION;   Surgeon: Elon CHRISTELLA Pacini, MD;  Location: [facility];  Service: General;  Laterality: Left;   RIGHT OOPHORECTOMY     ROTATOR CUFF REPAIR     right   TUBAL LIGATION     Family History  Problem Relation Age of Onset   Hypertension Father    Dementia Father    Cancer Neg Hx        Negative for breast or colon cancer   Heart disease Neg Hx    Social History   Socioeconomic History   Marital status: Widowed    Spouse name: Not on file   Number of children: 5   Years of education: 77   Highest education level: 12th grade  Occupational History   Occupation: RETIRED  Tobacco Use   Smoking status: Never   Smokeless tobacco: Never  Vaping Use   Vaping status: Never Used  Substance and Sexual Activity   Alcohol use: No   Drug use: No   Sexual activity: Not Currently  Other Topics Concern   Not on file  Social History Narrative   HSGMarried '613 sons- '62, '64, '65; 2 daughters- '60, '66: 5 grandchildren Support family-close at hand   Social Drivers of Corporate investment banker Strain: Medium Risk (09/29/2023)   Overall Financial Resource Strain (CARDIA)    Difficulty of Paying Living Expenses: Somewhat hard  Food Insecurity: Food Insecurity Present (09/29/2023)   Hunger Vital Sign    Worried About Running Out of Food in the Last Year: Sometimes true    Ran Out of Food in the Last Year: Sometimes true  Transportation Needs: Unknown (09/29/2023)   PRAPARE - Transportation    Lack of Transportation (Medical): No    Lack of Transportation (Non-Medical): Patient declined  Physical Activity: Inactive (09/29/2023)   Exercise Vital Sign    Days of Exercise per Week: 0 days    Minutes of Exercise per Session: 0 min  Stress: No Stress Concern Present (09/29/2023)   [employer] of Occupational Health - Occupational Stress Questionnaire    Feeling of Stress: Not at all  Social Connections: Moderately Integrated (09/29/2023)   Social Connection and Isolation Panel     Frequency of Communication with Friends and Family: More than three times a week    Frequency of Social Gatherings with Friends and Family: Three times a week    Attends Religious Services: More than 4 times per year    Active Member of Clubs or Organizations: Yes    Attends Banker Meetings: More than 4 times per year    Marital Status: Widowed    Tobacco Counseling Counseling given: Not Answered    Clinical Intake:  Pre-visit preparation completed: Yes  Pain : No/denies pain     BMI - recorded: 32.32 Nutritional Status: BMI > 30  Obese Nutritional Risks: None  Diabetes: No  Lab Results  Component Value Date   HGBA1C 6.1 06/25/2023   HGBA1C 6.1 12/25/2022   HGBA1C 6.2 12/09/2021     How often do you need to have someone help you when you read instructions, pamphlets, or other written materials from your doctor or pharmacy?: 5 - Always (legally blind)     Information entered by :: Braelyn Jenson, RMA   Activities of Daily Living     09/29/2023    2:19 PM  In your present state of health, do you have any difficulty performing the following activities:  Hearing? 0  Vision? 1  Difficulty concentrating or making decisions? 0  Walking or climbing stairs? 0  Doing errands, shopping? 1  Preparing Food and eating ? N  Using the Toilet? N  Do you have problems with loss of bowel control? N  Managing your Medications? N  Managing your Finances? N  Housekeeping or managing your Housekeeping? Y    Patient Care Team: Joshua Debby CROME, MD as PCP - General (Internal Medicine) Austin Debby, MD as Consulting Physician (Obstetrics and Gynecology) Magrinat, Sandria BROCKS, MD (Inactive) as Consulting Physician (Oncology) Gail Favorite, MD as Consulting Physician (General Surgery) Shannon Agent, MD as Consulting Physician (Radiation Oncology)  I have updated your Care Teams any recent Medical Services you may have received from other providers in the past year.      Assessment:   This is a routine wellness examination for [facility].  Hearing/Vision screen Hearing Screening - Comments:: Denies hearing difficulties   Vision Screening - Comments:: Legal blindness   Goals Addressed             This Visit's Progress    Patient Stated   On track    Maintain current health status.       Depression Screen     09/29/2023    3:06 PM 06/25/2023    9:06 AM 01/27/2022   11:09 AM 01/24/2021   11:47 AM 11/05/2020    8:46 AM 11/02/2018    4:46 PM 02/22/2018    4:10 PM  PHQ 2/9 Scores  PHQ - 2 Score 0 0 0 0 0 0 1  PHQ- 9 Score 0    0  2    Fall Risk     09/29/2023    2:19 PM 06/25/2023    9:06 AM 01/27/2022   11:12 AM 01/24/2021   11:54 AM 11/05/2020    8:42 AM  Fall Risk   Falls in the past year? 0 0 1 0 0  Number falls in past yr: 0 0 0 0 0  Injury with Fall? 0 0 0 0 0  Risk for fall due to :  Impaired vision History of fall(s);Impaired vision Impaired vision No Fall Risks  Follow up Falls evaluation completed;Falls prevention discussed Falls evaluation completed Falls evaluation completed  Falls evaluation completed  Falls evaluation completed      Data saved with a previous flowsheet row definition    MEDICARE RISK AT HOME:  Medicare Risk at Home Any stairs in or around the home?: (Patient-Rptd) No If so, are there any without handrails?: (Patient-Rptd) No Home free of loose throw rugs in walkways, pet beds, electrical cords, etc?: (Patient-Rptd) Yes Adequate lighting in your home to reduce risk of falls?: (Patient-Rptd) Yes Life alert?: (Patient-Rptd) No Use of a cane, walker or w/c?: (Patient-Rptd) No Grab bars in the bathroom?: (Patient-Rptd) Yes Shower chair or bench in shower?: (Patient-Rptd) No Elevated toilet seat or a  handicapped toilet?: (Patient-Rptd) No  TIMED UP AND GO:  Was the test performed?  No  Cognitive Function: 6CIT completed    02/22/2018    4:11 PM  MMSE - Mini Mental State Exam  Not completed: Unable to complete         09/29/2023    3:03 PM 01/27/2022   11:00 AM  6CIT Screen  What Year? 0 points 0 points  What month? 0 points 0 points  What time? 0 points 0 points  Count back from 20 0 points 0 points  Months in reverse 0 points 0 points  Repeat phrase 6 points 0 points  Total Score 6 points 0 points    Immunizations Immunization History  Administered Date(s) Administered   Fluad Quad(high Dose 65+) 12/31/2018, 01/24/2021, 12/09/2021   Influenza, High Dose Seasonal PF 02/20/2016, 02/04/2017, 02/22/2018   PFIZER(Purple Top)SARS-COV-2 Vaccination 06/08/2019, 01/05/2020   Pfizer Covid-19 Vaccine Bivalent Booster 62yrs & up 01/18/2021   Pneumococcal Conjugate-13 07/04/2014   Pneumococcal Polysaccharide-23 02/06/2009, 07/10/2015, 12/09/2021   Tdap 07/04/2014   Tetanus 06/30/2012    Screening Tests Health Maintenance  Topic Date Due   Zoster Vaccines- Shingrix  (1 of 2) Never done   COVID-19 Vaccine (4 - 2024-25 season) 11/23/2022   INFLUENZA VACCINE  10/23/2023   DTaP/Tdap/Td (2 - Td or Tdap) 07/03/2024   Medicare Annual Wellness (AWV)  09/28/2024   Pneumococcal Vaccine: 50+ Years  Completed   DEXA SCAN  Completed   Hepatitis B Vaccines  Aged Out   HPV VACCINES  Aged Out   Meningococcal B Vaccine  Aged Out    Health Maintenance  Health Maintenance Due  Topic Date Due   Zoster Vaccines- Shingrix  (1 of 2) Never done   COVID-19 Vaccine (4 - 2024-25 season) 11/23/2022   Health Maintenance Items Addressed: See Nurse Notes at the end of this note  Additional Screening:  Vision Screening: Recommended annual ophthalmology exams for early detection of glaucoma and other disorders of the eye. Would you like a referral to an eye doctor? No    Dental Screening: Recommended annual dental exams for proper oral hygiene  Community Resource Referral / Chronic Care Management: CRR required this visit?  No   CCM required this visit?  No   Plan:    I have personally reviewed and  noted the following in the patient's chart:   Medical and social history Use of alcohol, tobacco or illicit drugs  Current medications and supplements including opioid prescriptions. Patient is not currently taking opioid prescriptions. Functional ability and status Nutritional status Physical activity Advanced directives List of other physicians Hospitalizations, surgeries, and ER visits in previous 12 months Vitals Screenings to include cognitive, depression, and falls Referrals and appointments  In addition, I have reviewed and discussed with patient certain preventive protocols, quality metrics, and best practice recommendations. A written personalized care plan for preventive services as well as general preventive health recommendations were provided to patient.   Gadiel John L Antonieta Slaven, CMA   09/29/2023   After Visit Summary: (MyChart) Due to this being a telephonic visit, the after visit summary with patients personalized plan was offered to patient via MyChart   Notes: Patient is due a DEXA and order has been placed today.  She is due for a Shingrix  vaccine also.

## 2023-09-29 NOTE — Patient Instructions (Signed)
 [name] , Thank you for taking time out of your busy schedule to complete your Annual Wellness Visit with me. I enjoyed our conversation and look forward to speaking with you again next year. I, as well as your care team,  appreciate your ongoing commitment to your health goals. Please review the following plan we discussed and let me know if I can assist you in the future. Your Game plan/ To Do List    Referrals: If you haven't heard from the office you've been referred to, please reach out to them at the phone provided.  You have an order for:  [x]   Bone Density     Please call for appointment:  [facility] [address] [place] [address] [place], [place] [address] [phone]  Make sure to wear two-piece clothing.  No lotions, powders, or deodorants the day of the appointment. Make sure to bring picture ID and insurance card.  Bring list of medications you are currently taking including any supplements.   Follow up Visits: Next Medicare AWV with our clinical staff: 09/30/2024.   Have you seen your provider in the last 6 months (3 months if uncontrolled diabetes)? Yes Next Office Visit with your provider: Patient stated that she will call the office to scheduled her next office visit.  Last visit was on 06/25/2023.  Clinician Recommendations:  Aim for 30 minutes of exercise or brisk walking, 6-8 glasses of water, and 5 servings of fruits and vegetables each day. You are due for a Shingles vaccine.      This is a list of the screening recommended for you and due dates:  Health Maintenance  Topic Date Due   Zoster (Shingles) Vaccine (1 of 2) Never done   COVID-19 Vaccine (4 - 2024-25 season) 11/23/2022   Flu Shot  10/23/2023   DTaP/Tdap/Td vaccine (2 - Td or Tdap) 07/03/2024   Medicare Annual Wellness Visit  09/28/2024   Pneumococcal Vaccine for age over 59  Completed   DEXA scan (bone density measurement)  Completed   Hepatitis B Vaccine  Aged Out   HPV Vaccine  Aged Out    Meningitis B Vaccine  Aged Out    Advanced directives: (Copy Requested) Please bring a copy of your health care power of attorney and living will to the office to be added to your chart at your convenience. You can mail to [facility] [address] [place], [place] [address] or email to [email or web address] Advance Care Planning is important because it:  [x]  Makes sure you receive the medical care that is consistent with your values, goals, and preferences  [x]  It provides guidance to your family and loved ones and reduces their decisional burden about whether or not they are making the right decisions based on your wishes.  Follow the link provided in your after visit summary or read over the paperwork we have mailed to you to help you started getting your Advance Directives in place. If you need assistance in completing these, please reach out to us  so that we can help you!  See attachments for Preventive Care and Fall Prevention Tips.

## 2023-10-14 ENCOUNTER — Telehealth: Payer: Self-pay | Admitting: Pharmacist

## 2023-10-14 NOTE — Progress Notes (Signed)
 Pharmacy Quality Measure Review  This patient is appearing on a report for being at risk of failing the adherence measure for cholesterol (statin) medications this calendar year.   Medication: Pravastatin  40 mg Last fill date: 09/15/23 for 90 day supply  Insurance report was not up to date. No action needed at this time.   Darrelyn Drum, PharmD, BCPS, CPP Clinical Pharmacist Practitioner  Primary Care at [facility] [phone]

## 2024-01-28 ENCOUNTER — Other Ambulatory Visit: Payer: Self-pay | Admitting: Internal Medicine

## 2024-01-28 DIAGNOSIS — E785 Hyperlipidemia, unspecified: Secondary | ICD-10-CM

## 2024-02-02 DIAGNOSIS — Z6834 Body mass index (BMI) 34.0-34.9, adult: Secondary | ICD-10-CM | POA: Diagnosis not present

## 2024-02-02 DIAGNOSIS — M792 Neuralgia and neuritis, unspecified: Secondary | ICD-10-CM | POA: Diagnosis not present

## 2024-02-02 DIAGNOSIS — F419 Anxiety disorder, unspecified: Secondary | ICD-10-CM | POA: Diagnosis not present

## 2024-02-02 DIAGNOSIS — E669 Obesity, unspecified: Secondary | ICD-10-CM | POA: Diagnosis not present

## 2024-02-02 DIAGNOSIS — N189 Chronic kidney disease, unspecified: Secondary | ICD-10-CM | POA: Diagnosis not present

## 2024-02-02 DIAGNOSIS — E785 Hyperlipidemia, unspecified: Secondary | ICD-10-CM | POA: Diagnosis not present

## 2024-02-02 DIAGNOSIS — I130 Hypertensive heart and chronic kidney disease with heart failure and stage 1 through stage 4 chronic kidney disease, or unspecified chronic kidney disease: Secondary | ICD-10-CM | POA: Diagnosis not present

## 2024-02-02 DIAGNOSIS — Z853 Personal history of malignant neoplasm of breast: Secondary | ICD-10-CM | POA: Diagnosis not present

## 2024-02-02 DIAGNOSIS — F325 Major depressive disorder, single episode, in full remission: Secondary | ICD-10-CM | POA: Diagnosis not present

## 2024-02-02 DIAGNOSIS — H547 Unspecified visual loss: Secondary | ICD-10-CM | POA: Diagnosis not present

## 2024-02-02 DIAGNOSIS — Z5982 Transportation insecurity: Secondary | ICD-10-CM | POA: Diagnosis not present

## 2024-02-02 DIAGNOSIS — I2510 Atherosclerotic heart disease of native coronary artery without angina pectoris: Secondary | ICD-10-CM | POA: Diagnosis not present

## 2024-02-02 DIAGNOSIS — I700 Atherosclerosis of aorta: Secondary | ICD-10-CM | POA: Diagnosis not present

## 2024-02-02 DIAGNOSIS — M1990 Unspecified osteoarthritis, unspecified site: Secondary | ICD-10-CM | POA: Diagnosis not present

## 2024-02-02 DIAGNOSIS — I509 Heart failure, unspecified: Secondary | ICD-10-CM | POA: Diagnosis not present

## 2024-02-03 NOTE — Telephone Encounter (Signed)
 Pt daughter calling to follow up on this refill request status

## 2024-04-26 ENCOUNTER — Inpatient Hospital Stay: Admission: RE | Admit: 2024-04-26 | Source: Ambulatory Visit

## 2024-09-30 ENCOUNTER — Ambulatory Visit
